# Patient Record
Sex: Female | Born: 1955 | Race: White | Hispanic: No | Marital: Married | Smoking: Current every day smoker
Health system: Southern US, Community
[De-identification: ages and names within clinical notes are randomized; demographics above are authoritative.]

## PROBLEM LIST (undated history)

## (undated) DIAGNOSIS — J449 Chronic obstructive pulmonary disease, unspecified: Secondary | ICD-10-CM

## (undated) DIAGNOSIS — F319 Bipolar disorder, unspecified: Secondary | ICD-10-CM

## (undated) DIAGNOSIS — D751 Secondary polycythemia: Secondary | ICD-10-CM

## (undated) DIAGNOSIS — M5136 Other intervertebral disc degeneration, lumbar region: Secondary | ICD-10-CM

## (undated) DIAGNOSIS — M51369 Other intervertebral disc degeneration, lumbar region without mention of lumbar back pain or lower extremity pain: Secondary | ICD-10-CM

## (undated) DIAGNOSIS — J45909 Unspecified asthma, uncomplicated: Secondary | ICD-10-CM

## (undated) DIAGNOSIS — M419 Scoliosis, unspecified: Secondary | ICD-10-CM

## (undated) DIAGNOSIS — M5020 Other cervical disc displacement, unspecified cervical region: Secondary | ICD-10-CM

## (undated) HISTORY — PX: NECK SURGERY: SHX720

## (undated) HISTORY — DX: Other intervertebral disc degeneration, lumbar region without mention of lumbar back pain or lower extremity pain: M51.369

## (undated) HISTORY — PX: DILATION AND CURETTAGE OF UTERUS: SHX78

## (undated) HISTORY — DX: Scoliosis, unspecified: M41.9

## (undated) HISTORY — DX: Secondary polycythemia: D75.1

## (undated) HISTORY — DX: Other cervical disc displacement, unspecified cervical region: M50.20

## (undated) HISTORY — PX: BREAST EXCISIONAL BIOPSY: SUR124

## (undated) HISTORY — DX: Chronic obstructive pulmonary disease, unspecified: J44.9

## (undated) HISTORY — DX: Other intervertebral disc degeneration, lumbar region: M51.36

## (undated) HISTORY — DX: Unspecified asthma, uncomplicated: J45.909

---

## 2004-12-25 ENCOUNTER — Ambulatory Visit: Payer: Self-pay | Admitting: Internal Medicine

## 2005-08-05 ENCOUNTER — Ambulatory Visit: Payer: Self-pay | Admitting: Internal Medicine

## 2005-09-16 ENCOUNTER — Ambulatory Visit: Payer: Self-pay | Admitting: Internal Medicine

## 2006-01-16 ENCOUNTER — Ambulatory Visit: Payer: Self-pay | Admitting: Neurosurgery

## 2006-05-29 ENCOUNTER — Encounter: Admission: RE | Admit: 2006-05-29 | Discharge: 2006-05-29 | Payer: Self-pay | Admitting: Neurosurgery

## 2006-06-18 ENCOUNTER — Encounter: Admission: RE | Admit: 2006-06-18 | Discharge: 2006-06-18 | Payer: Self-pay | Admitting: Neurosurgery

## 2006-10-22 ENCOUNTER — Ambulatory Visit: Payer: Self-pay | Admitting: Internal Medicine

## 2006-10-29 ENCOUNTER — Encounter: Admission: RE | Admit: 2006-10-29 | Discharge: 2006-10-29 | Payer: Self-pay | Admitting: Neurosurgery

## 2006-11-03 ENCOUNTER — Ambulatory Visit: Payer: Self-pay | Admitting: Internal Medicine

## 2006-11-12 ENCOUNTER — Ambulatory Visit: Payer: Self-pay | Admitting: Surgery

## 2007-02-08 ENCOUNTER — Ambulatory Visit: Payer: Self-pay | Admitting: Surgery

## 2007-06-03 ENCOUNTER — Ambulatory Visit: Payer: Self-pay | Admitting: Gastroenterology

## 2007-07-07 ENCOUNTER — Ambulatory Visit: Payer: Self-pay | Admitting: Gastroenterology

## 2007-10-07 ENCOUNTER — Ambulatory Visit: Payer: Self-pay | Admitting: Internal Medicine

## 2007-11-08 ENCOUNTER — Ambulatory Visit: Payer: Self-pay | Admitting: Internal Medicine

## 2007-12-02 ENCOUNTER — Ambulatory Visit: Payer: Self-pay | Admitting: Internal Medicine

## 2008-11-20 ENCOUNTER — Ambulatory Visit: Payer: Self-pay | Admitting: Internal Medicine

## 2008-12-05 ENCOUNTER — Ambulatory Visit: Payer: Self-pay | Admitting: Internal Medicine

## 2009-01-11 ENCOUNTER — Ambulatory Visit: Payer: Self-pay | Admitting: Surgery

## 2010-02-05 ENCOUNTER — Ambulatory Visit: Payer: Self-pay | Admitting: Internal Medicine

## 2010-03-05 ENCOUNTER — Ambulatory Visit: Payer: Self-pay | Admitting: Internal Medicine

## 2010-10-14 ENCOUNTER — Ambulatory Visit: Payer: Self-pay | Admitting: Internal Medicine

## 2011-03-20 ENCOUNTER — Ambulatory Visit: Payer: Self-pay | Admitting: Internal Medicine

## 2011-05-22 ENCOUNTER — Ambulatory Visit: Payer: Self-pay | Admitting: Internal Medicine

## 2012-07-06 ENCOUNTER — Ambulatory Visit: Payer: Self-pay | Admitting: Internal Medicine

## 2014-01-26 ENCOUNTER — Ambulatory Visit: Payer: Self-pay | Admitting: Internal Medicine

## 2014-01-31 ENCOUNTER — Ambulatory Visit: Payer: Self-pay | Admitting: Internal Medicine

## 2014-02-01 ENCOUNTER — Ambulatory Visit: Payer: Self-pay | Admitting: Internal Medicine

## 2014-06-02 ENCOUNTER — Ambulatory Visit: Payer: Self-pay | Admitting: Internal Medicine

## 2015-02-22 ENCOUNTER — Ambulatory Visit: Payer: Self-pay | Admitting: Internal Medicine

## 2015-09-05 ENCOUNTER — Other Ambulatory Visit: Payer: Self-pay | Admitting: Physical Medicine and Rehabilitation

## 2015-09-05 DIAGNOSIS — M5412 Radiculopathy, cervical region: Secondary | ICD-10-CM

## 2015-09-08 ENCOUNTER — Ambulatory Visit
Admission: RE | Admit: 2015-09-08 | Discharge: 2015-09-08 | Disposition: A | Discharge status: Home / Self Care | Payer: BLUE CROSS/BLUE SHIELD | Source: Ambulatory Visit | Attending: Physical Medicine and Rehabilitation | Admitting: Physical Medicine and Rehabilitation

## 2015-09-08 DIAGNOSIS — M4802 Spinal stenosis, cervical region: Secondary | ICD-10-CM | POA: Insufficient documentation

## 2015-09-08 DIAGNOSIS — M5412 Radiculopathy, cervical region: Secondary | ICD-10-CM

## 2015-09-08 DIAGNOSIS — G935 Compression of brain: Secondary | ICD-10-CM | POA: Insufficient documentation

## 2015-09-08 DIAGNOSIS — M5032 Other cervical disc degeneration, mid-cervical region: Secondary | ICD-10-CM | POA: Diagnosis not present

## 2015-09-24 DIAGNOSIS — M5030 Other cervical disc degeneration, unspecified cervical region: Secondary | ICD-10-CM | POA: Insufficient documentation

## 2015-09-24 DIAGNOSIS — M4802 Spinal stenosis, cervical region: Secondary | ICD-10-CM | POA: Insufficient documentation

## 2016-07-02 ENCOUNTER — Other Ambulatory Visit: Payer: Self-pay | Admitting: Internal Medicine

## 2016-07-02 DIAGNOSIS — Z1231 Encounter for screening mammogram for malignant neoplasm of breast: Secondary | ICD-10-CM

## 2016-07-16 ENCOUNTER — Ambulatory Visit: Payer: BLUE CROSS/BLUE SHIELD

## 2016-07-22 ENCOUNTER — Ambulatory Visit
Admission: RE | Admit: 2016-07-22 | Discharge: 2016-07-22 | Disposition: A | Discharge status: Home / Self Care | Payer: BLUE CROSS/BLUE SHIELD | Source: Ambulatory Visit | Attending: Internal Medicine | Admitting: Internal Medicine

## 2016-07-22 ENCOUNTER — Other Ambulatory Visit: Payer: Self-pay | Admitting: Internal Medicine

## 2016-07-22 DIAGNOSIS — Z1231 Encounter for screening mammogram for malignant neoplasm of breast: Secondary | ICD-10-CM | POA: Insufficient documentation

## 2016-10-30 ENCOUNTER — Ambulatory Visit (INDEPENDENT_AMBULATORY_CARE_PROVIDER_SITE_OTHER): Payer: BLUE CROSS/BLUE SHIELD | Admitting: Vascular Surgery

## 2016-11-03 ENCOUNTER — Ambulatory Visit (INDEPENDENT_AMBULATORY_CARE_PROVIDER_SITE_OTHER): Payer: BLUE CROSS/BLUE SHIELD | Admitting: Vascular Surgery

## 2016-11-03 ENCOUNTER — Encounter (INDEPENDENT_AMBULATORY_CARE_PROVIDER_SITE_OTHER): Payer: Self-pay | Admitting: Vascular Surgery

## 2016-11-03 DIAGNOSIS — J431 Panlobular emphysema: Secondary | ICD-10-CM | POA: Diagnosis not present

## 2016-11-03 DIAGNOSIS — I872 Venous insufficiency (chronic) (peripheral): Secondary | ICD-10-CM | POA: Insufficient documentation

## 2016-11-03 DIAGNOSIS — J449 Chronic obstructive pulmonary disease, unspecified: Secondary | ICD-10-CM

## 2016-11-03 DIAGNOSIS — I83811 Varicose veins of right lower extremities with pain: Secondary | ICD-10-CM | POA: Diagnosis not present

## 2016-11-03 DIAGNOSIS — I83819 Varicose veins of unspecified lower extremities with pain: Secondary | ICD-10-CM | POA: Insufficient documentation

## 2016-11-03 HISTORY — DX: Chronic obstructive pulmonary disease, unspecified: J44.9

## 2016-11-03 NOTE — Progress Notes (Signed)
[facility] Admission History & Physical  MRN : [id number]  Yvonne Pitts is a 60 y.o. (02/16/1956) female who presents with chief complaint of  Chief Complaint  Patient presents with  . Follow-up  .  History of Present Illness: The patient returns for followup evaluation 3 months after the initial visit. The patient continues to have pain in the lower extremities with dependency. The pain is lessened with elevation. Graduated compression stockings, Class I (20-30 mmHg), have been worn but the stockings do not eliminate the leg pain. Over-the-counter analgesics do not improve the symptoms. The degree of discomfort continues to interfere with daily activities. The patient notes the pain in the legs is causing problems with daily exercise, at the workplace and even with household activities and maintenance such as standing in the kitchen preparing meals and doing dishes.   Venous ultrasound shows normal deep venous system, no evidence of acute or chronic DVT.  Superficial reflux is present in a large posterior vein that appear to connect to the small saphenous vein on the right  Current Meds  Medication Sig  . acetaminophen (TYLENOL) 500 MG tablet Take by mouth.  . ADVAIR DISKUS 250-50 MCG/DOSE AEPB   . fluticasone (FLONASE) 50 MCG/ACT nasal spray Place into the nose.  Marland Kitchen Ipratropium-Albuterol (COMBIVENT) 20-100 MCG/ACT AERS respimat Inhale into the lungs.  . montelukast (SINGULAIR) 10 MG tablet Take 10 mg by mouth at bedtime.  . montelukast (SINGULAIR) 10 MG tablet TAKE 1 TABLET DAILY  . naproxen sodium (ANAPROX) 220 MG tablet Take by mouth.  Marland Kitchen omeprazole (PRILOSEC) 20 MG capsule Take by mouth.  . Vitamin D, Ergocalciferol, (DRISDOL) 50000 units CAPS capsule Take by mouth.    Past Medical History:  Diagnosis Date  . Asthma   . COPD (chronic obstructive pulmonary disease) ([place]) 11/03/2016    Past Surgical History:  Procedure Laterality Date  . BREAST BIOPSY  Right years ago   x 2  . DILATION AND CURETTAGE OF UTERUS    . NECK SURGERY      Social History Social History  Substance Use Topics  . Smoking status: Current Every Day Smoker  . Smokeless tobacco: Never Used  . Alcohol use No    Family History Family History  Problem Relation Age of Onset  . Breast cancer Maternal Aunt 40  No family history of bleeding/clotting disorders, porphyria or autoimmune disease   Allergies  Allergen Reactions  . Prochlorperazine Maleate     Other reaction(s): Unknown  . Ibuprofen Rash     REVIEW OF SYSTEMS (Negative unless checked)  Constitutional: [] Weight loss  [] Fever  [] Chills Cardiac: [] Chest pain   [] Chest pressure   [] Palpitations   [] Shortness of breath when laying flat   [] Shortness of breath with exertion. Vascular:  [x] Pain in legs with walking   [] Pain in legs at rest  [] History of DVT   [] Phlebitis   [x] Swelling in legs   [x] Varicose veins   [] Non-healing ulcers Pulmonary:   [] Uses home oxygen   [] Productive cough   [] Hemoptysis   [] Wheeze  [] COPD   [] Asthma Neurologic:  [] Dizziness   [] Seizures   [] History of stroke   [] History of TIA  [] Aphasia   [] Vissual changes   [] Weakness or numbness in arm   [] Weakness or numbness in leg Musculoskeletal:   [] Joint swelling   [] Joint pain   [] Low back pain Hematologic:  [] Easy bruising  [] Easy bleeding   [] Hypercoagulable state   [] Anemic Gastrointestinal:  [] Diarrhea   []   Vomiting  [] Gastroesophageal reflux/heartburn   [] Difficulty swallowing. Genitourinary:  [] Chronic kidney disease   [] Difficult urination  [] Frequent urination   [] Blood in urine Skin:  [] Rashes   [] Ulcers  Psychological:  [] History of anxiety   []  History of major depression.  Physical Examination  Vitals:   11/03/16 1500  BP: 116/66  Pulse: 81  Resp: 17  Weight: 157 lb (71.2 kg)  Height: 5\' 5"  (1.651 m)   Body mass index is 26.13 kg/m. Gen: WD/WN, NAD Head: [place]/AT, No temporalis wasting.  Ear/Nose/Throat:  Hearing grossly intact, nares w/o erythema or drainage, poor dentition Eyes: PER, EOMI, sclera nonicteric.  Neck: Supple, no masses.  No bruit or JVD.  Pulmonary:  Good air movement, clear to auscultation bilaterally, no use of accessory muscles.  Cardiac: RRR, normal S1, S2, no Murmurs. Vascular: large >1.0 mm varicosity extending from the popliteal fossa to the gluteal fold.  Moderate venous stasis changes of the the right ankle area 2+ edema bilaterally. Vessel Right Left  Radial Palpable Palpable  Ulnar Palpable Palpable  Brachial Palpable Palpable  Carotid Palpable Palpable  Femoral Palpable Palpable  Popliteal Palpable Palpable  PT Palpable Palpable  DP Palpable Palpable   Gastrointestinal: soft, non-distended. No guarding/no peritoneal signs.  Musculoskeletal: M/S 5/5 throughout.  No deformity or atrophy.  Neurologic: CN 2-12 intact. Pain and light touch intact in extremities.  Symmetrical.  Speech is fluent. Motor exam as listed above. Psychiatric: Judgment intact, Mood & affect appropriate for pt's clinical situation. Dermatologic: No rashes or ulcers noted.  No changes consistent with cellulitis. Lymph : No Cervical lymphadenopathy, no lichenification or skin changes of chronic lymphedema.  CBC No results found for: WBC, HGB, HCT, MCV, PLT  BMET No results found for: NA, K, CL, CO2, GLUCOSE, BUN, CREATININE, CALCIUM, GFRNONAA, GFRAA CrCl cannot be calculated (No order found.).  COAG No results found for: INR, PROTIME  Radiology No results found.  Assessment/Plan 1. Varicose veins of right lower extremity with pain Recommend  I have reviewed my previous  discussion with the patient regarding  varicose veins and why they cause symptoms. Patient will continue  wearing graduated compression stockings class 1 on a daily basis, beginning first thing in the morning and removing them in the evening.    In addition, behavioral modification including elevation during the day  was again discussed and this will continue.  The patient has utilized over the counter pain medications and has been exercising.  However, at this time conservative therapy has not alleviated the patient's symptoms of leg pain and swelling  Recommend: laser ablation of the right posterior varicose vein to eliminate the symptoms of pain and swelling of the lower extremities caused by the severe superficial venous reflux disease.   2. Chronic venous insufficiency contnue compression  3. Panlobular emphysema ([place]) Continue aerosol medications as already ordered and reviewed, no changes at this time.     Hortencia Pilar, MD  11/03/2016 5:06 PM

## 2017-09-01 ENCOUNTER — Other Ambulatory Visit: Payer: Self-pay | Admitting: Internal Medicine

## 2017-09-01 DIAGNOSIS — Z1231 Encounter for screening mammogram for malignant neoplasm of breast: Secondary | ICD-10-CM

## 2017-10-01 ENCOUNTER — Ambulatory Visit
Admission: RE | Admit: 2017-10-01 | Discharge: 2017-10-01 | Disposition: A | Discharge status: Home / Self Care | Payer: BLUE CROSS/BLUE SHIELD | Source: Ambulatory Visit | Attending: Internal Medicine | Admitting: Internal Medicine

## 2017-10-01 DIAGNOSIS — Z1231 Encounter for screening mammogram for malignant neoplasm of breast: Secondary | ICD-10-CM | POA: Insufficient documentation

## 2018-10-19 ENCOUNTER — Telehealth: Payer: Self-pay | Admitting: *Deleted

## 2018-10-19 ENCOUNTER — Encounter: Payer: Self-pay | Admitting: *Deleted

## 2018-10-19 DIAGNOSIS — Z122 Encounter for screening for malignant neoplasm of respiratory organs: Secondary | ICD-10-CM

## 2018-10-19 NOTE — Telephone Encounter (Signed)
Received a referral for initial lung cancer screening scan.  Contacted the patient and obtained their smoking history, current smoker 1.75-2ppd with 75.25pkyr history   as well as answering questions related to screening process.  Patient denies signs of lung cancer such as weight loss or hemoptysis at this time.  Patient denies comorbidity that would prevent curative treatment if lung cancer were found.  Patient is scheduled for the Shared Decision Making Visit and CT scan on 11-18-18@1330  .

## 2018-11-18 ENCOUNTER — Inpatient Hospital Stay: Payer: BLUE CROSS/BLUE SHIELD | Attending: Nurse Practitioner | Admitting: Nurse Practitioner

## 2018-11-18 ENCOUNTER — Ambulatory Visit
Admission: RE | Admit: 2018-11-18 | Discharge: 2018-11-18 | Disposition: A | Discharge status: Home / Self Care | Payer: BLUE CROSS/BLUE SHIELD | Source: Ambulatory Visit | Attending: Nurse Practitioner | Admitting: Nurse Practitioner

## 2018-11-18 DIAGNOSIS — J432 Centrilobular emphysema: Secondary | ICD-10-CM | POA: Diagnosis not present

## 2018-11-18 DIAGNOSIS — F17210 Nicotine dependence, cigarettes, uncomplicated: Secondary | ICD-10-CM | POA: Diagnosis not present

## 2018-11-18 DIAGNOSIS — Z122 Encounter for screening for malignant neoplasm of respiratory organs: Secondary | ICD-10-CM | POA: Insufficient documentation

## 2018-11-18 DIAGNOSIS — I700 Atherosclerosis of aorta: Secondary | ICD-10-CM | POA: Diagnosis not present

## 2018-11-18 DIAGNOSIS — R918 Other nonspecific abnormal finding of lung field: Secondary | ICD-10-CM | POA: Insufficient documentation

## 2018-11-18 DIAGNOSIS — J9819 Other pulmonary collapse: Secondary | ICD-10-CM | POA: Insufficient documentation

## 2018-11-18 DIAGNOSIS — J438 Other emphysema: Secondary | ICD-10-CM | POA: Insufficient documentation

## 2018-11-18 DIAGNOSIS — Z87891 Personal history of nicotine dependence: Secondary | ICD-10-CM

## 2018-11-18 NOTE — Progress Notes (Signed)
In accordance with CMS guidelines, patient has met eligibility criteria including age, absence of signs or symptoms of lung cancer.  Social History   Tobacco Use  . Smoking status: Current Every Day Smoker    Packs/day: 1.75    Years: 43.00    Pack years: 75.25  . Smokeless tobacco: Never Used  Substance Use Topics  . Alcohol use: No  . Drug use: No      A shared decision-making session was conducted prior to the performance of CT scan. This includes one or more decision aids, includes benefits and harms of screening, follow-up diagnostic testing, over-diagnosis, false positive rate, and total radiation exposure.   Counseling on the importance of adherence to annual lung cancer LDCT screening, impact of co-morbidities, and ability or willingness to undergo diagnosis and treatment is imperative for compliance of the program.   Counseling on the importance of continued smoking cessation for former smokers; the importance of smoking cessation for current smokers, and information about tobacco cessation interventions have been given to patient including [place] and [facility] quit [place] programs.   Written order for lung cancer screening with LDCT has been given to the patient and any and all questions have been answered to the best of my abilities.    Yearly follow up will be coordinated by Burgess Estelle, Thoracic Navigator.  Beckey Rutter, DNP, AGNP-C [place] [facility] [phone] (work cell) [phone] (office) 11/18/18 2:54 PM

## 2018-11-19 ENCOUNTER — Ambulatory Visit
Admission: RE | Admit: 2018-11-19 | Discharge: 2018-11-19 | Disposition: A | Discharge status: Home / Self Care | Payer: BLUE CROSS/BLUE SHIELD | Source: Ambulatory Visit | Attending: Nurse Practitioner | Admitting: Nurse Practitioner

## 2018-11-19 ENCOUNTER — Other Ambulatory Visit: Payer: Self-pay | Admitting: Nurse Practitioner

## 2018-11-19 DIAGNOSIS — J939 Pneumothorax, unspecified: Secondary | ICD-10-CM

## 2018-11-19 LAB — POCT I-STAT CREATININE: CREATININE: 0.6 mg/dL (ref 0.44–1.00)

## 2018-11-19 MED ORDER — IOPAMIDOL (ISOVUE-300) INJECTION 61%
75.0000 mL | Freq: Once | INTRAVENOUS | Status: AC | PRN
  Administered 2018-11-19: 65 mL via INTRAVENOUS

## 2018-11-19 NOTE — Progress Notes (Signed)
Patient seen in clinic on 11/18/18 for low dose ct lung screening which was reported as:   IMPRESSION: 1. Lung-RADS 0, incomplete. Right middle lobe cannot be assessed due to complete right middle lobe collapse. Right middle lobe bronchus is obstructed. Immediate follow-up chest CT with contrast recommended to assess for central obstructing lesion. Repeat lung cancer screening CT could be performed if therapy to re-expand right middle lobe is successful. 2. Areas of small airway impaction in the right upper and both lower lobes. The right lower lobe disease is associated with patchy ground-glass and nodular airspace disease. This may be infectious/inflammatory and in the appropriate clinical setting, aspiration could have this appearance. 3.  Aortic Atherosclerois (ICD10-170.0) 4.  Emphysema. (CYE18-H90.9)  Dr. Linton Ham office notified of finding at that given that Dr. Ginette Pitman is out of town I've placed order for CT Chest w/ contrast. Burgess Estelle working on Government social research officer. Kidney function to be checked at imaging center per protocol prior to scan. I'll call patient to discuss finding and recommended follow-up.

## 2018-11-22 ENCOUNTER — Telehealth: Payer: Self-pay | Admitting: *Deleted

## 2018-11-22 NOTE — Telephone Encounter (Signed)
Left message at PCP office for PCP or covering provider to review CT scan of chest and advise if PCP or [place] provider will make pulmonology referral.

## 2018-11-23 ENCOUNTER — Other Ambulatory Visit: Payer: Self-pay | Admitting: Nurse Practitioner

## 2018-11-23 ENCOUNTER — Telehealth: Payer: Self-pay | Admitting: *Deleted

## 2018-11-23 DIAGNOSIS — J9819 Other pulmonary collapse: Secondary | ICD-10-CM

## 2018-11-23 NOTE — Telephone Encounter (Signed)
Spoke with Sharyn Lull, at [facility] who is working with Nani Gasser, covering provider for Dr. Ginette Pitman. They would like for [place] providers to make referral to pulmonology and are aware that will be [facility] practice with anticipated need for interventional pulmonology.

## 2018-11-23 NOTE — Progress Notes (Signed)
Referral to pulmonology placed for evaluation and possible bronchoscopy.

## 2018-11-24 ENCOUNTER — Telehealth: Payer: Self-pay

## 2018-11-24 NOTE — Telephone Encounter (Signed)
LM for patient to call and schedule apt with Dr. Patsey Berthold per cancer center.

## 2018-11-24 NOTE — Telephone Encounter (Signed)
Spoke with patient, scheduled apt.

## 2018-11-30 ENCOUNTER — Encounter: Payer: Self-pay | Admitting: Pulmonary Disease

## 2018-11-30 ENCOUNTER — Ambulatory Visit (INDEPENDENT_AMBULATORY_CARE_PROVIDER_SITE_OTHER): Payer: BLUE CROSS/BLUE SHIELD | Admitting: Pulmonary Disease

## 2018-11-30 VITALS — BP 130/80 | HR 67 | Ht 64.0 in | Wt 131.4 lb

## 2018-11-30 DIAGNOSIS — J9811 Atelectasis: Secondary | ICD-10-CM | POA: Diagnosis not present

## 2018-11-30 DIAGNOSIS — F4321 Adjustment disorder with depressed mood: Secondary | ICD-10-CM | POA: Diagnosis not present

## 2018-11-30 DIAGNOSIS — F17210 Nicotine dependence, cigarettes, uncomplicated: Secondary | ICD-10-CM | POA: Diagnosis not present

## 2018-11-30 DIAGNOSIS — J449 Chronic obstructive pulmonary disease, unspecified: Secondary | ICD-10-CM

## 2018-11-30 DIAGNOSIS — Z716 Tobacco abuse counseling: Secondary | ICD-10-CM

## 2018-11-30 MED ORDER — FLUTTER DEVI: 0 refills | Status: DC

## 2018-11-30 NOTE — Patient Instructions (Signed)
1) Use flutter valve 3 to 4 times a day.  2) Rinse mouth well after Advair use.  3) You will be scheduled for breathing test.  4) You will be scheduled for a chest x-ray on your day of follow-up.  We will see you in follow-up in 3 to 4 weeks time.

## 2018-11-30 NOTE — Progress Notes (Signed)
Subjective:    Patient ID: Yvonne Pitts, female    DOB: 1956/04/20, 62 y.o.   MRN: [id number]  HPI The patient is a 62 year old current smoker who presents for evaluation of a abnormality noted on lung cancer screening chest CT.  She is referred by the lung cancer screening program.  Her primary care physician is Dr. Ginette Pitman.  The patient underwent lung cancer screening CT on 18 November 2018.  That was followed by a CT with contrast the following day due to right middle lobe atelectasis and collapse.  There appears to be mucus material obstructing.  No distinct lesion could be seen.  I have reviewed both films independently.  The patient did not exhibit adenopathy.  There is significant emphysema (centrilobular) noted on the films.  The patient has noted cough which has been productive for "years".  She notes that the sputum can be tenacious at times and at times shows mucous plugs.  The sputum can also be green in color at times.  She has never noticed hemoptysis.  She states that she has [facility] evaluated [facility] but she cannot [facility] who.  This was many years ago when she was started on Advair at that time.  She uses Combivent as rescue.  She feels that the medications help her with her cough and dyspnea.  She is aware that smoking aggravates her symptoms but she feels that she cannot quit this habit.  She does not report any weight loss or anorexia.  She used to have issues with gastroesophageal reflux but these are better with the use of Prilosec and taking a fiber supplement.  Sometimes feels that she chokes while eating but has not had true dysphagia.  This is not a common occurrence.  She states that she always keeps a sore throat but does admit that she does not rinse her mouth well after use of Advair.  I have reviewed the patient's past medical history, surgical history and family history.  As noted she is a smoker she has smoked since age 76  anywhere between 1 to 2 packs of cigarettes per day and continues to do so.  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  She has a facial needs son who has autism and she tends to him which causes a great deal of stress.  She does feel overwhelmed, anxious and depressed many times.   At present she would want to defer any potential bronchoscopic interventions until after the holidays.   Review of Systems  Constitutional: Negative.   HENT: Positive for postnasal drip, sinus pressure, sore throat and trouble swallowing.   Eyes: Negative.   Respiratory: Positive for cough and shortness of breath.   Cardiovascular: Positive for palpitations.  Gastrointestinal:       Gastroesophageal reflux symptoms controlled with Prilosec and fiber supplement.  Endocrine: Negative.   Genitourinary: Negative.   Musculoskeletal: Positive for joint swelling.  Skin: Negative.   Allergic/Immunologic: Negative.   Neurological: Negative.   Hematological: Negative.   Psychiatric/Behavioral: Negative.   All other systems reviewed and are negative.      Objective:   Physical Exam  Constitutional: She is oriented to person, place, and time. She appears well-developed and well-nourished. No distress.  She appears older than stated age.  HENT:  Head: Normocephalic and atraumatic.  Right Ear: External ear normal.  Left Ear: External  ear normal.  Poor dentition.  Eyes: Pupils are equal, round, and reactive to light. Conjunctivae and EOM are normal. No scleral icterus.  Neck: Neck supple. No JVD present. No tracheal deviation present. No thyromegaly present.  Cardiovascular: Normal rate, regular rhythm, normal heart sounds and intact distal pulses.  Pulmonary/Chest: Effort normal. No accessory muscle usage. She has decreased breath sounds (Diffusely diminished breath sounds throughout). She has no wheezes. She has no rales.  Abdominal:  Soft. Bowel sounds are normal. She exhibits no distension.  Musculoskeletal: Normal range of motion. She exhibits no edema.  Lymphadenopathy:    She has no cervical adenopathy.  Neurological: She is alert and oriented to person, place, and time.  No focal deficits.  Skin: Skin is warm and dry. No rash noted. She is not diaphoretic. No erythema. No pallor.  Psychiatric: Her speech is normal. Thought content normal. Her affect is labile. Cognition and memory are normal. She exhibits a depressed mood.  Nursing note and vitals reviewed.   I reviewed the available radiographic films independently and reviewed them also with the patient.     Assessment & Plan:   1.  COPD with chronic bronchitis and emphysema: The patient describes findings consistent with chronic bronchitis that is production of mucopurulent sputum.  She also has a prior indication of potential asthma/COPD overlap which can lead to mucus plugging.  By her description sometimes her sputum shows plugs in it.  The findings noted on CT scan appear to be related to potential mucus plugging.  We will proceed with providing her an Acapella flutter valve with instructions to use 3-4 times a day to help with mucociliary clearance.  He was instructed on the proper use of her inhalers including rinsing well after use of Advair.  She was also instructed on curtailing her tobacco use and engage in smoking cessation as this will help with secretion clearance.  We will see her in follow-up in 4 weeks time at her request (did not want to follow sooner) with a chest x-ray at that time.  If there is still evidence of atelectasis on the right middle lobe we will proceed with bronchoscopy, she is aware of this.  2.  Atelectasis of the right lung: The patient has right middle lobe collapse related to apparent mucous plugging.  We will need to continue to follow this as potentially central obstructing lesion cannot be ruled out.  As noted we will follow-up  with chest x-ray and if the atelectasis persists, we will proceed with bronchoscopy at that time.  At the patient's request we will do this after the first of the year.  3.  Tobacco dependence due to cigarettes: The patient was counseled regards to discontinuation of smoking.  She cites that she has tried numerous modalities to stop smoking however she feels overwhelmed by her current stressors in her life and at present has no motivation to quit.  Total counseling time 3 to 5 minutes.  4.  Situational depression: The patient significant stressors in her life that caused her to be depressed.  Her mood was somewhat labile in today.  She may benefit from antidepressant this in turn may help with having the patient engage in smoking cessation.  I will leave this at the discretion of her primary care physician.  Given the issues with depression I have recommended that she discontinue use of montelukast which can aggravate depression.  Thank you for allowing me to participate in this patient's care.

## 2018-12-23 ENCOUNTER — Ambulatory Visit: Payer: BLUE CROSS/BLUE SHIELD | Admitting: Pulmonary Disease

## 2018-12-23 ENCOUNTER — Ambulatory Visit: Payer: BLUE CROSS/BLUE SHIELD | Attending: Pulmonary Disease

## 2018-12-23 DIAGNOSIS — J449 Chronic obstructive pulmonary disease, unspecified: Secondary | ICD-10-CM | POA: Diagnosis present

## 2018-12-23 DIAGNOSIS — J9811 Atelectasis: Secondary | ICD-10-CM | POA: Insufficient documentation

## 2018-12-23 MED ORDER — ALBUTEROL SULFATE (2.5 MG/3ML) 0.083% IN NEBU
2.5000 mg | INHALATION_SOLUTION | Freq: Once | RESPIRATORY_TRACT | Status: AC
  Administered 2018-12-23: 2.5 mg via RESPIRATORY_TRACT
  Filled 2018-12-23: qty 3

## 2018-12-31 ENCOUNTER — Ambulatory Visit
Admission: RE | Admit: 2018-12-31 | Discharge: 2018-12-31 | Disposition: A | Discharge status: Home / Self Care | Payer: BLUE CROSS/BLUE SHIELD | Source: Ambulatory Visit | Attending: Pulmonary Disease | Admitting: Pulmonary Disease

## 2018-12-31 ENCOUNTER — Encounter: Payer: Self-pay | Admitting: Pulmonary Disease

## 2018-12-31 ENCOUNTER — Ambulatory Visit
Admission: RE | Admit: 2018-12-31 | Discharge: 2018-12-31 | Disposition: A | Discharge status: Home / Self Care | Payer: BLUE CROSS/BLUE SHIELD | Attending: Pulmonary Disease | Admitting: Pulmonary Disease

## 2018-12-31 ENCOUNTER — Ambulatory Visit (INDEPENDENT_AMBULATORY_CARE_PROVIDER_SITE_OTHER): Payer: BLUE CROSS/BLUE SHIELD | Admitting: Pulmonary Disease

## 2018-12-31 VITALS — BP 130/80 | HR 70 | Ht 64.0 in | Wt 131.0 lb

## 2018-12-31 DIAGNOSIS — J9811 Atelectasis: Secondary | ICD-10-CM | POA: Diagnosis not present

## 2018-12-31 DIAGNOSIS — J449 Chronic obstructive pulmonary disease, unspecified: Secondary | ICD-10-CM

## 2018-12-31 DIAGNOSIS — F4321 Adjustment disorder with depressed mood: Secondary | ICD-10-CM | POA: Diagnosis not present

## 2018-12-31 DIAGNOSIS — F17210 Nicotine dependence, cigarettes, uncomplicated: Secondary | ICD-10-CM | POA: Diagnosis not present

## 2018-12-31 MED ORDER — ALBUTEROL SULFATE 108 (90 BASE) MCG/ACT IN AEPB: 2.0000 | INHALATION_SPRAY | Freq: Four times a day (QID) | RESPIRATORY_TRACT | 3 refills | Status: AC | PRN

## 2018-12-31 MED ORDER — FLUTICASONE-UMECLIDIN-VILANT 100-62.5-25 MCG/INH IN AEPB: 1.0000 | INHALATION_SPRAY | Freq: Every day | RESPIRATORY_TRACT | 3 refills | Status: DC

## 2018-12-31 MED ORDER — FLUTICASONE-UMECLIDIN-VILANT 100-62.5-25 MCG/INH IN AEPB: 1.0000 | INHALATION_SPRAY | Freq: Every day | RESPIRATORY_TRACT | 0 refills | Status: DC

## 2018-12-31 NOTE — Progress Notes (Signed)
Subjective:    Patient ID: Yvonne Pitts, female    DOB: February 07, 1956, 63 y.o.   MRN: [id number]  HPI The patient is a 63 year old current smoker (1 PPD) who presents for follow-up of a abnormality noted on lung cancer screening chest CT. The patient underwent lung cancer screening CT on 18 November 2018.  That was followed by a CT with contrast the following day due to right middle lobe atelectasis and collapse.  She was evaluated here on 3 December for these issues.  Her chest CTs were reviewed and showed mucus material obstructing the right middle lobe bronchus. The patient did not exhibit adenopathy.  There is significant emphysema (centrilobular) noted on the films.  The patient has noted cough which has been productive for "years".  She notes that the sputum can be tenacious at times and at times shows mucous plugs.  The sputum can also be green in color at times.  She has never noticed hemoptysis.  She states that she has been evaluated by a pulmonologist previously through the [facility] but she cannot remember who. She is aware that smoking aggravates her symptoms but she feels that she cannot quit this habit.  She does not report any weight loss or anorexia.  She used to have issues with gastroesophageal reflux but these are better with the use of Prilosec and taking a fiber supplement.  Sometimes feels that she chokes while eating but has not had true dysphagia.  This is not a common occurrence.    At our initial evaluation we placed her on a flutter valve to help with mucociliary clearance and requested that she follow-up with a chest x-ray today which she had performed.  The chest x-ray is entirely clear.  The atelectasis on the right middle lobe has resolved.  The patient has stopped using the flutter valve and has noted mucus building up again.  She had pulmonary function testing performed on 26 December that is consistent with moderate airway obstruction, no significant bronchodilator  response mild hyperinflation and mild diffusion capacity impairment.  This is consistent with mild to moderate COPD.  Suspect she has mixed physiology of chronic bronchitis and emphysema.  With regards to her Advair and Combivent, she feels that the Advair is "okay" but feels that the medication could be stronger.  She is cognizant she needs to quit smoking but as noted above she feels she cannot do this at present.  Review of Systems  Constitutional: Negative.   HENT: Negative.   Eyes: Negative.   Respiratory: Positive for cough.   Cardiovascular: Negative.   Gastrointestinal: Negative.   Endocrine: Negative.   Genitourinary: Negative.   Musculoskeletal: Negative.   Skin: Negative.   Allergic/Immunologic: Negative.   Neurological: Negative.   Hematological: Negative.   Psychiatric/Behavioral: Negative.   All other systems reviewed and are negative.      Objective:   Physical Exam Constitutional:      General: She is not in acute distress.    Appearance: She is well-developed and normal weight. She is not ill-appearing.  HENT:     Head: Normocephalic and atraumatic.  Eyes:     Extraocular Movements: Extraocular movements intact.     Pupils: Pupils are equal, round, and reactive to light.  Neck:     Musculoskeletal: Neck supple.     Thyroid: No thyromegaly.     Vascular: No JVD.     Trachea: No tracheal deviation.  Cardiovascular:     Rate and Rhythm: Normal  rate and regular rhythm.     Heart sounds: Normal heart sounds. No murmur.  Pulmonary:     Effort: Pulmonary effort is normal. No accessory muscle usage.     Breath sounds: No decreased breath sounds, wheezing, rhonchi or rales.     Comments: Coarse breath sounds throughout. Chest:     Chest wall: No crepitus.  Abdominal:     General: Bowel sounds are normal.     Palpations: Abdomen is soft.  Musculoskeletal:     Right lower leg: No edema.     Left lower leg: No edema.  Lymphadenopathy:     Cervical: No  cervical adenopathy.  Neurological:     General: No focal deficit present.     Mental Status: She is alert and oriented to person, place, and time.  Psychiatric:        Mood and Affect: Mood normal.        Behavior: Behavior normal.           Assessment & Plan:  1.  COPD with chronic bronchitis and emphysema: The patient describes findings consistent with chronic bronchitis that is production of mucopurulent sputum.  She also has a prior indication of potential asthma/COPD overlap which can lead to mucus plugging.  By her description sometimes her sputum shows plugs in it.  The findings noted on CT scan appear to be related to potential mucus plugging.    She has cleared atelectasis noted on the right middle lobe by chest x-ray today.  This was done with pulmonary toilet with flutter valve.  Is encouraged to keep using it periodically.  Definitely use it when her sputum becomes thick.  She has mixed COPD with chronic bronchitis and emphysema.  She has noted that Advair has lost efficacy.  We will switch her to Trelegy Ellipta 1 inhalation daily and Pro-Air (albuterol) for rescue.  She was provided a sample for the Trelegy and instruction for both the Trelegy dry powder inhaler and the Pro-Air Respiclick device.  We will see her in follow-up in 3 months time she is to contact us prior to that time should any new difficulties arise.  2.  Atelectasis of the right lung: The patient had right middle lobe collapse related to mucous plugging.  Chest x-ray today shows resolution of this finding.  Recommend yearly low-dose CT scan for lung cancer screening.    3.  Tobacco dependence due to cigarettes: The patient was counseled regards to discontinuation of smoking.  She cites that she has tried numerous modalities to stop smoking however she feels overwhelmed by her current stressors in her life and at present has no motivation to quit.  Total counseling time 3 to 5 minutes.  4.  Situational depression:  This issue has improved with discontinuing montelukast.   We will see her in follow-up in 3 months time she is to contact us prior to that time should any new difficulties arise.

## 2018-12-31 NOTE — Patient Instructions (Signed)
1.  Stop Advair and Combivent.  2.  You will be placed on Trelegy Ellipta 1 inhalation daily.  Rinse your mouth well after use.  3.  Your new rescue inhaler will be ProAir Respiclick, 2 inhalations every 6 hours ONLY IF NEEDED for shortness of breath.  4.  We will see you in follow-up in 3 months time call sooner than that should you have any new problems with your breathing.

## 2019-01-20 ENCOUNTER — Other Ambulatory Visit: Payer: Self-pay | Admitting: Internal Medicine

## 2019-01-20 DIAGNOSIS — Z1231 Encounter for screening mammogram for malignant neoplasm of breast: Secondary | ICD-10-CM

## 2019-02-08 ENCOUNTER — Ambulatory Visit
Admission: RE | Admit: 2019-02-08 | Discharge: 2019-02-08 | Disposition: A | Discharge status: Home / Self Care | Payer: BLUE CROSS/BLUE SHIELD | Source: Ambulatory Visit | Attending: Internal Medicine | Admitting: Internal Medicine

## 2019-02-08 DIAGNOSIS — Z1231 Encounter for screening mammogram for malignant neoplasm of breast: Secondary | ICD-10-CM | POA: Diagnosis not present

## 2019-03-20 ENCOUNTER — Encounter: Payer: Self-pay | Admitting: *Deleted

## 2019-03-22 DIAGNOSIS — J432 Centrilobular emphysema: Secondary | ICD-10-CM | POA: Insufficient documentation

## 2019-08-16 ENCOUNTER — Other Ambulatory Visit: Payer: Self-pay

## 2019-08-17 ENCOUNTER — Encounter (INDEPENDENT_AMBULATORY_CARE_PROVIDER_SITE_OTHER): Payer: Self-pay

## 2019-08-17 ENCOUNTER — Other Ambulatory Visit: Payer: Self-pay

## 2019-08-17 ENCOUNTER — Inpatient Hospital Stay: Payer: BC Managed Care – PPO

## 2019-08-17 ENCOUNTER — Inpatient Hospital Stay: Payer: BC Managed Care – PPO | Attending: Oncology | Admitting: Oncology

## 2019-08-17 ENCOUNTER — Encounter: Payer: Self-pay | Admitting: Oncology

## 2019-08-17 VITALS — BP 148/75 | HR 58 | Temp 95.1°F | Resp 18 | Ht 64.6 in | Wt 140.0 lb

## 2019-08-17 DIAGNOSIS — Z87891 Personal history of nicotine dependence: Secondary | ICD-10-CM | POA: Diagnosis not present

## 2019-08-17 DIAGNOSIS — M5136 Other intervertebral disc degeneration, lumbar region: Secondary | ICD-10-CM | POA: Diagnosis not present

## 2019-08-17 DIAGNOSIS — Z79899 Other long term (current) drug therapy: Secondary | ICD-10-CM | POA: Diagnosis not present

## 2019-08-17 DIAGNOSIS — D751 Secondary polycythemia: Secondary | ICD-10-CM | POA: Diagnosis present

## 2019-08-17 DIAGNOSIS — Z7982 Long term (current) use of aspirin: Secondary | ICD-10-CM | POA: Insufficient documentation

## 2019-08-17 DIAGNOSIS — F17210 Nicotine dependence, cigarettes, uncomplicated: Secondary | ICD-10-CM | POA: Insufficient documentation

## 2019-08-17 DIAGNOSIS — J449 Chronic obstructive pulmonary disease, unspecified: Secondary | ICD-10-CM | POA: Insufficient documentation

## 2019-08-17 LAB — BASIC METABOLIC PANEL
Anion gap: 8 (ref 5–15)
BUN: 8 mg/dL (ref 8–23)
CO2: 28 mmol/L (ref 22–32)
Calcium: 9.7 mg/dL (ref 8.9–10.3)
Chloride: 104 mmol/L (ref 98–111)
Creatinine, Ser: 0.56 mg/dL (ref 0.44–1.00)
GFR calc Af Amer: >60 mL/min (ref >60)
GFR calc non Af Amer: >60 mL/min (ref >60)
Glucose, Bld: 104 mg/dL — ABNORMAL HIGH (ref 70–99)
Potassium: 4.2 mmol/L (ref 3.5–5.1)
Sodium: 140 mmol/L (ref 135–145)

## 2019-08-17 LAB — CBC WITH DIFFERENTIAL/PLATELET
Abs Immature Granulocytes: 0.03 10*3/uL (ref 0.00–0.07)
Basophils Absolute: 0.1 10*3/uL (ref 0.0–0.1)
Basophils Relative: 1 %
Eosinophils Absolute: 0.2 10*3/uL (ref 0.0–0.5)
Eosinophils Relative: 2 %
HCT: 44.3 % (ref 36.0–46.0)
Hemoglobin: 14.6 g/dL (ref 12.0–15.0)
Immature Granulocytes: 0 %
Lymphocytes Relative: 23 %
Lymphs Abs: 2.2 10*3/uL (ref 0.7–4.0)
MCH: 31.3 pg (ref 26.0–34.0)
MCHC: 33 g/dL (ref 30.0–36.0)
MCV: 95.1 fL (ref 80.0–100.0)
Monocytes Absolute: 0.5 10*3/uL (ref 0.1–1.0)
Monocytes Relative: 5 %
Neutro Abs: 6.5 10*3/uL (ref 1.7–7.7)
Neutrophils Relative %: 69 %
Platelets: 420 10*3/uL — ABNORMAL HIGH (ref 150–400)
RBC: 4.66 MIL/uL (ref 3.87–5.11)
RDW: 13.1 % (ref 11.5–15.5)
WBC: 9.4 10*3/uL (ref 4.0–10.5)
nRBC: 0 % (ref 0.0–0.2)

## 2019-08-17 NOTE — Progress Notes (Signed)
Hematology/Oncology Consult note [facility] Telephone:([phone] Fax:([phone]   Patient Care Team: Tracie Harrier, MD as PCP - General (Internal Medicine)  REFERRING PROVIDER: Tracie Harrier, MD  CHIEF COMPLAINTS/REASON FOR VISIT:  Evaluation of polycythemia  HISTORY OF PRESENTING ILLNESS:  Yvonne Pitts is a 63 y.o. female who was seen in consultation at the request of Tracie Harrier, MD for evaluation of polycythemia Reviewed patient's recent lab work results at [facility] system via care everywhere. 07/25/19 labs showed elevated hemoglobin at 16.4, hematocrit 49.8, total white count 12.1, platelet count 453.  chronic Onset, duration since November 2017 when her hemoglobin level is 15.7. No aggravating or alleviating factors.  07/25/2019 CMP showed calcium level of 10.4.  Normal creatinine level at 1.6, normal LFTs.  Associated signs or symptoms: Denies weight loss, fever, chills, fatigue, night sweats.   Context:  Smoking history: Extensive smoking history, 86-pack-year smoking history.  2 pack a day for 43 years. History of blood clots: Denies Daytime somnolence: Denies Family history of polycythemia: Denies  Patient reports that she has been smoking more than her baseline due to increased stress level. Lives with son who has autism.   Review of Systems  Constitutional: Positive for fatigue. Negative for appetite change, chills and fever.  HENT:   Negative for hearing loss and voice change.   Eyes: Negative for eye problems.  Respiratory: Negative for chest tightness and cough.   Cardiovascular: Negative for chest pain.  Gastrointestinal: Negative for abdominal distention, abdominal pain and blood in stool.  Endocrine: Negative for hot flashes.  Genitourinary: Negative for difficulty urinating and frequency.   Musculoskeletal: Negative for arthralgias.  Skin: Negative for itching and rash.  Neurological:  Negative for extremity weakness.  Hematological: Negative for adenopathy.  Psychiatric/Behavioral: Negative for confusion. The patient is nervous/anxious.     MEDICAL HISTORY:  Past Medical History:  Diagnosis Date  . COPD (chronic obstructive pulmonary disease) ([place]) 11/03/2016  . DDD (degenerative disc disease), lumbar   . Polycythemia   . Ruptured disc, cervical   . Scoliosis     SURGICAL HISTORY: Past Surgical History:  Procedure Laterality Date  . BREAST EXCISIONAL BIOPSY Right    years ago  . DILATION AND CURETTAGE OF UTERUS    . NECK SURGERY      SOCIAL HISTORY: Social History   Socioeconomic History  . Marital status: Married    Spouse name: Not on file  . Number of children: Not on file  . Years of education: Not on file  . Highest education level: Not on file  Occupational History  . Not on file  Social Needs  . Financial resource strain: Not on file  . Food insecurity    Worry: Not on file    Inability: Not on file  . Transportation needs    Medical: Not on file    Non-medical: Not on file  Tobacco Use  . Smoking status: Current Every Day Smoker    Packs/day: 2.00    Years: 43.00    Pack years: 86.00  . Smokeless tobacco: Never Used  Substance and Sexual Activity  . Alcohol use: Yes    Comment: seldom maybe 2-3 times a year  . Drug use: No  . Sexual activity: Not Currently  Lifestyle  . Physical activity    Days per week: Not on file    Minutes per session: Not on file  . Stress: Not on file  Relationships  . Social connections  Talks on phone: Not on file    Gets together: Not on file    Attends religious service: Not on file    Active member of club or organization: Not on file    Attends meetings of clubs or organizations: Not on file    Relationship status: Not on file  . Intimate partner violence    Fear of current or ex partner: Not on file    Emotionally abused: Not on file    Physically abused: Not on file    Forced sexual  activity: Not on file  Other Topics Concern  . Not on file  Social History Narrative  . Not on file    FAMILY HISTORY: Family History  Problem Relation Age of Onset  . Breast cancer Maternal Aunt 74  . Bone cancer Paternal Aunt   . Colon cancer Maternal Aunt     ALLERGIES:  is allergic to prochlorperazine maleate and ibuprofen.  MEDICATIONS:  Current Outpatient Medications  Medication Sig Dispense Refill  . acetaminophen (TYLENOL) 500 MG tablet Take 500 mg by mouth daily.     . Albuterol Sulfate (PROAIR RESPICLICK) 195 (90 Base) MCG/ACT AEPB Inhale 2 puffs into the lungs every 6 (six) hours as needed. 2 each 3  . cyclobenzaprine (FLEXERIL) 5 MG tablet Take 5 mg by mouth 3 (three) times daily as needed for muscle spasms.    . fluticasone (FLONASE) 50 MCG/ACT nasal spray Place 1 spray into both nostrils daily as needed.     . Fluticasone-Umeclidin-Vilant (TRELEGY ELLIPTA) 100-62.5-25 MCG/INH AEPB Inhale 1 puff into the lungs daily. 1 each 0  . Inulin (FIBER CHOICE PO) Take 1 Dose by mouth daily as needed.     . Melatonin 5 MG TABS Take 1 tablet by mouth daily.     . naproxen sodium (ANAPROX) 220 MG tablet Take 220 mg by mouth 3 times/day as needed-between meals & bedtime.     Marland Kitchen omeprazole (PRILOSEC) 20 MG capsule Take 20 mg by mouth daily.     Marland Kitchen Respiratory Therapy Supplies (FLUTTER) DEVI Use as directed 1 each 0   No current facility-administered medications for this visit.      PHYSICAL EXAMINATION: ECOG PERFORMANCE STATUS: 1 - Symptomatic but completely ambulatory Vitals:   08/17/19 1012  BP: (!) 148/75  Pulse: (!) 58  Resp: 18  Temp: (!) 95.1 F (35.1 C)   Filed Weights   08/17/19 1012  Weight: 140 lb (63.5 kg)    Physical Exam Constitutional:      General: She is not in acute distress.    Comments: thin  HENT:     Head: Normocephalic and atraumatic.  Eyes:     General: No scleral icterus.    Pupils: Pupils are equal, round, and reactive to light.  Neck:      Musculoskeletal: Normal range of motion and neck supple.  Cardiovascular:     Rate and Rhythm: Normal rate and regular rhythm.     Heart sounds: Normal heart sounds.  Pulmonary:     Effort: Pulmonary effort is normal. No respiratory distress.     Breath sounds: No wheezing.  Abdominal:     General: Bowel sounds are normal. There is no distension.     Palpations: Abdomen is soft. There is no mass.     Tenderness: There is no abdominal tenderness.  Musculoskeletal: Normal range of motion.        General: No deformity.  Skin:    General: Skin is warm and  dry.     Findings: No erythema or rash.  Neurological:     Mental Status: She is alert and oriented to person, place, and time.     Cranial Nerves: No cranial nerve deficit.     Coordination: Coordination normal.  Psychiatric:        Behavior: Behavior normal.        Thought Content: Thought content normal.     RADIOGRAPHIC STUDIES: I have personally reviewed the radiological images as listed and agreed with the findings in the report. No results found.  11/19/2018 CT chest with contrast showed right middle lobe collapse without enhancing or obstructing mass identified.  Low-density material/mucous within the right middle lobe bronchi noted as well as within scattered bilateral lower lobe bronchi.  12/31/2018 chest x-ray showed resolved right middle lobe atelectasis.  Normal chest radiograph.  LABORATORY DATA:  I have reviewed the data as listed Lab Results  Component Value Date   WBC 9.4 08/17/2019   HGB 14.6 08/17/2019   HCT 44.3 08/17/2019   MCV 95.1 08/17/2019   PLT 420 (H) 08/17/2019   Recent Labs    11/19/18 1507 08/17/19 1114  NA  --  140  K  --  4.2  CL  --  104  CO2  --  28  GLUCOSE  --  104*  BUN  --  8  CREATININE 0.60 0.56  CALCIUM  --  9.7  GFRNONAA  --  >60  GFRAA  --  >60   Iron/TIBC/Ferritin/ %Sat No results found for: IRON, TIBC, FERRITIN, IRONPCTSAT      ASSESSMENT & PLAN:  1.  Erythrocytosis   2. Personal history of tobacco use, presenting hazards to health   3. Hypercalcemia     Labs are reviewed and discussed with patient. Polycythemia (erythrocytosis) is an abnormal elevation of hemoglobin (Hgb) and/or hematocrit (Hct) in peripheral blood, and this can be caused by primary etiology, ie bone marrow mutation, or secondary etiology, ie hypoxia, smoking, androgen supplements, etc.  Clinically she has risk factors for secondary erythrocytosis-smoking, underlying lung disease. I will obtain erythropoietin, carbo monoxide level, rule out primary etiology, JAK2 with reflex to other mutations Smoking cessation was discussed with patient and she is not interested  -Labs are reviewed..  Hemoglobin 14.6, MCV 95 no need for urgent phlebotomy Hypercalcemia, calcium was 10.4.  Repeat BMP today.  Follow-up 3 months for evaluation and further discussion.  Orders Placed This Encounter  Procedures  . CBC with Differential/Platelet    Standing Status:   Future    Number of Occurrences:   1    Standing Expiration Date:   08/16/2020  . Erythropoietin    Standing Status:   Future    Number of Occurrences:   1    Standing Expiration Date:   08/16/2020  . Carbon monoxide, blood (performed at ref lab)    Standing Status:   Future    Number of Occurrences:   1    Standing Expiration Date:   08/16/2020  . JAK2 V617F, w Reflex to CALR/E12/MPL    Standing Status:   Future    Number of Occurrences:   1    Standing Expiration Date:   08/16/2020  . Basic metabolic panel    Standing Status:   Future    Number of Occurrences:   1    Standing Expiration Date:   08/16/2020  . CBC with Differential/Platelet    Standing Status:   Future    Standing Expiration  Date:   08/16/2020    We spent sufficient time to discuss many aspect of care, questions were answered to patient's satisfaction. The patient knows to call the clinic with any problems questions or concerns.  Cc Hande, Vishwanath,  MD  Return of visit:  3 months.  Thank you for this kind referral and the opportunity to participate in the care of this patient. A copy of today's note is routed to referring provider  Total face to face encounter time for this patient visit was 45 min. >50% of the time was  spent in counseling and coordination of care.    Earlie Server, MD, PhD 08/17/2019

## 2019-08-18 LAB — CARBON MONOXIDE, BLOOD (PERFORMED AT REF LAB): Carbon Monoxide, Blood: 10.4 % — ABNORMAL HIGH (ref 0.0–3.6)

## 2019-08-18 LAB — ERYTHROPOIETIN: Erythropoietin: 7.5 m[IU]/mL (ref 2.6–18.5)

## 2019-08-26 LAB — JAK2 V617F, W REFLEX TO CALR/E12/MPL

## 2019-08-26 LAB — CALR + JAK2 E12-15 + MPL (REFLEXED)

## 2019-11-16 ENCOUNTER — Telehealth: Payer: Self-pay | Admitting: *Deleted

## 2019-11-16 DIAGNOSIS — Z87891 Personal history of nicotine dependence: Secondary | ICD-10-CM

## 2019-11-16 DIAGNOSIS — Z122 Encounter for screening for malignant neoplasm of respiratory organs: Secondary | ICD-10-CM

## 2019-11-16 NOTE — Telephone Encounter (Signed)
Left a voicemail for patient to notify them that it is time to schedule annual low dose lung cancer screening CT scan. Instructed patient to call back to verify information prior to the scan being scheduled.

## 2019-11-17 ENCOUNTER — Encounter: Payer: Self-pay | Admitting: Oncology

## 2019-11-17 ENCOUNTER — Inpatient Hospital Stay: Payer: BC Managed Care – PPO | Attending: Oncology

## 2019-11-17 ENCOUNTER — Other Ambulatory Visit: Payer: Self-pay

## 2019-11-17 ENCOUNTER — Inpatient Hospital Stay (HOSPITAL_BASED_OUTPATIENT_CLINIC_OR_DEPARTMENT_OTHER): Payer: BC Managed Care – PPO | Admitting: Oncology

## 2019-11-17 VITALS — BP 153/82 | HR 80 | Temp 97.7°F | Resp 16 | Wt 136.3 lb

## 2019-11-17 DIAGNOSIS — D751 Secondary polycythemia: Secondary | ICD-10-CM | POA: Diagnosis not present

## 2019-11-17 DIAGNOSIS — Z79899 Other long term (current) drug therapy: Secondary | ICD-10-CM | POA: Insufficient documentation

## 2019-11-17 DIAGNOSIS — F17210 Nicotine dependence, cigarettes, uncomplicated: Secondary | ICD-10-CM | POA: Diagnosis not present

## 2019-11-17 DIAGNOSIS — Z800 Family history of malignant neoplasm of digestive organs: Secondary | ICD-10-CM | POA: Insufficient documentation

## 2019-11-17 DIAGNOSIS — M5136 Other intervertebral disc degeneration, lumbar region: Secondary | ICD-10-CM | POA: Insufficient documentation

## 2019-11-17 DIAGNOSIS — Z87891 Personal history of nicotine dependence: Secondary | ICD-10-CM

## 2019-11-17 DIAGNOSIS — Z803 Family history of malignant neoplasm of breast: Secondary | ICD-10-CM | POA: Diagnosis not present

## 2019-11-17 DIAGNOSIS — J449 Chronic obstructive pulmonary disease, unspecified: Secondary | ICD-10-CM | POA: Diagnosis not present

## 2019-11-17 LAB — CBC WITH DIFFERENTIAL/PLATELET
Abs Immature Granulocytes: 0.02 10*3/uL (ref 0.00–0.07)
Basophils Absolute: 0.1 10*3/uL (ref 0.0–0.1)
Basophils Relative: 1 %
Eosinophils Absolute: 0.2 10*3/uL (ref 0.0–0.5)
Eosinophils Relative: 2 %
HCT: 44.6 % (ref 36.0–46.0)
Hemoglobin: 14.8 g/dL (ref 12.0–15.0)
Immature Granulocytes: 0 %
Lymphocytes Relative: 21 %
Lymphs Abs: 1.8 10*3/uL (ref 0.7–4.0)
MCH: 31.4 pg (ref 26.0–34.0)
MCHC: 33.2 g/dL (ref 30.0–36.0)
MCV: 94.7 fL (ref 80.0–100.0)
Monocytes Absolute: 0.5 10*3/uL (ref 0.1–1.0)
Monocytes Relative: 6 %
Neutro Abs: 6.1 10*3/uL (ref 1.7–7.7)
Neutrophils Relative %: 70 %
Platelets: 433 10*3/uL — ABNORMAL HIGH (ref 150–400)
RBC: 4.71 MIL/uL (ref 3.87–5.11)
RDW: 13.5 % (ref 11.5–15.5)
WBC: 8.7 10*3/uL (ref 4.0–10.5)
nRBC: 0 % (ref 0.0–0.2)

## 2019-11-17 NOTE — Progress Notes (Signed)
Hematology/Oncology Consult note [facility] Telephone:([phone] [phone]   Patient Care Team: Tracie Harrier, MD as PCP - General (Internal Medicine)  REFERRING PROVIDER: Tracie Harrier, MD  CHIEF COMPLAINTS/REASON FOR VISIT:  Evaluation of polycythemia  HISTORY OF PRESENTING ILLNESS:  Yvonne Pitts is a 63 y.o. female who was seen in consultation at the request of Tracie Harrier, MD for evaluation of polycythemia Reviewed patient's recent lab work results at [facility] system via care everywhere. 07/25/19 labs showed elevated hemoglobin at 16.4, hematocrit 49.8, total white count 12.1, platelet count 453.  chronic Onset, duration since November 2017 when her hemoglobin level is 15.7. No aggravating or alleviating factors.  07/25/2019 CMP showed calcium level of 10.4.  Normal creatinine level at 1.6, normal LFTs.  Associated signs or symptoms: Denies weight loss, fever, chills, fatigue, night sweats.   Context:  Smoking history: Extensive smoking history, 86-pack-year smoking history.  2 pack a day for 43 years. History of blood clots: Denies Daytime somnolence: Denies Family history of polycythemia: Denies  Patient reports that she has been smoking more than her baseline due to increased stress level. Lives with son who has autism.  INTERVAL HISTORY Yvonne Pitts is a 62 y.o. female who has above history reviewed by me today presents for follow up visit for management of erythrocytosis.  Problems and complaints are listed below: She has no new complaints. She is due for CT chest lung cancer screening.  Continue to smoke daily, about 2 packs a day.  Chronic fatigue, no change  Review of Systems  Constitutional: Positive for fatigue. Negative for appetite change, chills and fever.  HENT:   Negative for hearing loss and voice change.   Eyes: Negative for eye problems.  Respiratory: Negative for chest  tightness and cough.   Cardiovascular: Negative for chest pain.  Gastrointestinal: Negative for abdominal distention, abdominal pain and blood in stool.  Endocrine: Negative for hot flashes.  Genitourinary: Negative for difficulty urinating and frequency.   Musculoskeletal: Negative for arthralgias.  Skin: Negative for itching and rash.  Neurological: Negative for extremity weakness.  Hematological: Negative for adenopathy.  Psychiatric/Behavioral: Negative for confusion. The patient is not nervous/anxious.     MEDICAL HISTORY:  Past Medical History:  Diagnosis Date  . COPD (chronic obstructive pulmonary disease) ([place]) 11/03/2016  . DDD (degenerative disc disease), lumbar   . Polycythemia   . Ruptured disc, cervical   . Scoliosis     SURGICAL HISTORY: Past Surgical History:  Procedure Laterality Date  . BREAST EXCISIONAL BIOPSY Right    years ago  . DILATION AND CURETTAGE OF UTERUS    . NECK SURGERY      SOCIAL HISTORY: Social History   Socioeconomic History  . Marital status: Married    Spouse name: Not on file  . Number of children: Not on file  . Years of education: Not on file  . Highest education level: Not on file  Occupational History  . Not on file  Social Needs  . Financial resource strain: Not on file  . Food insecurity    Worry: Not on file    Inability: Not on file  . Transportation needs    Medical: Not on file    Non-medical: Not on file  Tobacco Use  . Smoking status: Current Every Day Smoker    Packs/day: 2.00    Years: 43.00    Pack years: 86.00  . Smokeless tobacco: Never Used  Substance and Sexual Activity  .  Alcohol use: Yes    Comment: seldom maybe 2-3 times a year  . Drug use: No  . Sexual activity: Not Currently  Lifestyle  . Physical activity    Days per week: Not on file    Minutes per session: Not on file  . Stress: Not on file  Relationships  . Social Herbalist on phone: Not on file    Gets together: Not on file     Attends religious service: Not on file    Active member of club or organization: Not on file    Attends meetings of clubs or organizations: Not on file    Relationship status: Not on file  . Intimate partner violence    Fear of current or ex partner: Not on file    Emotionally abused: Not on file    Physically abused: Not on file    Forced sexual activity: Not on file  Other Topics Concern  . Not on file  Social History Narrative  . Not on file    FAMILY HISTORY: Family History  Problem Relation Age of Onset  . Breast cancer Maternal Aunt 62  . Bone cancer Paternal Aunt   . Colon cancer Maternal Aunt     ALLERGIES:  is allergic to prochlorperazine maleate and ibuprofen.  MEDICATIONS:  Current Outpatient Medications  Medication Sig Dispense Refill  . acetaminophen (TYLENOL) 500 MG tablet Take 500 mg by mouth daily.     . Albuterol Sulfate (PROAIR RESPICLICK) 123XX123 (90 Base) MCG/ACT AEPB Inhale 2 puffs into the lungs every 6 (six) hours as needed. 2 each 3  . cyclobenzaprine (FLEXERIL) 5 MG tablet Take 5 mg by mouth 3 (three) times daily as needed for muscle spasms.    . fluticasone (FLONASE) 50 MCG/ACT nasal spray Place 1 spray into both nostrils daily as needed.     . Fluticasone-Umeclidin-Vilant (TRELEGY ELLIPTA) 100-62.5-25 MCG/INH AEPB Inhale 1 puff into the lungs daily. 1 each 0  . Inulin (FIBER CHOICE PO) Take 1 Dose by mouth daily as needed.     . Melatonin 5 MG TABS Take 1 tablet by mouth daily.     . naproxen sodium (ANAPROX) 220 MG tablet Take 220 mg by mouth 3 times/day as needed-between meals & bedtime.     Marland Kitchen omeprazole (PRILOSEC) 20 MG capsule Take 20 mg by mouth daily.     Marland Kitchen Respiratory Therapy Supplies (FLUTTER) DEVI Use as directed 1 each 0   No current facility-administered medications for this visit.      PHYSICAL EXAMINATION: ECOG PERFORMANCE STATUS: 1 - Symptomatic but completely ambulatory Vitals:   11/17/19 0948  BP: (!) 153/82  Pulse: 80  Resp:  16  Temp: 97.7 F (36.5 C)   Filed Weights   11/17/19 0948  Weight: 136 lb 4.8 oz (61.8 kg)    Physical Exam Constitutional:      General: She is not in acute distress.    Comments: Thin built.   HENT:     Head: Normocephalic and atraumatic.  Eyes:     General: No scleral icterus.    Pupils: Pupils are equal, round, and reactive to light.  Neck:     Musculoskeletal: Normal range of motion and neck supple.  Cardiovascular:     Rate and Rhythm: Normal rate and regular rhythm.     Heart sounds: Normal heart sounds.  Pulmonary:     Effort: Pulmonary effort is normal. No respiratory distress.     Breath sounds:  No wheezing.  Abdominal:     General: Bowel sounds are normal. There is no distension.     Palpations: Abdomen is soft. There is no mass.     Tenderness: There is no abdominal tenderness.  Musculoskeletal: Normal range of motion.        General: No deformity.  Skin:    General: Skin is warm and dry.     Findings: No erythema or rash.  Neurological:     Mental Status: She is alert and oriented to person, place, and time.     Cranial Nerves: No cranial nerve deficit.     Coordination: Coordination normal.  Psychiatric:        Behavior: Behavior normal.        Thought Content: Thought content normal.     RADIOGRAPHIC STUDIES: I have personally reviewed the radiological images as listed and agreed with the findings in the report. No results found.  11/19/2018 CT chest with contrast showed right middle lobe collapse without enhancing or obstructing mass identified.  Low-density material/mucous within the right middle lobe bronchi noted as well as within scattered bilateral lower lobe bronchi. 12/31/2018 chest x-ray showed resolved right middle lobe atelectasis.  Normal chest radiograph.  LABORATORY DATA:  I have reviewed the data as listed Lab Results  Component Value Date   WBC 8.7 11/17/2019   HGB 14.8 11/17/2019   HCT 44.6 11/17/2019   MCV 94.7 11/17/2019    PLT 433 (H) 11/17/2019   Recent Labs    11/19/18 1507 08/17/19 1114  NA  --  140  K  --  4.2  CL  --  104  CO2  --  28  GLUCOSE  --  104*  BUN  --  8  CREATININE 0.60 0.56  CALCIUM  --  9.7  GFRNONAA  --  >60  GFRAA  --  >60   Iron/TIBC/Ferritin/ %Sat No results found for: IRON, TIBC, FERRITIN, IRONPCTSAT      ASSESSMENT & PLAN:  1. Erythrocytosis   2. Personal history of tobacco use, presenting hazards to health    Labs are reviewed and discussed with patient. Elevated carbon Monoxide level,  JAK2 V617F, w Reflex to CALR/E12/MPL negative.  Most likely erythrocytosis is secondary, likely due to smoking.   Smoking. Cessation was discussed in details.   Recommend continue lung cancer screening.  Follow-up 6 months for evaluation and further discussion.  Orders Placed This Encounter  Procedures  . CBC with Differential    Standing Status:   Future    Standing Expiration Date:   11/16/2020    We spent sufficient time to discuss many aspect of care, questions were answered to patient's satisfaction. The patient knows to call the clinic with any problems questions or concerns.  Cc Hande, Vishwanath, MD  Return of visit:  6 months.  We spent sufficient time to discuss many aspect of care, questions were answered to patient's satisfaction. Total face to face encounter time for this patient visit was 15 min. >50% of the time was  spent in counseling and coordination of care.     Earlie Server, MD, PhD 11/17/2019

## 2019-11-17 NOTE — Telephone Encounter (Signed)
Patient has been notified that annual lung cancer screening low dose CT scan is due currently or will be in near future. Confirmed that patient is within the age range of 55-77, and asymptomatic, (no signs or symptoms of lung cancer). Patient denies illness that would prevent curative treatment for lung cancer if found. Verified smoking history, (current, 77.25 pack year). The shared decision making visit was done 11/18/18. Patient is agreeable for CT scan being scheduled.

## 2019-11-17 NOTE — Addendum Note (Signed)
Addended by: Lieutenant Diego on: 11/17/2019 11:49 AM   Modules accepted: Orders

## 2019-11-21 ENCOUNTER — Ambulatory Visit: Admission: RE | Admit: 2019-11-21 | Payer: BC Managed Care – PPO | Source: Ambulatory Visit

## 2019-11-28 ENCOUNTER — Ambulatory Visit
Admission: RE | Admit: 2019-11-28 | Discharge: 2019-11-28 | Disposition: A | Discharge status: Home / Self Care | Payer: BC Managed Care – PPO | Source: Ambulatory Visit | Attending: Nurse Practitioner | Admitting: Nurse Practitioner

## 2019-11-28 ENCOUNTER — Other Ambulatory Visit: Payer: Self-pay

## 2019-11-28 DIAGNOSIS — Z87891 Personal history of nicotine dependence: Secondary | ICD-10-CM | POA: Insufficient documentation

## 2019-11-28 DIAGNOSIS — Z122 Encounter for screening for malignant neoplasm of respiratory organs: Secondary | ICD-10-CM | POA: Diagnosis not present

## 2019-11-30 ENCOUNTER — Encounter: Payer: Self-pay | Admitting: *Deleted

## 2019-12-01 ENCOUNTER — Other Ambulatory Visit: Payer: Self-pay | Admitting: Internal Medicine

## 2019-12-01 DIAGNOSIS — J449 Chronic obstructive pulmonary disease, unspecified: Secondary | ICD-10-CM

## 2019-12-01 DIAGNOSIS — J432 Centrilobular emphysema: Secondary | ICD-10-CM

## 2019-12-01 DIAGNOSIS — J4521 Mild intermittent asthma with (acute) exacerbation: Secondary | ICD-10-CM

## 2019-12-01 DIAGNOSIS — R911 Solitary pulmonary nodule: Secondary | ICD-10-CM

## 2020-01-23 ENCOUNTER — Other Ambulatory Visit: Payer: Self-pay | Admitting: Pulmonary Disease

## 2020-04-03 DIAGNOSIS — M47812 Spondylosis without myelopathy or radiculopathy, cervical region: Secondary | ICD-10-CM | POA: Insufficient documentation

## 2020-05-03 ENCOUNTER — Other Ambulatory Visit: Payer: Self-pay | Admitting: Internal Medicine

## 2020-05-03 DIAGNOSIS — Z1231 Encounter for screening mammogram for malignant neoplasm of breast: Secondary | ICD-10-CM

## 2020-05-14 ENCOUNTER — Ambulatory Visit
Admission: RE | Admit: 2020-05-14 | Discharge: 2020-05-14 | Disposition: A | Discharge status: Home / Self Care | Payer: BC Managed Care – PPO | Source: Ambulatory Visit | Attending: Internal Medicine | Admitting: Internal Medicine

## 2020-05-14 DIAGNOSIS — Z1231 Encounter for screening mammogram for malignant neoplasm of breast: Secondary | ICD-10-CM | POA: Insufficient documentation

## 2020-05-16 ENCOUNTER — Other Ambulatory Visit: Payer: Self-pay

## 2020-05-16 ENCOUNTER — Encounter: Payer: Self-pay | Admitting: Oncology

## 2020-05-16 ENCOUNTER — Inpatient Hospital Stay: Payer: BC Managed Care – PPO | Attending: Oncology

## 2020-05-16 ENCOUNTER — Inpatient Hospital Stay (HOSPITAL_BASED_OUTPATIENT_CLINIC_OR_DEPARTMENT_OTHER): Payer: BC Managed Care – PPO | Admitting: Oncology

## 2020-05-16 VITALS — BP 127/73 | HR 74 | Temp 96.8°F | Resp 18 | Wt 139.8 lb

## 2020-05-16 DIAGNOSIS — D75839 Thrombocytosis, unspecified: Secondary | ICD-10-CM

## 2020-05-16 DIAGNOSIS — J449 Chronic obstructive pulmonary disease, unspecified: Secondary | ICD-10-CM | POA: Insufficient documentation

## 2020-05-16 DIAGNOSIS — F17210 Nicotine dependence, cigarettes, uncomplicated: Secondary | ICD-10-CM | POA: Diagnosis not present

## 2020-05-16 DIAGNOSIS — M419 Scoliosis, unspecified: Secondary | ICD-10-CM | POA: Diagnosis not present

## 2020-05-16 DIAGNOSIS — Z800 Family history of malignant neoplasm of digestive organs: Secondary | ICD-10-CM | POA: Diagnosis not present

## 2020-05-16 DIAGNOSIS — D751 Secondary polycythemia: Secondary | ICD-10-CM | POA: Diagnosis not present

## 2020-05-16 DIAGNOSIS — Z87891 Personal history of nicotine dependence: Secondary | ICD-10-CM

## 2020-05-16 DIAGNOSIS — D473 Essential (hemorrhagic) thrombocythemia: Secondary | ICD-10-CM | POA: Diagnosis not present

## 2020-05-16 DIAGNOSIS — Z803 Family history of malignant neoplasm of breast: Secondary | ICD-10-CM | POA: Insufficient documentation

## 2020-05-16 DIAGNOSIS — M5136 Other intervertebral disc degeneration, lumbar region: Secondary | ICD-10-CM | POA: Insufficient documentation

## 2020-05-16 DIAGNOSIS — Z79899 Other long term (current) drug therapy: Secondary | ICD-10-CM | POA: Insufficient documentation

## 2020-05-16 DIAGNOSIS — M5020 Other cervical disc displacement, unspecified cervical region: Secondary | ICD-10-CM | POA: Diagnosis not present

## 2020-05-16 LAB — CBC WITH DIFFERENTIAL/PLATELET
Abs Immature Granulocytes: 0.04 10*3/uL (ref 0.00–0.07)
Basophils Absolute: 0.1 10*3/uL (ref 0.0–0.1)
Basophils Relative: 1 %
Eosinophils Absolute: 0.2 10*3/uL (ref 0.0–0.5)
Eosinophils Relative: 2 %
HCT: 45.4 % (ref 36.0–46.0)
Hemoglobin: 15 g/dL (ref 12.0–15.0)
Immature Granulocytes: 0 %
Lymphocytes Relative: 24 %
Lymphs Abs: 2.2 10*3/uL (ref 0.7–4.0)
MCH: 31.3 pg (ref 26.0–34.0)
MCHC: 33 g/dL (ref 30.0–36.0)
MCV: 94.8 fL (ref 80.0–100.0)
Monocytes Absolute: 0.6 10*3/uL (ref 0.1–1.0)
Monocytes Relative: 7 %
Neutro Abs: 6.2 10*3/uL (ref 1.7–7.7)
Neutrophils Relative %: 66 %
Platelets: 442 10*3/uL — ABNORMAL HIGH (ref 150–400)
RBC: 4.79 MIL/uL (ref 3.87–5.11)
RDW: 13.3 % (ref 11.5–15.5)
WBC: 9.3 10*3/uL (ref 4.0–10.5)
nRBC: 0 % (ref 0.0–0.2)

## 2020-05-16 NOTE — Progress Notes (Signed)
Patient denies problems/concerns today.   

## 2020-05-16 NOTE — Progress Notes (Signed)
Hematology/Oncology Consult note [facility] Telephone:([phone] [phone]   Patient Care Team: Tracie Harrier, MD as PCP - General (Internal Medicine)  REFERRING PROVIDER: Tracie Harrier, MD  CHIEF COMPLAINTS/REASON FOR VISIT:  Follow up for thrombocytosis.   HISTORY OF PRESENTING ILLNESS:  Yvonne Pitts is a 64 y.o. female who was seen in consultation at the request of Tracie Harrier, MD for follow up thrombocytosis.  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 Elevated carbon Monoxide level,  JAK2 V617F, w Reflex to CALR/E12/MPL negative.    INTERVAL HISTORY Yvonne Pitts is a 64 y.o. female who has above history reviewed by me today presents for follow up visit for management of thrombocytosis.   Problems and complaints are listed below: No new complaints.  She continues to smoke 2 packs daily.  11/28/2019 CT chest Lung cancer screening showed benign appearance. Review of Systems  Constitutional: Negative for appetite change, chills, fatigue and fever.  HENT:   Negative for hearing loss and voice change.   Eyes: Negative for eye problems.  Respiratory: Negative for chest tightness and cough.   Cardiovascular: Negative for chest pain.  Gastrointestinal: Negative for abdominal distention, abdominal pain and blood in stool.  Endocrine: Negative for hot flashes.  Genitourinary: Negative for difficulty urinating and frequency.   Musculoskeletal: Negative for arthralgias.  Skin: Negative for itching and rash.  Neurological: Negative for extremity weakness.  Hematological: Negative for adenopathy.  Psychiatric/Behavioral: Negative for confusion. The patient is not nervous/anxious.     MEDICAL HISTORY:  Past Medical History:  Diagnosis Date  . COPD (chronic obstructive pulmonary disease) ([place]) 11/03/2016  . DDD (degenerative disc disease), lumbar   . Polycythemia   . Ruptured disc, cervical   . Scoliosis     SURGICAL  HISTORY: Past Surgical History:  Procedure Laterality Date  . BREAST EXCISIONAL BIOPSY Right    years ago  . DILATION AND CURETTAGE OF UTERUS    . NECK SURGERY      SOCIAL HISTORY: Social History   Socioeconomic History  . Marital status: Married    Spouse name: Not on file  . Number of children: Not on file  . Years of education: Not on file  . Highest education level: Not on file  Occupational History  . Not on file  Tobacco Use  . Smoking status: Current Every Day Smoker    Packs/day: 2.00    Years: 43.00    Pack years: 86.00  . Smokeless tobacco: Never Used  Substance and Sexual Activity  . Alcohol use: Yes    Comment: seldom maybe 2-3 times a year  . Drug use: No  . Sexual activity: Not Currently  Other Topics Concern  . Not on file  Social History Narrative  . Not on file   Social Determinants of Health   Financial Resource Strain:   . Difficulty of Paying Living Expenses:   Food Insecurity:   . Worried About Charity fundraiser in the Last Year:   . Arboriculturist in the Last Year:   Transportation Needs:   . Film/video editor (Medical):   Marland Kitchen Lack of Transportation (Non-Medical):   Physical Activity:   . Days of Exercise per Week:   . Minutes of Exercise per Session:   Stress:   . Feeling of Stress :   Social Connections:   . Frequency of Communication with Friends and Family:   . Frequency of Social Gatherings with Friends and Family:   .  Attends Religious Services:   . Active Member of Clubs or Organizations:   . Attends Archivist Meetings:   Marland Kitchen Marital Status:   Intimate Partner Violence:   . Fear of Current or Ex-Partner:   . Emotionally Abused:   Marland Kitchen Physically Abused:   . Sexually Abused:     FAMILY HISTORY: Family History  Problem Relation Age of Onset  . Breast cancer Maternal Aunt 44  . Bone cancer Paternal Aunt   . Colon cancer Maternal Aunt     ALLERGIES:  is allergic to prochlorperazine maleate and  ibuprofen.  MEDICATIONS:  Current Outpatient Medications  Medication Sig Dispense Refill  . acetaminophen (TYLENOL) 500 MG tablet Take 500 mg by mouth daily.     . Albuterol Sulfate (PROAIR RESPICLICK) 123XX123 (90 Base) MCG/ACT AEPB Inhale 2 puffs into the lungs every 6 (six) hours as needed. 2 each 3  . amLODipine (NORVASC) 2.5 MG tablet TAKE 1 TABLET BY MOUTH EVERY DAY    . cyclobenzaprine (FLEXERIL) 5 MG tablet Take 5 mg by mouth 3 (three) times daily as needed for muscle spasms.    . fluticasone (FLONASE) 50 MCG/ACT nasal spray Place 1 spray into both nostrils daily as needed.     . Fluticasone-Umeclidin-Vilant (TRELEGY ELLIPTA) 100-62.5-25 MCG/INH AEPB Inhale 1 puff into the lungs daily. 1 each 0  . Melatonin 5 MG TABS Take 1 tablet by mouth daily.     . naproxen sodium (ANAPROX) 220 MG tablet Take 220 mg by mouth 3 times/day as needed-between meals & bedtime.     Marland Kitchen omeprazole (PRILOSEC) 20 MG capsule Take 20 mg by mouth daily.     Marland Kitchen Respiratory Therapy Supplies (FLUTTER) DEVI Use as directed 1 each 0  . Inulin (FIBER CHOICE PO) Take 1 Dose by mouth daily as needed.      No current facility-administered medications for this visit.     PHYSICAL EXAMINATION: ECOG PERFORMANCE STATUS: 1 - Symptomatic but completely ambulatory Vitals:   05/16/20 1026  BP: 127/73  Pulse: 74  Resp: 18  Temp: (!) 96.8 F (36 C)   Filed Weights   05/16/20 1026  Weight: 139 lb 12.8 oz (63.4 kg)    Physical Exam Constitutional:      General: She is not in acute distress.    Comments: Thin built.   HENT:     Head: Normocephalic and atraumatic.  Eyes:     General: No scleral icterus.    Pupils: Pupils are equal, round, and reactive to light.  Cardiovascular:     Rate and Rhythm: Normal rate and regular rhythm.     Heart sounds: Normal heart sounds.  Pulmonary:     Effort: Pulmonary effort is normal. No respiratory distress.     Breath sounds: No wheezing.  Abdominal:     General: Bowel sounds  are normal. There is no distension.     Palpations: Abdomen is soft. There is no mass.     Tenderness: There is no abdominal tenderness.  Musculoskeletal:        General: No deformity. Normal range of motion.     Cervical back: Normal range of motion and neck supple.  Skin:    General: Skin is warm and dry.     Findings: No erythema or rash.  Neurological:     Mental Status: She is alert and oriented to person, place, and time. Mental status is at baseline.     Cranial Nerves: No cranial nerve deficit.  Coordination: Coordination normal.  Psychiatric:        Mood and Affect: Mood normal.     RADIOGRAPHIC STUDIES: I have personally reviewed the radiological images as listed and agreed with the findings in the report. MM 3D SCREEN BREAST BILATERAL  Result Date: 05/14/2020 CLINICAL DATA:  Screening. EXAM: DIGITAL SCREENING BILATERAL MAMMOGRAM WITH TOMO AND CAD COMPARISON:  Previous exam(s). ACR Breast Density Category c: The breast tissue is heterogeneously dense, which may obscure small masses. FINDINGS: There are no findings suspicious for malignancy. Images were processed with CAD. IMPRESSION: No mammographic evidence of malignancy. A result letter of this screening mammogram will be mailed directly to the patient. RECOMMENDATION: Screening mammogram in one year. (Code:SM-B-01Y) BI-RADS CATEGORY  1: Negative. Electronically Signed   By: Audie Pinto M.D.   On: 05/14/2020 10:56   LABORATORY DATA:  I have reviewed the data as listed Lab Results  Component Value Date   WBC 9.3 05/16/2020   HGB 15.0 05/16/2020   HCT 45.4 05/16/2020   MCV 94.8 05/16/2020   PLT 442 (H) 05/16/2020   Recent Labs    08/17/19 1114  NA 140  K 4.2  CL 104  CO2 28  GLUCOSE 104*  BUN 8  CREATININE 0.56  CALCIUM 9.7  GFRNONAA >60  GFRAA >60   Iron/TIBC/Ferritin/ %Sat No results found for: IRON, TIBC, FERRITIN, IRONPCTSAT      ASSESSMENT & PLAN:  1. Thrombocytosis ([place])   2. 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 Smoke cessation was discussed with patient. Continue lung cancer screening.  Due in November 2021 Previously JAK2 V617F mutation negative, with reflex to other mutations CALR, MPL, JAK 2 Ex 12-15 mutations negative. I will check BCR ABL FISH work-up. Her hemoglobin is also at the high normal end, due to smoking.  Continue to monitor. Follow-up 6 months for evaluation and further discussion.  Orders Placed This Encounter  Procedures  . CBC with Differential/Platelet    Standing Status:   Future    Standing Expiration Date:   05/16/2021  . Comprehensive metabolic panel    Standing Status:   Future    Standing Expiration Date:   05/16/2021  . BCR-ABL1 FISH    Standing Status:   Future    Standing Expiration Date:   05/16/2021    We spent sufficient time to discuss many aspect of care, questions were answered to patient's satisfaction. The patient knows to call the clinic with any problems questions or concerns.  Cc Hande, Vishwanath, MD  Return of visit:  6 months.  We spent sufficient time to discuss many aspect of care, questions were answered to patient's satisfaction.  Earlie Server, MD, PhD 05/16/2020

## 2020-11-15 ENCOUNTER — Inpatient Hospital Stay (HOSPITAL_BASED_OUTPATIENT_CLINIC_OR_DEPARTMENT_OTHER): Payer: BC Managed Care – PPO | Admitting: Oncology

## 2020-11-15 ENCOUNTER — Encounter: Payer: Self-pay | Admitting: Oncology

## 2020-11-15 ENCOUNTER — Inpatient Hospital Stay: Payer: BC Managed Care – PPO | Attending: Oncology

## 2020-11-15 ENCOUNTER — Other Ambulatory Visit: Payer: Self-pay

## 2020-11-15 VITALS — BP 133/75 | HR 58 | Temp 97.5°F | Resp 18 | Wt 132.6 lb

## 2020-11-15 DIAGNOSIS — Z79899 Other long term (current) drug therapy: Secondary | ICD-10-CM | POA: Diagnosis not present

## 2020-11-15 DIAGNOSIS — D751 Secondary polycythemia: Secondary | ICD-10-CM | POA: Diagnosis not present

## 2020-11-15 DIAGNOSIS — F17210 Nicotine dependence, cigarettes, uncomplicated: Secondary | ICD-10-CM | POA: Diagnosis not present

## 2020-11-15 DIAGNOSIS — D75839 Thrombocytosis, unspecified: Secondary | ICD-10-CM

## 2020-11-15 DIAGNOSIS — Z803 Family history of malignant neoplasm of breast: Secondary | ICD-10-CM | POA: Diagnosis not present

## 2020-11-15 DIAGNOSIS — Z87891 Personal history of nicotine dependence: Secondary | ICD-10-CM | POA: Diagnosis not present

## 2020-11-15 DIAGNOSIS — J449 Chronic obstructive pulmonary disease, unspecified: Secondary | ICD-10-CM | POA: Insufficient documentation

## 2020-11-15 DIAGNOSIS — M5136 Other intervertebral disc degeneration, lumbar region: Secondary | ICD-10-CM | POA: Insufficient documentation

## 2020-11-15 LAB — CBC WITH DIFFERENTIAL/PLATELET
Abs Immature Granulocytes: 0.04 10*3/uL (ref 0.00–0.07)
Basophils Absolute: 0.1 10*3/uL (ref 0.0–0.1)
Basophils Relative: 1 %
Eosinophils Absolute: 0.1 10*3/uL (ref 0.0–0.5)
Eosinophils Relative: 1 %
HCT: 45.5 % (ref 36.0–46.0)
Hemoglobin: 15.2 g/dL — ABNORMAL HIGH (ref 12.0–15.0)
Immature Granulocytes: 0 %
Lymphocytes Relative: 21 %
Lymphs Abs: 2.2 10*3/uL (ref 0.7–4.0)
MCH: 31.5 pg (ref 26.0–34.0)
MCHC: 33.4 g/dL (ref 30.0–36.0)
MCV: 94.4 fL (ref 80.0–100.0)
Monocytes Absolute: 0.7 10*3/uL (ref 0.1–1.0)
Monocytes Relative: 6 %
Neutro Abs: 7.3 10*3/uL (ref 1.7–7.7)
Neutrophils Relative %: 71 %
Platelets: 493 10*3/uL — ABNORMAL HIGH (ref 150–400)
RBC: 4.82 MIL/uL (ref 3.87–5.11)
RDW: 13.8 % (ref 11.5–15.5)
WBC: 10.4 10*3/uL (ref 4.0–10.5)
nRBC: 0 % (ref 0.0–0.2)

## 2020-11-15 LAB — COMPREHENSIVE METABOLIC PANEL
ALT: 10 U/L (ref 0–44)
AST: 17 U/L (ref 15–41)
Albumin: 4.1 g/dL (ref 3.5–5.0)
Alkaline Phosphatase: 52 U/L (ref 38–126)
Anion gap: 8 (ref 5–15)
BUN: 8 mg/dL (ref 8–23)
CO2: 27 mmol/L (ref 22–32)
Calcium: 9.3 mg/dL (ref 8.9–10.3)
Chloride: 99 mmol/L (ref 98–111)
Creatinine, Ser: 0.51 mg/dL (ref 0.44–1.00)
GFR, Estimated: >60 mL/min (ref >60)
Glucose, Bld: 96 mg/dL (ref 70–99)
Potassium: 3.9 mmol/L (ref 3.5–5.1)
Sodium: 134 mmol/L — ABNORMAL LOW (ref 135–145)
Total Bilirubin: 0.5 mg/dL (ref 0.3–1.2)
Total Protein: 7.3 g/dL (ref 6.5–8.1)

## 2020-11-15 NOTE — Progress Notes (Signed)
Hematology/Oncology progress note [facility] Telephone:([phone] [phone]   Patient Care Team: Tracie Harrier, MD as PCP - General (Internal Medicine)  REFERRING PROVIDER: Tracie Harrier, MD  CHIEF COMPLAINTS/REASON FOR VISIT:  Follow up for thrombocytosis.   HISTORY OF PRESENTING ILLNESS:  Yvonne Pitts is a 64 y.o. female who was seen in consultation at the request of Tracie Harrier, MD for follow up thrombocytosis.  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 Elevated carbon Monoxide level,  JAK2 V617F, w Reflex to CALR/E12/MPL negative.    INTERVAL HISTORY Yvonne Pitts is a 65 y.o. female who has above history reviewed by me today presents for follow up visit for management of thrombocytosis.   Problems and complaints are listed below: Patient continues to smoke 2 packs daily.  No new complaints. Denies any shortness of breath, cough  11/28/2019 CT chest Lung cancer screening showed benign appearance. Review of Systems  Constitutional: Negative for appetite change, chills, fatigue and fever.  HENT:   Negative for hearing loss and voice change.   Eyes: Negative for eye problems.  Respiratory: Negative for chest tightness and cough.   Cardiovascular: Negative for chest pain.  Gastrointestinal: Negative for abdominal distention, abdominal pain and blood in stool.  Endocrine: Negative for hot flashes.  Genitourinary: Negative for difficulty urinating and frequency.   Musculoskeletal: Negative for arthralgias.  Skin: Negative for itching and rash.  Neurological: Negative for extremity weakness.  Hematological: Negative for adenopathy.  Psychiatric/Behavioral: Negative for confusion. The patient is not nervous/anxious.     MEDICAL HISTORY:  Past Medical History:  Diagnosis Date  . COPD (chronic obstructive pulmonary disease) ([place]) 11/03/2016  . DDD (degenerative disc disease), lumbar   . Polycythemia   . Ruptured  disc, cervical   . Scoliosis     SURGICAL HISTORY: Past Surgical History:  Procedure Laterality Date  . BREAST EXCISIONAL BIOPSY Right    years ago  . DILATION AND CURETTAGE OF UTERUS    . NECK SURGERY      SOCIAL HISTORY: Social History   Socioeconomic History  . Marital status: Married    Spouse name: Not on file  . Number of children: Not on file  . Years of education: Not on file  . Highest education level: Not on file  Occupational History  . Not on file  Tobacco Use  . Smoking status: Current Every Day Smoker    Packs/day: 2.00    Years: 43.00    Pack years: 86.00  . Smokeless tobacco: Never Used  Vaping Use  . Vaping Use: Never used  Substance and Sexual Activity  . Alcohol use: Yes    Comment: seldom maybe 2-3 times a year  . Drug use: No  . Sexual activity: Not Currently  Other Topics Concern  . Not on file  Social History Narrative  . Not on file   Social Determinants of Health   Financial Resource Strain:   . Difficulty of Paying Living Expenses: Not on file  Food Insecurity:   . Worried About Charity fundraiser in the Last Year: Not on file  . Ran Out of Food in the Last Year: Not on file  Transportation Needs:   . Lack of Transportation (Medical): Not on file  . Lack of Transportation (Non-Medical): Not on file  Physical Activity:   . Days of Exercise per Week: Not on file  . Minutes of Exercise per Session: Not on file  Stress:   . Feeling of Stress :  Not on file  Social Connections:   . Frequency of Communication with Friends and Family: Not on file  . Frequency of Social Gatherings with Friends and Family: Not on file  . Attends Religious Services: Not on file  . Active Member of Clubs or Organizations: Not on file  . Attends Archivist Meetings: Not on file  . Marital Status: Not on file  Intimate Partner Violence:   . Fear of Current or Ex-Partner: Not on file  . Emotionally Abused: Not on file  . Physically Abused: Not  on file  . Sexually Abused: Not on file    FAMILY HISTORY: Family History  Problem Relation Age of Onset  . Breast cancer Maternal Aunt 53  . Bone cancer Paternal Aunt   . Colon cancer Maternal Aunt     ALLERGIES:  is allergic to prochlorperazine maleate and ibuprofen.  MEDICATIONS:  Current Outpatient Medications  Medication Sig Dispense Refill  . acetaminophen (TYLENOL) 500 MG tablet Take 500 mg by mouth daily.     . Albuterol Sulfate (PROAIR RESPICLICK) 160 (90 Base) MCG/ACT AEPB Inhale 2 puffs into the lungs every 6 (six) hours as needed. 2 each 3  . amLODipine (NORVASC) 2.5 MG tablet TAKE 1 TABLET BY MOUTH EVERY DAY    . cyclobenzaprine (FLEXERIL) 5 MG tablet Take 5 mg by mouth 3 (three) times daily as needed for muscle spasms.    . fluticasone (FLONASE) 50 MCG/ACT nasal spray Place 1 spray into both nostrils daily as needed.     . Fluticasone-Umeclidin-Vilant (TRELEGY ELLIPTA) 100-62.5-25 MCG/INH AEPB Inhale 1 puff into the lungs daily. 1 each 0  . Melatonin 5 MG TABS Take 1 tablet by mouth daily.     Marland Kitchen omeprazole (PRILOSEC) 20 MG capsule Take 20 mg by mouth daily.     Marland Kitchen Respiratory Therapy Supplies (FLUTTER) DEVI Use as directed 1 each 0  . citalopram (CELEXA) 10 MG tablet Take 10 mg by mouth daily.     No current facility-administered medications for this visit.     PHYSICAL EXAMINATION: ECOG PERFORMANCE STATUS: 1 - Symptomatic but completely ambulatory Vitals:   11/15/20 0941  BP: 133/75  Pulse: (!) 58  Resp: 18  Temp: (!) 97.5 F (36.4 C)   Filed Weights   11/15/20 0941  Weight: 132 lb 9.6 oz (60.1 kg)    Physical Exam Constitutional:      General: She is not in acute distress.    Comments: Thin built.   HENT:     Head: Normocephalic and atraumatic.  Eyes:     General: No scleral icterus.    Pupils: Pupils are equal, round, and reactive to light.  Cardiovascular:     Rate and Rhythm: Normal rate and regular rhythm.     Heart sounds: Normal heart  sounds.  Pulmonary:     Effort: Pulmonary effort is normal. No respiratory distress.     Breath sounds: No wheezing.  Abdominal:     General: Bowel sounds are normal. There is no distension.     Palpations: Abdomen is soft. There is no mass.     Tenderness: There is no abdominal tenderness.  Musculoskeletal:        General: No deformity. Normal range of motion.     Cervical back: Normal range of motion and neck supple.  Skin:    General: Skin is warm and dry.     Findings: No erythema or rash.  Neurological:     Mental Status: She  is alert and oriented to person, place, and time. Mental status is at baseline.     Cranial Nerves: No cranial nerve deficit.     Coordination: Coordination normal.  Psychiatric:        Mood and Affect: Mood normal.     RADIOGRAPHIC STUDIES: I have personally reviewed the radiological images as listed and agreed with the findings in the report. No results found. LABORATORY DATA:  I have reviewed the data as listed Lab Results  Component Value Date   WBC 10.4 11/15/2020   HGB 15.2 (H) 11/15/2020   HCT 45.5 11/15/2020   MCV 94.4 11/15/2020   PLT 493 (H) 11/15/2020   Recent Labs    11/15/20 0918  NA 134*  K 3.9  CL 99  CO2 27  GLUCOSE 96  BUN 8  CREATININE 0.51  CALCIUM 9.3  GFRNONAA >60  PROT 7.3  ALBUMIN 4.1  AST 17  ALT 10  ALKPHOS 52  BILITOT 0.5   Iron/TIBC/Ferritin/ %Sat No results found for: IRON, TIBC, FERRITIN, IRONPCTSAT    JAK2 V617F mutation negative, with reflex to other mutations CALR, MPL, JAK 2 Ex 12-15 mutations negative.   ASSESSMENT & PLAN:  1. Thrombocytosis   2. Erythrocytosis   3. 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 Labs are reviewed and discussed with patient. Thrombocytosis is slightly worse. Check BCR ABL FISH  #Erythrocytosis, Hemoglobin increased to 15.2.  Elevated carbon monoxide level.  Likely due to  smoking.  Smoking cessation was discussed in details with patient. She is due for repeat CT chest lung cancer screening.  Will notify lung cancer screening program nurse navigator. Follow-up 4 months for evaluation and further discussion.  Orders Placed This Encounter  Procedures  . CBC with Differential    Standing Status:   Future    Standing Expiration Date:   11/15/2021    We spent sufficient time to discuss many aspect of care, questions were answered to patient's satisfaction. The patient knows to call the clinic with any problems questions or concerns.  Cc Tracie Harrier, MD  We spent sufficient time to discuss many aspect of care, questions were answered to patient's satisfaction.  Earlie Server, MD, PhD 11/15/2020

## 2020-11-15 NOTE — Progress Notes (Signed)
Pt here for follow up. No new concerns voiced.   

## 2020-11-16 ENCOUNTER — Telehealth: Payer: Self-pay | Admitting: *Deleted

## 2020-11-16 DIAGNOSIS — Z87891 Personal history of nicotine dependence: Secondary | ICD-10-CM

## 2020-11-16 DIAGNOSIS — Z122 Encounter for screening for malignant neoplasm of respiratory organs: Secondary | ICD-10-CM

## 2020-11-16 NOTE — Telephone Encounter (Signed)
Attempted to contact and schedule lung screening scan. Message left for patient to call back to schedule. 

## 2020-11-19 LAB — BCR-ABL1 FISH
Cells Analyzed: 200
Cells Counted: 200

## 2020-11-19 NOTE — Addendum Note (Signed)
Addended by: Lieutenant Diego on: 11/19/2020 09:17 AM   Modules accepted: Orders

## 2020-11-19 NOTE — Telephone Encounter (Signed)
Contacted and scheduled for lung screening scan. Patient is a current smoker with a 79.25 pack year history.

## 2020-12-06 ENCOUNTER — Other Ambulatory Visit: Payer: Self-pay

## 2020-12-06 ENCOUNTER — Ambulatory Visit
Admission: RE | Admit: 2020-12-06 | Discharge: 2020-12-06 | Disposition: A | Discharge status: Home / Self Care | Payer: BC Managed Care – PPO | Source: Ambulatory Visit | Attending: Nurse Practitioner | Admitting: Nurse Practitioner

## 2020-12-06 DIAGNOSIS — Z87891 Personal history of nicotine dependence: Secondary | ICD-10-CM | POA: Insufficient documentation

## 2020-12-06 DIAGNOSIS — Z122 Encounter for screening for malignant neoplasm of respiratory organs: Secondary | ICD-10-CM

## 2020-12-10 ENCOUNTER — Ambulatory Visit: Admission: RE | Admit: 2020-12-10 | Payer: BC Managed Care – PPO | Source: Ambulatory Visit

## 2020-12-10 ENCOUNTER — Telehealth: Payer: Self-pay | Admitting: *Deleted

## 2020-12-10 NOTE — Telephone Encounter (Signed)
Notified patient of LDCT lung cancer screening program results with recommendation for 3 month follow up imaging. Also notified of incidental findings noted below and is encouraged to discuss further with PCP who will receive a copy of this note and/or the CT report. Patient verbalizes understanding. Patient verbalizes understanding that she should talk to her PCP and may have antibiotics for respiratory infection. She reports that she is symptomatic with a productive cough. Dr. Linton Ham nurse will be notified by phone as well.   IMPRESSION: 1. Lung-RADS 0S, incomplete. Additional lung cancer screening CT images/or comparison to prior chest CT examinations is needed. Specifically, follow-up low-dose lung cancer screening chest CT is recommended in 3 months after trial of antimicrobial therapy and resolution of the patient's acute illness. 2. The "S" modifier above refers to potentially clinically significant non lung cancer related findings. Specifically, there is aortic atherosclerosis, in addition to left anterior descending coronary artery disease. Please note that although the presence of coronary artery calcium documents the presence of coronary artery disease, the severity of this disease and any potential stenosis cannot be assessed on this non-gated CT examination. Assessment for potential risk factor modification, dietary therapy or pharmacologic therapy may be warranted, if clinically indicated. 3. Mild diffuse bronchial wall thickening with moderate centrilobular and paraseptal emphysema; imaging findings suggestive of underlying COPD.

## 2021-02-27 ENCOUNTER — Telehealth: Payer: Self-pay | Admitting: *Deleted

## 2021-02-27 NOTE — Telephone Encounter (Signed)
Patient scheduled for chest CT 03/21/2021 at 3:30 PM her insurance has not changed and she reports smoking 1 to 2 packs of cigarettes daily.

## 2021-02-28 ENCOUNTER — Other Ambulatory Visit: Payer: Self-pay | Admitting: *Deleted

## 2021-02-28 DIAGNOSIS — Z87891 Personal history of nicotine dependence: Secondary | ICD-10-CM

## 2021-02-28 DIAGNOSIS — R918 Other nonspecific abnormal finding of lung field: Secondary | ICD-10-CM

## 2021-02-28 NOTE — Progress Notes (Signed)
Contacted and scheduled for LCS nodule follow up scan. Patient is a current smoker with a 80.25 pack year history.

## 2021-03-07 ENCOUNTER — Inpatient Hospital Stay: Payer: BC Managed Care – PPO

## 2021-03-08 ENCOUNTER — Telehealth: Payer: BC Managed Care – PPO | Admitting: Oncology

## 2021-03-08 ENCOUNTER — Inpatient Hospital Stay: Payer: BC Managed Care – PPO | Attending: Oncology

## 2021-03-08 DIAGNOSIS — D751 Secondary polycythemia: Secondary | ICD-10-CM | POA: Insufficient documentation

## 2021-03-08 DIAGNOSIS — D75839 Thrombocytosis, unspecified: Secondary | ICD-10-CM

## 2021-03-08 LAB — CBC WITH DIFFERENTIAL/PLATELET
Abs Immature Granulocytes: 0.04 10*3/uL (ref 0.00–0.07)
Basophils Absolute: 0.1 10*3/uL (ref 0.0–0.1)
Basophils Relative: 1 %
Eosinophils Absolute: 0.2 10*3/uL (ref 0.0–0.5)
Eosinophils Relative: 2 %
HCT: 43 % (ref 36.0–46.0)
Hemoglobin: 14.3 g/dL (ref 12.0–15.0)
Immature Granulocytes: 0 %
Lymphocytes Relative: 21 %
Lymphs Abs: 1.9 10*3/uL (ref 0.7–4.0)
MCH: 31.8 pg (ref 26.0–34.0)
MCHC: 33.3 g/dL (ref 30.0–36.0)
MCV: 95.8 fL (ref 80.0–100.0)
Monocytes Absolute: 0.7 10*3/uL (ref 0.1–1.0)
Monocytes Relative: 8 %
Neutro Abs: 6.3 10*3/uL (ref 1.7–7.7)
Neutrophils Relative %: 68 %
Platelets: 437 10*3/uL — ABNORMAL HIGH (ref 150–400)
RBC: 4.49 MIL/uL (ref 3.87–5.11)
RDW: 13.8 % (ref 11.5–15.5)
WBC: 9.2 10*3/uL (ref 4.0–10.5)
nRBC: 0 % (ref 0.0–0.2)

## 2021-03-11 ENCOUNTER — Inpatient Hospital Stay (HOSPITAL_BASED_OUTPATIENT_CLINIC_OR_DEPARTMENT_OTHER): Payer: BC Managed Care – PPO | Admitting: Oncology

## 2021-03-11 ENCOUNTER — Encounter: Payer: Self-pay | Admitting: Oncology

## 2021-03-11 DIAGNOSIS — D751 Secondary polycythemia: Secondary | ICD-10-CM

## 2021-03-11 DIAGNOSIS — D75839 Thrombocytosis, unspecified: Secondary | ICD-10-CM

## 2021-03-11 DIAGNOSIS — Z87891 Personal history of nicotine dependence: Secondary | ICD-10-CM | POA: Diagnosis not present

## 2021-03-11 NOTE — Progress Notes (Signed)
Patient contacted for Mychart visit. No new concerns voiced.  

## 2021-03-11 NOTE — Progress Notes (Signed)
HEMATOLOGY-ONCOLOGY TeleHEALTH VISIT PROGRESS NOTE  I connected with Yvonne Pitts on 03/11/21  at 11:00 AM EDT by video enabled telemedicine visit and verified that I am speaking with the correct person using two identifiers. I discussed the limitations, risks, security and privacy concerns of performing an evaluation and management service by telemedicine and the availability of in-person appointments. The patient expressed understanding and agreed to proceed.   Other persons participating in the visit and their role in the encounter:  None  Patient's location: Home  Provider's location: office Chief Complaint: Erythrocytosis and thrombocytosis   INTERVAL HISTORY Yvonne Pitts is a 65 y.o. female who has above history reviewed by me today presents for follow up visit for management of erythrocytosis and thrombocytosis Problems and complaints are listed below:  Patient reports that she had a fall recently and had knee Fracture.  She has an appointment with orthopedic surgery next week. She takes Tylenol for pain control. Also she has had screening lung cancer CT scan done in December 2021-she was found to have mild diffuse rhonchi wall thickening with moderate centrilobular and paraseptal emphysema.  Patchy areas of airspace consolidation throughout the lung bilaterally.  At that point, patient also has had a productive cough.  Patient was advised to discuss with primary care provider and was treated with a course of antibiotics.  She felt her cough got better. February 2022, patient was seen by primary care provider again for flulike illness.  Patient was treated for acute bronchitis with doxycycline 100 mg p.o. twice daily for 10 days.  Prednisone tapering course. Patient has chest x-ray ordered by primary care provider for follow-up.  She is also supposed to have a repeat lung cancer screening testing done 3 months after her December CT scan which is also being postponed pending on  x-ray results.  She is smoking less cigarettes recently. Review of Systems  Constitutional: Negative for appetite change, chills, fatigue and fever.  HENT:   Negative for hearing loss and voice change.   Eyes: Negative for eye problems.  Respiratory: Positive for cough. Negative for chest tightness.   Cardiovascular: Negative for chest pain.  Gastrointestinal: Negative for abdominal distention, abdominal pain and blood in stool.  Endocrine: Negative for hot flashes.  Genitourinary: Negative for difficulty urinating and frequency.   Musculoskeletal: Positive for arthralgias.  Skin: Negative for itching and rash.  Neurological: Negative for extremity weakness.  Hematological: Negative for adenopathy.  Psychiatric/Behavioral: Negative for confusion.    Past Medical History:  Diagnosis Date   COPD (chronic obstructive pulmonary disease) ([place]) 11/03/2016   DDD (degenerative disc disease), lumbar    Polycythemia    Ruptured disc, cervical    Scoliosis    Past Surgical History:  Procedure Laterality Date   BREAST EXCISIONAL BIOPSY Right    years ago   DILATION AND CURETTAGE OF UTERUS     NECK SURGERY      Family History  Problem Relation Age of Onset   Breast cancer Maternal Aunt 35   Bone cancer Paternal Aunt    Colon cancer Maternal Aunt     Social History   Socioeconomic History   Marital status: Married    Spouse name: Not on file   Number of children: Not on file   Years of education: Not on file   Highest education level: Not on file  Occupational History   Not on file  Tobacco Use   Smoking status: Current Every Day Smoker    Packs/day: 2.00  Years: 43.00    Pack years: 86.00   Smokeless tobacco: Never Used  Scientific laboratory technician Use: Never used  Substance and Sexual Activity   Alcohol use: Yes    Comment: seldom maybe 2-3 times a year   Drug use: No   Sexual activity: Not Currently  Other Topics Concern   Not on file  Social  History Narrative   Not on file   Social Determinants of Health   Financial Resource Strain: Not on file  Food Insecurity: Not on file  Transportation Needs: Not on file  Physical Activity: Not on file  Stress: Not on file  Social Connections: Not on file  Intimate Partner Violence: Not on file    Current Outpatient Medications on File Prior to Visit  Medication Sig Dispense Refill   acetaminophen (TYLENOL) 500 MG tablet Take 500 mg by mouth daily.      Albuterol Sulfate (PROAIR RESPICLICK) 629 (90 Base) MCG/ACT AEPB Inhale 2 puffs into the lungs every 6 (six) hours as needed. 2 each 3   amLODipine (NORVASC) 2.5 MG tablet TAKE 1 TABLET BY MOUTH EVERY DAY     citalopram (CELEXA) 10 MG tablet Take 10 mg by mouth daily.     cyclobenzaprine (FLEXERIL) 5 MG tablet Take 5 mg by mouth 3 (three) times daily as needed for muscle spasms.     fluticasone (FLONASE) 50 MCG/ACT nasal spray Place 1 spray into both nostrils daily as needed.      Fluticasone-Umeclidin-Vilant (TRELEGY ELLIPTA) 100-62.5-25 MCG/INH AEPB Inhale 1 puff into the lungs daily. 1 each 0   Melatonin 5 MG TABS Take 1 tablet by mouth daily.      omeprazole (PRILOSEC) 20 MG capsule Take 20 mg by mouth daily.      Respiratory Therapy Supplies (FLUTTER) DEVI Use as directed 1 each 0   traMADol (ULTRAM) 50 MG tablet Take 50 mg by mouth every 8 (eight) hours as needed. (Patient not taking: Reported on 03/11/2021)     No current facility-administered medications on file prior to visit.    Allergies  Allergen Reactions   Prochlorperazine Maleate     Other reaction(s): Unknown   Ibuprofen Rash       Observations/Objective: There were no vitals filed for this visit. There is no height or weight on file to calculate BMI.  Physical Exam Neurological:     Mental Status: She is alert.     CBC    Component Value Date/Time   WBC 9.2 03/08/2021 1041   RBC 4.49 03/08/2021 1041   HGB 14.3 03/08/2021 1041   HCT 43.0  03/08/2021 1041   PLT 437 (H) 03/08/2021 1041   MCV 95.8 03/08/2021 1041   MCH 31.8 03/08/2021 1041   MCHC 33.3 03/08/2021 1041   RDW 13.8 03/08/2021 1041   LYMPHSABS 1.9 03/08/2021 1041   MONOABS 0.7 03/08/2021 1041   EOSABS 0.2 03/08/2021 1041   BASOSABS 0.1 03/08/2021 1041    CMP     Component Value Date/Time   NA 134 (L) 11/15/2020 0918   K 3.9 11/15/2020 0918   CL 99 11/15/2020 0918   CO2 27 11/15/2020 0918   GLUCOSE 96 11/15/2020 0918   BUN 8 11/15/2020 0918   CREATININE 0.51 11/15/2020 0918   CALCIUM 9.3 11/15/2020 0918   PROT 7.3 11/15/2020 0918   ALBUMIN 4.1 11/15/2020 0918   AST 17 11/15/2020 0918   ALT 10 11/15/2020 0918   ALKPHOS 52 11/15/2020 0918   BILITOT 0.5  11/15/2020 0918   GFRNONAA >60 11/15/2020 0918   GFRAA >60 08/17/2019 1114     Assessment and Plan: 1. Thrombocytosis   2. Erythrocytosis   3. Personal history of tobacco use, presenting hazards to health     #Labs reviewed and are discussed with patient .  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  She has had previous work-up done. Hemoglobin has improved probably due to recent decrease of smoking. Encouraged her cessation efforts. 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  Improved.  Stable.  #Personal history of tobacco use. Patient will need to have a repeat CT scan done in the near future.  Currently waiting on repeat x-ray that is ordered by primary care provider.  Follow Up Instructions:  6 months  I discussed the assessment and treatment plan with the patient. The patient was provided an opportunity to ask questions and all were answered. The patient agreed with the plan and demonstrated an understanding of the instructions.  The patient was advised to call back or seek an in-person evaluation if the symptoms worsen or if the condition fails to improve as anticipated.   Earlie Server, MD 03/11/2021 7:31 PM

## 2021-03-21 ENCOUNTER — Ambulatory Visit: Admission: RE | Admit: 2021-03-21 | Payer: BC Managed Care – PPO | Source: Ambulatory Visit

## 2021-03-27 ENCOUNTER — Ambulatory Visit: Admission: RE | Admit: 2021-03-27 | Payer: BC Managed Care – PPO | Source: Ambulatory Visit

## 2021-04-04 ENCOUNTER — Ambulatory Visit
Admission: RE | Admit: 2021-04-04 | Discharge: 2021-04-04 | Disposition: A | Discharge status: Home / Self Care | Payer: BC Managed Care – PPO | Source: Ambulatory Visit | Attending: Nurse Practitioner | Admitting: Nurse Practitioner

## 2021-04-04 ENCOUNTER — Other Ambulatory Visit: Payer: Self-pay

## 2021-04-04 DIAGNOSIS — R918 Other nonspecific abnormal finding of lung field: Secondary | ICD-10-CM | POA: Diagnosis present

## 2021-04-04 DIAGNOSIS — Z87891 Personal history of nicotine dependence: Secondary | ICD-10-CM | POA: Diagnosis not present

## 2021-04-09 ENCOUNTER — Telehealth: Payer: Self-pay | Admitting: *Deleted

## 2021-04-09 NOTE — Telephone Encounter (Signed)
Notified patient of LDCT lung cancer screening program results with recommendation for 12 month follow up imaging. Also notified of incidental findings noted below and is encouraged to discuss further with PCP who will receive a copy of this note and/or the CT report. Patient verbalizes understanding.   IMPRESSION: 1. Patchy areas of pulmonary parenchymal consolidation, seen on 12/06/2020, have largely resolved in the interval. No suspicious pulmonary nodules. Lung-RADS 2, benign appearance or behavior. Continue annual screening with low-dose chest CT without contrast in 12 months. 2. Left adrenal adenoma. 3. Aortic atherosclerosis (ICD10-I70.0). Coronary artery calcification. 4.  Emphysema (ICD10-J43.9).

## 2021-08-26 ENCOUNTER — Other Ambulatory Visit: Payer: Self-pay | Admitting: Internal Medicine

## 2021-08-26 DIAGNOSIS — Z1231 Encounter for screening mammogram for malignant neoplasm of breast: Secondary | ICD-10-CM

## 2021-09-11 ENCOUNTER — Encounter: Payer: Self-pay | Admitting: Oncology

## 2021-09-11 ENCOUNTER — Inpatient Hospital Stay (HOSPITAL_BASED_OUTPATIENT_CLINIC_OR_DEPARTMENT_OTHER): Payer: Medicare HMO | Admitting: Oncology

## 2021-09-11 ENCOUNTER — Inpatient Hospital Stay: Payer: Medicare HMO | Attending: Oncology

## 2021-09-11 VITALS — BP 148/67 | HR 56 | Temp 98.0°F | Resp 17 | Wt 126.0 lb

## 2021-09-11 DIAGNOSIS — D75839 Thrombocytosis, unspecified: Secondary | ICD-10-CM | POA: Insufficient documentation

## 2021-09-11 DIAGNOSIS — Z87891 Personal history of nicotine dependence: Secondary | ICD-10-CM

## 2021-09-11 DIAGNOSIS — M810 Age-related osteoporosis without current pathological fracture: Secondary | ICD-10-CM | POA: Diagnosis not present

## 2021-09-11 DIAGNOSIS — Z8781 Personal history of (healed) traumatic fracture: Secondary | ICD-10-CM | POA: Diagnosis not present

## 2021-09-11 DIAGNOSIS — F17210 Nicotine dependence, cigarettes, uncomplicated: Secondary | ICD-10-CM | POA: Insufficient documentation

## 2021-09-11 DIAGNOSIS — J449 Chronic obstructive pulmonary disease, unspecified: Secondary | ICD-10-CM | POA: Insufficient documentation

## 2021-09-11 DIAGNOSIS — M5136 Other intervertebral disc degeneration, lumbar region: Secondary | ICD-10-CM | POA: Insufficient documentation

## 2021-09-11 DIAGNOSIS — D751 Secondary polycythemia: Secondary | ICD-10-CM

## 2021-09-11 DIAGNOSIS — M419 Scoliosis, unspecified: Secondary | ICD-10-CM | POA: Insufficient documentation

## 2021-09-11 DIAGNOSIS — Z79899 Other long term (current) drug therapy: Secondary | ICD-10-CM | POA: Insufficient documentation

## 2021-09-11 DIAGNOSIS — M25562 Pain in left knee: Secondary | ICD-10-CM | POA: Diagnosis not present

## 2021-09-11 LAB — CBC WITH DIFFERENTIAL/PLATELET
Abs Immature Granulocytes: 0.02 10*3/uL (ref 0.00–0.07)
Basophils Absolute: 0.1 10*3/uL (ref 0.0–0.1)
Basophils Relative: 1 %
Eosinophils Absolute: 0.1 10*3/uL (ref 0.0–0.5)
Eosinophils Relative: 2 %
HCT: 46.6 % — ABNORMAL HIGH (ref 36.0–46.0)
Hemoglobin: 15.3 g/dL — ABNORMAL HIGH (ref 12.0–15.0)
Immature Granulocytes: 0 %
Lymphocytes Relative: 23 %
Lymphs Abs: 2.1 10*3/uL (ref 0.7–4.0)
MCH: 31.8 pg (ref 26.0–34.0)
MCHC: 32.8 g/dL (ref 30.0–36.0)
MCV: 96.9 fL (ref 80.0–100.0)
Monocytes Absolute: 0.6 10*3/uL (ref 0.1–1.0)
Monocytes Relative: 7 %
Neutro Abs: 6.2 10*3/uL (ref 1.7–7.7)
Neutrophils Relative %: 67 %
Platelets: 436 10*3/uL — ABNORMAL HIGH (ref 150–400)
RBC: 4.81 MIL/uL (ref 3.87–5.11)
RDW: 13.3 % (ref 11.5–15.5)
WBC: 9.1 10*3/uL (ref 4.0–10.5)
nRBC: 0 % (ref 0.0–0.2)

## 2021-09-11 LAB — COMPREHENSIVE METABOLIC PANEL
ALT: 9 U/L (ref 0–44)
AST: 15 U/L (ref 15–41)
Albumin: 4.2 g/dL (ref 3.5–5.0)
Alkaline Phosphatase: 52 U/L (ref 38–126)
Anion gap: 7 (ref 5–15)
BUN: 9 mg/dL (ref 8–23)
CO2: 27 mmol/L (ref 22–32)
Calcium: 8.9 mg/dL (ref 8.9–10.3)
Chloride: 101 mmol/L (ref 98–111)
Creatinine, Ser: 0.44 mg/dL (ref 0.44–1.00)
GFR, Estimated: >60 mL/min (ref >60)
Glucose, Bld: 93 mg/dL (ref 70–99)
Potassium: 4.1 mmol/L (ref 3.5–5.1)
Sodium: 135 mmol/L (ref 135–145)
Total Bilirubin: 0.6 mg/dL (ref 0.3–1.2)
Total Protein: 7.3 g/dL (ref 6.5–8.1)

## 2021-09-11 NOTE — Progress Notes (Signed)
Hematology/Oncology progress note [facility] [phone] [phone]   Patient Care Team: Tracie Harrier, MD as PCP - General (Internal Medicine)  REFERRING PROVIDER: Tracie Harrier, MD  CHIEF COMPLAINTS/REASON FOR VISIT:  Follow up for thrombocytosis.   HISTORY OF PRESENTING ILLNESS:  Yvonne Pitts is a 65 y.o. female who was seen in consultation at the request of Tracie Harrier, MD for follow up thrombocytosis.  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 Elevated carbon Monoxide level,  JAK2 V617F, w Reflex to CALR/E12/MPL negative.  INTERVAL HISTORY Yvonne Pitts is a 65 year old female who presents today for follow-up for management of thrombocytosis.  She continues to smoke cigarettes 2 packs/day.  Denies any new concerns.  Is feeling well.  She had a fall back in March 2022 and shattered her left knee.  She continues to have some pain.  She had a bone density scan completed recently that showed osteoporosis.  She was started on Fosamax and has had 1 dose.  She is also taking calcium and vitamin D.  Review of Systems  Gastrointestinal:  Positive for constipation.  Musculoskeletal:  Positive for arthralgias.  All other systems reviewed and are negative.  MEDICAL HISTORY:  Past Medical History:  Diagnosis Date   COPD (chronic obstructive pulmonary disease) ([place]) 11/03/2016   DDD (degenerative disc disease), lumbar    Polycythemia    Ruptured disc, cervical    Scoliosis     SURGICAL HISTORY: Past Surgical History:  Procedure Laterality Date   BREAST EXCISIONAL BIOPSY Right    years ago   DILATION AND CURETTAGE OF UTERUS     NECK SURGERY      SOCIAL HISTORY: Social History   Socioeconomic History   Marital status: Married    Spouse name: Not on file   Number of children: Not on file   Years of education: Not on file   Highest education level: Not on file  Occupational History   Not on file  Tobacco Use    Smoking status: Every Day    Packs/day: 2.00    Years: 43.00    Pack years: 86.00    Types: Cigarettes   Smokeless tobacco: Never  Vaping Use   Vaping Use: Never used  Substance and Sexual Activity   Alcohol use: Yes    Comment: seldom maybe 2-3 times a year   Drug use: No   Sexual activity: Not Currently  Other Topics Concern   Not on file  Social History Narrative   Not on file   Social Determinants of Health   Financial Resource Strain: Not on file  Food Insecurity: Not on file  Transportation Needs: Not on file  Physical Activity: Not on file  Stress: Not on file  Social Connections: Not on file  Intimate Partner Violence: Not on file    FAMILY HISTORY: Family History  Problem Relation Age of Onset   Breast cancer Maternal Aunt 40   Bone cancer Paternal Aunt    Colon cancer Maternal Aunt     ALLERGIES:  is allergic to prochlorperazine maleate and ibuprofen.  MEDICATIONS:  Current Outpatient Medications  Medication Sig Dispense Refill   acetaminophen (TYLENOL) 500 MG tablet Take 500 mg by mouth daily.      Albuterol Sulfate (PROAIR RESPICLICK) 123XX123 (90 Base) MCG/ACT AEPB Inhale 2 puffs into the lungs every 6 (six) hours as needed. 2 each 3   alendronate (FOSAMAX) 70 MG tablet Take 70 mg by mouth once a week.  amLODipine (NORVASC) 2.5 MG tablet TAKE 1 TABLET BY MOUTH EVERY DAY     calcium-vitamin D (OSCAL WITH D) 250-125 MG-UNIT tablet Take 1 tablet by mouth daily.     citalopram (CELEXA) 10 MG tablet Take 10 mg by mouth daily.     cyclobenzaprine (FLEXERIL) 5 MG tablet Take 5 mg by mouth 3 (three) times daily as needed for muscle spasms.     fluticasone (FLONASE) 50 MCG/ACT nasal spray Place 1 spray into both nostrils daily as needed.      Fluticasone-Umeclidin-Vilant (TRELEGY ELLIPTA) 100-62.5-25 MCG/INH AEPB Inhale 1 puff into the lungs daily. 1 each 0   Melatonin 5 MG TABS Take 1 tablet by mouth daily.      omeprazole (PRILOSEC) 20 MG capsule Take 20 mg  by mouth daily.      Respiratory Therapy Supplies (FLUTTER) DEVI Use as directed 1 each 0   traMADol (ULTRAM) 50 MG tablet Take 50 mg by mouth every 8 (eight) hours as needed.     No current facility-administered medications for this visit.     PHYSICAL EXAMINATION: ECOG PERFORMANCE STATUS: 1 - Symptomatic but completely ambulatory Vitals:   09/11/21 1026  BP: (!) 148/67  Pulse: (!) 56  Resp: 17  Temp: 98 F (36.7 C)  SpO2: 99%   Filed Weights   09/11/21 1026  Weight: 126 lb (57.2 kg)    Physical Exam Constitutional:      Appearance: Normal appearance.  HENT:     Head: Normocephalic and atraumatic.  Eyes:     Pupils: Pupils are equal, round, and reactive to light.  Cardiovascular:     Rate and Rhythm: Normal rate and regular rhythm.     Heart sounds: Normal heart sounds. No murmur heard. Pulmonary:     Effort: Pulmonary effort is normal.     Breath sounds: Normal breath sounds. No wheezing.  Abdominal:     General: Bowel sounds are normal. There is no distension.     Palpations: Abdomen is soft.     Tenderness: There is no abdominal tenderness.  Musculoskeletal:        General: Normal range of motion.     Cervical back: Normal range of motion.  Skin:    General: Skin is warm and dry.     Findings: No rash.  Neurological:     Mental Status: She is alert and oriented to person, place, and time.  Psychiatric:        Judgment: Judgment normal.    RADIOGRAPHIC STUDIES: I have personally reviewed the radiological images as listed and agreed with the findings in the report. No results found. LABORATORY DATA:  I have reviewed the data as listed Lab Results  Component Value Date   WBC 9.1 09/11/2021   HGB 15.3 (H) 09/11/2021   HCT 46.6 (H) 09/11/2021   MCV 96.9 09/11/2021   PLT 436 (H) 09/11/2021   Recent Labs    11/15/20 0918 09/11/21 1004  NA 134* 135  K 3.9 4.1  CL 99 101  CO2 27 27  GLUCOSE 96 93  BUN 8 9  CREATININE 0.51 0.44  CALCIUM 9.3 8.9   GFRNONAA >60 >60  PROT 7.3 7.3  ALBUMIN 4.1 4.2  AST 17 15  ALT 10 9  ALKPHOS 52 52  BILITOT 0.5 0.6    Iron/TIBC/Ferritin/ %Sat No results found for: IRON, TIBC, FERRITIN, IRONPCTSAT    JAK2 V617F mutation negative, with reflex to other mutations CALR, MPL, JAK 2 Ex 12-15 mutations  negative.   ASSESSMENT & PLAN:  No diagnosis found.  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  Lab work to date has essentially been unremarkable most recently BCR able and FISH were negative.  Lab work from today shows a normal CMP with a hemoglobin of 15.3 and a platelet count of 436.  Differential is normal.  Erythrocytosis- Hemoglobin is 15.3 which is very stable from previous.  History of smoking- She is smoked 2 packs of cigarettes per day for quite some time.  She is enrolled in the low-dose CT screening program.  Her last scan was in March 2022.  We will refer her to our new low-dose CT screening program with pulmonology.  Referral sent.  Osteopenia- She was recently started on Fosamax.  She continues calcium and vitamin D supplements.  Disposition- Return to clinic in 6 months with repeat lab work and to see Dr. Tasia Catchings.  I spent 20 minutes dedicated to the care of this patient (face-to-face and non-face-to-face) on the date of the encounter to include what is described in the assessment and plan.  No orders of the defined types were placed in this encounter.   We spent sufficient time to discuss many aspect of care, questions were answered to patient's satisfaction. The patient knows to call the clinic with any problems questions or concerns.  Cc Tracie Harrier, MD  We spent sufficient time to discuss many aspect of care, questions were answered to patient's satisfaction. Faythe Casa, NP 09/11/2021 12:10 PM

## 2021-09-11 NOTE — Progress Notes (Signed)
Patient here for oncology follow-up appointment, expresses no complaints or concerns at this time.    

## 2021-10-25 ENCOUNTER — Other Ambulatory Visit: Payer: Self-pay | Admitting: *Deleted

## 2021-10-25 DIAGNOSIS — Z87891 Personal history of nicotine dependence: Secondary | ICD-10-CM

## 2021-10-25 DIAGNOSIS — F17210 Nicotine dependence, cigarettes, uncomplicated: Secondary | ICD-10-CM

## 2022-03-11 ENCOUNTER — Inpatient Hospital Stay: Payer: Medicare HMO | Attending: Oncology

## 2022-03-11 ENCOUNTER — Ambulatory Visit: Payer: Medicare HMO | Admitting: Oncology

## 2022-03-11 ENCOUNTER — Encounter: Payer: Self-pay | Admitting: Oncology

## 2022-03-11 ENCOUNTER — Other Ambulatory Visit: Payer: Self-pay

## 2022-03-11 ENCOUNTER — Inpatient Hospital Stay (HOSPITAL_BASED_OUTPATIENT_CLINIC_OR_DEPARTMENT_OTHER): Payer: Medicare HMO | Admitting: Oncology

## 2022-03-11 VITALS — BP 133/70 | HR 65 | Temp 98.4°F | Resp 16 | Wt 126.4 lb

## 2022-03-11 DIAGNOSIS — D751 Secondary polycythemia: Secondary | ICD-10-CM

## 2022-03-11 DIAGNOSIS — Z803 Family history of malignant neoplasm of breast: Secondary | ICD-10-CM | POA: Insufficient documentation

## 2022-03-11 DIAGNOSIS — Z79899 Other long term (current) drug therapy: Secondary | ICD-10-CM | POA: Diagnosis not present

## 2022-03-11 DIAGNOSIS — F17210 Nicotine dependence, cigarettes, uncomplicated: Secondary | ICD-10-CM | POA: Insufficient documentation

## 2022-03-11 DIAGNOSIS — M5136 Other intervertebral disc degeneration, lumbar region: Secondary | ICD-10-CM | POA: Diagnosis not present

## 2022-03-11 DIAGNOSIS — J449 Chronic obstructive pulmonary disease, unspecified: Secondary | ICD-10-CM | POA: Insufficient documentation

## 2022-03-11 DIAGNOSIS — D75839 Thrombocytosis, unspecified: Secondary | ICD-10-CM | POA: Insufficient documentation

## 2022-03-11 DIAGNOSIS — M419 Scoliosis, unspecified: Secondary | ICD-10-CM | POA: Diagnosis not present

## 2022-03-11 DIAGNOSIS — M8580 Other specified disorders of bone density and structure, unspecified site: Secondary | ICD-10-CM | POA: Insufficient documentation

## 2022-03-11 DIAGNOSIS — Z800 Family history of malignant neoplasm of digestive organs: Secondary | ICD-10-CM | POA: Insufficient documentation

## 2022-03-11 DIAGNOSIS — Z87891 Personal history of nicotine dependence: Secondary | ICD-10-CM

## 2022-03-11 LAB — CBC WITH DIFFERENTIAL/PLATELET
Abs Immature Granulocytes: 0.03 10*3/uL (ref 0.00–0.07)
Basophils Absolute: 0.1 10*3/uL (ref 0.0–0.1)
Basophils Relative: 1 %
Eosinophils Absolute: 0.2 10*3/uL (ref 0.0–0.5)
Eosinophils Relative: 2 %
HCT: 45.3 % (ref 36.0–46.0)
Hemoglobin: 15.1 g/dL — ABNORMAL HIGH (ref 12.0–15.0)
Immature Granulocytes: 0 %
Lymphocytes Relative: 29 %
Lymphs Abs: 2.9 10*3/uL (ref 0.7–4.0)
MCH: 32.2 pg (ref 26.0–34.0)
MCHC: 33.3 g/dL (ref 30.0–36.0)
MCV: 96.6 fL (ref 80.0–100.0)
Monocytes Absolute: 0.7 10*3/uL (ref 0.1–1.0)
Monocytes Relative: 6 %
Neutro Abs: 6.4 10*3/uL (ref 1.7–7.7)
Neutrophils Relative %: 62 %
Platelets: 461 10*3/uL — ABNORMAL HIGH (ref 150–400)
RBC: 4.69 MIL/uL (ref 3.87–5.11)
RDW: 13.5 % (ref 11.5–15.5)
WBC: 10.2 10*3/uL (ref 4.0–10.5)
nRBC: 0 % (ref 0.0–0.2)

## 2022-03-11 LAB — COMPREHENSIVE METABOLIC PANEL
ALT: 11 U/L (ref 0–44)
AST: 16 U/L (ref 15–41)
Albumin: 4 g/dL (ref 3.5–5.0)
Alkaline Phosphatase: 50 U/L (ref 38–126)
Anion gap: 7 (ref 5–15)
BUN: 10 mg/dL (ref 8–23)
CO2: 26 mmol/L (ref 22–32)
Calcium: 9.4 mg/dL (ref 8.9–10.3)
Chloride: 99 mmol/L (ref 98–111)
Creatinine, Ser: 0.55 mg/dL (ref 0.44–1.00)
GFR, Estimated: >60 mL/min (ref >60)
Glucose, Bld: 95 mg/dL (ref 70–99)
Potassium: 3.9 mmol/L (ref 3.5–5.1)
Sodium: 132 mmol/L — ABNORMAL LOW (ref 135–145)
Total Bilirubin: 0.2 mg/dL — ABNORMAL LOW (ref 0.3–1.2)
Total Protein: 7.1 g/dL (ref 6.5–8.1)

## 2022-03-11 NOTE — Progress Notes (Signed)
?Hematology/Oncology progress note ?[place] ?[phone] [phone] ? ? ?Patient Care Team: ?Tracie Harrier, MD as PCP - General (Internal Medicine) ? ?REFERRING PROVIDER: ?Tracie Harrier, MD  ?CHIEF COMPLAINTS/REASON FOR VISIT:  ?Follow up for thrombocytosis.  ? ?HISTORY OF PRESENTING ILLNESS:  ?Yvonne Pitts is a 66 y.o. female who was seen in consultation at the request of Tracie Harrier, MD for follow up thrombocytosis.  ?Thrombocytosis is likely due to smoking. ?Elevated carbon Monoxide level,  ?JAK2 V617F, w Reflex to CALR/E12/MPL negative. ? ?INTERVAL HISTORY ?Yvonne Pitts is a 66 year old female who presents today for follow-up for management of thrombocytosis.  She continues to smoke cigarettes 2 packs/day.  Has some chronic upper and lower back pain that waxes and wanes.  Shattered her left knee back in March 2022 and still has some knee issues but overall has recovered.  She continues Fosamax and calcium and vitamin D for osteoporosis.  Still participates in the low-dose CT screening program and next scan is in May. ? ? ?Review of Systems  ?Constitutional:  Negative for appetite change, fatigue, fever and unexpected weight change.  ?HENT:   Negative for nosebleeds, sore throat and trouble swallowing.   ?Eyes: Negative.   ?Respiratory: Negative.  Negative for cough, shortness of breath and wheezing.   ?Cardiovascular: Negative.  Negative for chest pain and leg swelling.  ?Gastrointestinal:  Negative for abdominal pain, blood in stool, constipation, diarrhea, nausea and vomiting.  ?Endocrine: Negative.   ?Genitourinary: Negative.  Negative for bladder incontinence, hematuria and nocturia.   ?Musculoskeletal: Negative.  Negative for back pain and flank pain.  ?Skin: Negative.   ?Neurological: Negative.  Negative for dizziness, headaches, light-headedness and numbness.  ?Hematological: Negative.   ?Psychiatric/Behavioral: Negative.  Negative for  confusion. The patient is not nervous/anxious.   ? ?MEDICAL HISTORY:  ?Past Medical History:  ?Diagnosis Date  ? COPD (chronic obstructive pulmonary disease) ([place]) 11/03/2016  ? DDD (degenerative disc disease), lumbar   ? Polycythemia   ? Ruptured disc, cervical   ? Scoliosis   ? ? ?SURGICAL HISTORY: ?Past Surgical History:  ?Procedure Laterality Date  ? BREAST EXCISIONAL BIOPSY Right   ? years ago  ? DILATION AND CURETTAGE OF UTERUS    ? NECK SURGERY    ? ? ?SOCIAL HISTORY: ?Social History  ? ?Socioeconomic History  ? Marital status: Married  ?  Spouse name: Not on file  ? Number of children: Not on file  ? Years of education: Not on file  ? Highest education level: Not on file  ?Occupational History  ? Not on file  ?Tobacco Use  ? Smoking status: Every Day  ?  Packs/day: 2.00  ?  Years: 43.00  ?  Pack years: 86.00  ?  Types: Cigarettes  ? Smokeless tobacco: Never  ?Vaping Use  ? Vaping Use: Never used  ?Substance and Sexual Activity  ? Alcohol use: Yes  ?  Comment: seldom maybe 2-3 times a year  ? Drug use: No  ? Sexual activity: Not Currently  ?Other Topics Concern  ? Not on file  ?Social History Narrative  ? Not on file  ? ?Social Determinants of Health  ? ?Financial Resource Strain: Not on file  ?Food Insecurity: Not on file  ?Transportation Needs: Not on file  ?Physical Activity: Not on file  ?Stress: Not on file  ?Social Connections: Not on file  ?Intimate Partner Violence: Not on file  ? ? ?FAMILY HISTORY: ?Family History  ?Problem Relation Age of  Onset  ? Breast cancer Maternal Aunt 62  ? Bone cancer Paternal Aunt   ? Colon cancer Maternal Aunt   ? ? ?ALLERGIES:  is allergic to prochlorperazine maleate and ibuprofen. ? ?MEDICATIONS:  ?Current Outpatient Medications  ?Medication Sig Dispense Refill  ? acetaminophen (TYLENOL) 500 MG tablet Take 500 mg by mouth daily.     ? Albuterol Sulfate (PROAIR RESPICLICK) 397 (90 Base) MCG/ACT AEPB Inhale 2 puffs into the lungs every 6 (six) hours as needed. 2 each 3  ?  amLODipine (NORVASC) 2.5 MG tablet TAKE 1 TABLET BY MOUTH EVERY DAY    ? budesonide-formoterol (SYMBICORT) 160-4.5 MCG/ACT inhaler INHALE 2 INHALATIONS INTO THE LUNGS 2 TIMES DAILY    ? cyclobenzaprine (FLEXERIL) 5 MG tablet Take 5 mg by mouth 3 (three) times daily as needed for muscle spasms.    ? fluticasone (FLONASE) 50 MCG/ACT nasal spray Place 1 spray into both nostrils daily as needed.     ? Melatonin 5 MG TABS Take 1 tablet by mouth daily.     ? omeprazole (PRILOSEC) 20 MG capsule Take 20 mg by mouth daily.     ? Respiratory Therapy Supplies (FLUTTER) DEVI Use as directed 1 each 0  ? alendronate (FOSAMAX) 70 MG tablet Take 70 mg by mouth once a week. (Patient not taking: Reported on 03/11/2022)    ? calcium-vitamin D (OSCAL WITH D) 250-125 MG-UNIT tablet Take 1 tablet by mouth daily. (Patient not taking: Reported on 03/11/2022)    ? citalopram (CELEXA) 10 MG tablet Take 10 mg by mouth daily. (Patient not taking: Reported on 03/11/2022)    ? Fluticasone-Umeclidin-Vilant (TRELEGY ELLIPTA) 100-62.5-25 MCG/INH AEPB Inhale 1 puff into the lungs daily. (Patient not taking: Reported on 03/11/2022) 1 each 0  ? traMADol (ULTRAM) 50 MG tablet Take 50 mg by mouth every 8 (eight) hours as needed. (Patient not taking: Reported on 03/11/2022)    ? ?No current facility-administered medications for this visit.  ? ? ? ?PHYSICAL EXAMINATION: ?ECOG PERFORMANCE STATUS: 1 - Symptomatic but completely ambulatory ?Vitals:  ? 03/11/22 1017  ?BP: 133/70  ?Pulse: 65  ?Resp: 16  ?Temp: 98.4 ?F (36.9 ?C)  ?SpO2: 99%  ? ?Filed Weights  ? 03/11/22 1017  ?Weight: 126 lb 6.4 oz (57.3 kg)  ? ? ?Physical Exam ?Constitutional:   ?   Appearance: Normal appearance.  ?HENT:  ?   Head: Normocephalic and atraumatic.  ?Eyes:  ?   Pupils: Pupils are equal, round, and reactive to light.  ?Cardiovascular:  ?   Rate and Rhythm: Normal rate and regular rhythm.  ?   Heart sounds: Normal heart sounds. No murmur heard. ?Pulmonary:  ?   Effort: Pulmonary effort  is normal.  ?   Breath sounds: Normal breath sounds. No wheezing.  ?Abdominal:  ?   General: Bowel sounds are normal. There is no distension.  ?   Palpations: Abdomen is soft.  ?   Tenderness: There is no abdominal tenderness.  ?Musculoskeletal:     ?   General: Normal range of motion.  ?   Cervical back: Normal range of motion.  ?Skin: ?   General: Skin is warm and dry.  ?   Findings: No rash.  ?Neurological:  ?   Mental Status: She is alert and oriented to person, place, and time.  ?Psychiatric:     ?   Judgment: Judgment normal.  ? ? ?RADIOGRAPHIC STUDIES: ?I have personally reviewed the radiological images as listed and agreed with the  findings in the report. ?No results found. ?LABORATORY DATA:  ?I have reviewed the data as listed ?Lab Results  ?Component Value Date  ? WBC 10.2 03/11/2022  ? HGB 15.1 (H) 03/11/2022  ? HCT 45.3 03/11/2022  ? MCV 96.6 03/11/2022  ? PLT 461 (H) 03/11/2022  ? ?Recent Labs  ?  09/11/21 ?1004 03/11/22 ?1004  ?NA 135 132*  ?K 4.1 3.9  ?CL 101 99  ?CO2 27 26  ?GLUCOSE 93 95  ?BUN 9 10  ?CREATININE 0.44 0.55  ?CALCIUM 8.9 9.4  ?GFRNONAA >60 >60  ?PROT 7.3 7.1  ?ALBUMIN 4.2 4.0  ?AST 15 16  ?ALT 9 11  ?ALKPHOS 52 50  ?BILITOT 0.6 0.2*  ? ?Iron/TIBC/Ferritin/ %Sat ?No results found for: IRON, TIBC, FERRITIN, IRONPCTSAT  ? ? ?JAK2 V617F mutation negative, with reflex to other mutations CALR, MPL, JAK 2 Ex 12-15 mutations negative. ? ? ?ASSESSMENT & PLAN:  ?1. Personal history of tobacco use, presenting hazards to health   ?2. Thrombocytosis   ? ?Chronic thrombocytosis and erythrocytosis- ?Secondary to smoking.  Lab work to date has essentially been unremarkable most recently BCR able and FISH were negative.  Labs from today show a hemoglobin of 15.1 and a platelet count of 461,000.  CMP and differential are normal. ? ?History of smoking- ?She is smoked 2 packs of cigarettes per day for quite some time.  She is enrolled in the low-dose CT screening program.  Her last scan was in March 2022.   Scheduled for next scan in the next couple months. ? ?Osteopenia- ?She was recently started on Fosamax.  She continues calcium and vitamin D supplements. ? ?Disposition- ?Return to clinic in 6 months for

## 2022-03-11 NOTE — Progress Notes (Signed)
Pt states she has been having trouble with her back and knees pain as well as tight muscles recently.  ?

## 2022-03-31 ENCOUNTER — Ambulatory Visit
Admission: RE | Admit: 2022-03-31 | Discharge: 2022-03-31 | Disposition: A | Discharge status: Home / Self Care | Payer: Medicare HMO | Source: Ambulatory Visit | Attending: Internal Medicine | Admitting: Internal Medicine

## 2022-03-31 DIAGNOSIS — Z1231 Encounter for screening mammogram for malignant neoplasm of breast: Secondary | ICD-10-CM | POA: Diagnosis not present

## 2022-04-07 ENCOUNTER — Ambulatory Visit
Admission: RE | Admit: 2022-04-07 | Discharge: 2022-04-07 | Disposition: A | Discharge status: Home / Self Care | Payer: Medicare HMO | Source: Ambulatory Visit | Attending: Acute Care | Admitting: Acute Care

## 2022-04-07 DIAGNOSIS — F17210 Nicotine dependence, cigarettes, uncomplicated: Secondary | ICD-10-CM | POA: Insufficient documentation

## 2022-04-07 DIAGNOSIS — Z87891 Personal history of nicotine dependence: Secondary | ICD-10-CM | POA: Diagnosis present

## 2022-04-08 ENCOUNTER — Other Ambulatory Visit: Payer: Self-pay

## 2022-04-08 DIAGNOSIS — Z87891 Personal history of nicotine dependence: Secondary | ICD-10-CM

## 2022-04-08 DIAGNOSIS — Z122 Encounter for screening for malignant neoplasm of respiratory organs: Secondary | ICD-10-CM

## 2022-04-08 DIAGNOSIS — F17210 Nicotine dependence, cigarettes, uncomplicated: Secondary | ICD-10-CM

## 2022-07-16 ENCOUNTER — Emergency Department: Payer: Medicare HMO

## 2022-07-16 ENCOUNTER — Other Ambulatory Visit: Payer: Self-pay | Admitting: Internal Medicine

## 2022-07-16 ENCOUNTER — Emergency Department
Admission: EM | Admit: 2022-07-16 | Discharge: 2022-07-16 | Disposition: A | Discharge status: Home / Self Care | Payer: Medicare HMO | Source: Home / Self Care | Attending: Emergency Medicine | Admitting: Emergency Medicine

## 2022-07-16 ENCOUNTER — Other Ambulatory Visit: Payer: Self-pay

## 2022-07-16 ENCOUNTER — Encounter: Payer: Self-pay | Admitting: *Deleted

## 2022-07-16 DIAGNOSIS — R410 Disorientation, unspecified: Secondary | ICD-10-CM | POA: Insufficient documentation

## 2022-07-16 DIAGNOSIS — Z720 Tobacco use: Secondary | ICD-10-CM | POA: Insufficient documentation

## 2022-07-16 DIAGNOSIS — F4489 Other dissociative and conversion disorders: Secondary | ICD-10-CM

## 2022-07-16 DIAGNOSIS — J449 Chronic obstructive pulmonary disease, unspecified: Secondary | ICD-10-CM | POA: Insufficient documentation

## 2022-07-16 DIAGNOSIS — R4182 Altered mental status, unspecified: Secondary | ICD-10-CM | POA: Diagnosis not present

## 2022-07-16 DIAGNOSIS — R4701 Aphasia: Secondary | ICD-10-CM | POA: Insufficient documentation

## 2022-07-16 DIAGNOSIS — R451 Restlessness and agitation: Secondary | ICD-10-CM

## 2022-07-16 DIAGNOSIS — R4789 Other speech disturbances: Secondary | ICD-10-CM

## 2022-07-16 DIAGNOSIS — F32A Depression, unspecified: Secondary | ICD-10-CM | POA: Diagnosis not present

## 2022-07-16 LAB — HEPATIC FUNCTION PANEL
ALT: 14 U/L (ref 0–44)
AST: 15 U/L (ref 15–41)
Albumin: 3.9 g/dL (ref 3.5–5.0)
Alkaline Phosphatase: 51 U/L (ref 38–126)
Bilirubin, Direct: <0.1 mg/dL (ref 0.0–0.2)
Total Bilirubin: 0.4 mg/dL (ref 0.3–1.2)
Total Protein: 6.8 g/dL (ref 6.5–8.1)

## 2022-07-16 LAB — BASIC METABOLIC PANEL
Anion gap: 6 (ref 5–15)
BUN: 6 mg/dL — ABNORMAL LOW (ref 8–23)
CO2: 26 mmol/L (ref 22–32)
Calcium: 9 mg/dL (ref 8.9–10.3)
Chloride: 108 mmol/L (ref 98–111)
Creatinine, Ser: 0.49 mg/dL (ref 0.44–1.00)
GFR, Estimated: >60 mL/min (ref >60)
Glucose, Bld: 111 mg/dL — ABNORMAL HIGH (ref 70–99)
Potassium: 3.7 mmol/L (ref 3.5–5.1)
Sodium: 140 mmol/L (ref 135–145)

## 2022-07-16 LAB — URINE DRUG SCREEN, QUALITATIVE (ARMC ONLY)
Amphetamines, Ur Screen: NOT DETECTED
Barbiturates, Ur Screen: NOT DETECTED
Benzodiazepine, Ur Scrn: NOT DETECTED
Cannabinoid 50 Ng, Ur ~~LOC~~: NOT DETECTED
Cocaine Metabolite,Ur ~~LOC~~: NOT DETECTED
MDMA (Ecstasy)Ur Screen: NOT DETECTED
Methadone Scn, Ur: NOT DETECTED
Opiate, Ur Screen: NOT DETECTED
Phencyclidine (PCP) Ur S: NOT DETECTED
Tricyclic, Ur Screen: POSITIVE — AB

## 2022-07-16 LAB — URINALYSIS, ROUTINE W REFLEX MICROSCOPIC
Bilirubin Urine: NEGATIVE
Glucose, UA: NEGATIVE mg/dL
Hgb urine dipstick: NEGATIVE
Ketones, ur: NEGATIVE mg/dL
Nitrite: NEGATIVE
Protein, ur: NEGATIVE mg/dL
Specific Gravity, Urine: 1.014 (ref 1.005–1.030)
pH: 5 (ref 5.0–8.0)

## 2022-07-16 LAB — BLOOD GAS, VENOUS
Acid-Base Excess: 2.5 mmol/L — ABNORMAL HIGH (ref 0.0–2.0)
Bicarbonate: 27.9 mmol/L (ref 20.0–28.0)
O2 Saturation: 67.2 %
Patient temperature: 37
pCO2, Ven: 45 mmHg (ref 44–60)
pH, Ven: 7.4 (ref 7.25–7.43)
pO2, Ven: 38 mmHg (ref 32–45)

## 2022-07-16 LAB — CBC
HCT: 43.1 % (ref 36.0–46.0)
Hemoglobin: 13.9 g/dL (ref 12.0–15.0)
MCH: 31.4 pg (ref 26.0–34.0)
MCHC: 32.3 g/dL (ref 30.0–36.0)
MCV: 97.5 fL (ref 80.0–100.0)
Platelets: 472 10*3/uL — ABNORMAL HIGH (ref 150–400)
RBC: 4.42 MIL/uL (ref 3.87–5.11)
RDW: 13.1 % (ref 11.5–15.5)
WBC: 9.3 10*3/uL (ref 4.0–10.5)
nRBC: 0 % (ref 0.0–0.2)

## 2022-07-16 LAB — TROPONIN I (HIGH SENSITIVITY): Troponin I (High Sensitivity): 4 ng/L (ref <18)

## 2022-07-16 LAB — TSH: TSH: 0.863 u[IU]/mL (ref 0.350–4.500)

## 2022-07-16 MED ORDER — LORAZEPAM 2 MG/ML IJ SOLN
0.5000 mg | Freq: Once | INTRAMUSCULAR | Status: AC
  Administered 2022-07-16: 0.5 mg via INTRAVENOUS
  Filled 2022-07-16: qty 1

## 2022-07-16 MED ORDER — GADOBUTROL 1 MMOL/ML IV SOLN
5.0000 mL | Freq: Once | INTRAVENOUS | Status: AC | PRN
  Administered 2022-07-16: 5 mL via INTRAVENOUS

## 2022-07-16 MED ORDER — IOHEXOL 350 MG/ML SOLN
100.0000 mL | Freq: Once | INTRAVENOUS | Status: AC | PRN
  Administered 2022-07-16: 75 mL via INTRAVENOUS

## 2022-07-16 NOTE — ED Notes (Signed)
Pt updated that her husband left to get their son.  Pt is reporting she wants to leave.

## 2022-07-16 NOTE — ED Notes (Signed)
Patient transported to CT 

## 2022-07-16 NOTE — ED Notes (Signed)
Pt returned from MRI °

## 2022-07-16 NOTE — ED Notes (Signed)
Lab mae aware of add-on blood and urine labs.

## 2022-07-16 NOTE — ED Notes (Signed)
Patient transported to MRI 

## 2022-07-16 NOTE — ED Triage Notes (Signed)
Pt to triage via wheelchair.  Pt has stuttering for 3-4 days   pt also reports difficulty speaking for 3-4 days.  Recent uti.  Pt sent from [facility] for eval.  Pt alert.  No headache.  No chest pain   hx copd.  Cig smoker.  Pt reports weakness all over.  No v/d.

## 2022-07-16 NOTE — ED Notes (Signed)
Pt to MRI

## 2022-07-16 NOTE — ED Notes (Signed)
Patient transported to X-ray 

## 2022-07-16 NOTE — ED Notes (Signed)
ED Provider at bedside. 

## 2022-07-16 NOTE — ED Triage Notes (Signed)
First Nurse Note:  Arrives from [facility].  Last several weeks sleep has been poor.  Recently started on Seroquel, symptoms not improving.  Patient agitated with spouse.  Life stressors include an autistic child.  Patient arrives AAOx3.  Stating "He (husband) thinks I'm crazy.  The neighbors have been talking about me"

## 2022-07-16 NOTE — ED Notes (Signed)
[name] reports Pt has been increasingly argumentative and had racing thoughts/ very talkative x 3 weeks.  Sts she is "all over the place" w/ conversations.

## 2022-07-16 NOTE — ED Notes (Signed)
MD calling pt's husband per husband request.

## 2022-07-16 NOTE — ED Provider Notes (Signed)
[facility] Provider Note    Event Date/Time   First MD Initiated Contact with Patient 07/16/22 1550     (approximate)   History   Altered Mental Status   HPI  Yvonne Pitts is a 66 y.o. female with past history of tobacco abuse, COPD, DDD, scoliosis presents after being referred from clinic for further evaluation of altered mental status.  History is obtained from patient and husband at bedside.  They report that she seems to get "off" around the beginning of the month which was treated for UTI and sinusitis with Bactrim.  She has not had any recent burning with urination or sinusitis, cough, fevers, chills husband states that she has been very forgetful and intermittently confused.  Patient reports that over the last couple days she feels he has had significantly difficulty intermittently getting her words out.  They do have a autistic son at home that has been stressful.  Patient has been sleeping very poorly and seems was recently started on Seroquel for this as well as to possibly help with her mood as she has been intermittently upset with her husband.  Patient and husband also report that she has a chronic asymmetry of her face and she reports some chronic weakness in her right shoulder but no other acute neurological symptoms.    Past Medical History:  Diagnosis Date   COPD (chronic obstructive pulmonary disease) ([place]) 11/03/2016   DDD (degenerative disc disease), lumbar    Polycythemia    Ruptured disc, cervical    Scoliosis      Physical Exam  Triage Vital Signs: ED Triage Vitals  Enc Vitals Group     BP 07/16/22 1517 121/68     Pulse Rate 07/16/22 1517 68     Resp 07/16/22 1517 16     Temp 07/16/22 1517 98.2 F (36.8 C)     Temp Source 07/16/22 1517 Oral     SpO2 07/16/22 1517 97 %     Weight 07/16/22 1517 118 lb (53.5 kg)     Height 07/16/22 1517 '5\' 4"'$  (1.626 m)     Head Circumference --      Peak Flow --      Pain Score 07/16/22  1534 0     Pain Loc --      Pain Edu? --      Excl. in [place]? --     Most recent vital signs: Vitals:   07/16/22 2200 07/16/22 2255  BP: (!) 106/55   Pulse: 72   Resp: 14   Temp:  98.4 F (36.9 C)  SpO2: 92%     General: Awake, no distress.  CV:  Good peripheral perfusion.  2+ radial pulse. Resp:  Normal effort.  Clear bilaterally. Abd:  No distention.  Soft. Other:  Cranial nerves II through XII are grossly intact with exception of what appears to be right-sided facial droop.  Patient is slightly weaker in the right arm she states is chronic but otherwise symmetric strength in extremities and sensation is intact light touch throughout.  She is oriented x3.    ED Results / Procedures / Treatments  Labs (all labs ordered are listed, but only abnormal results are displayed) Labs Reviewed  BASIC METABOLIC PANEL - Abnormal; Notable for the following components:      Result Value   Glucose, Bld 111 (*)    BUN 6 (*)    All other components within normal limits  CBC - Abnormal; Notable for  the following components:   Platelets 472 (*)    All other components within normal limits  URINALYSIS, ROUTINE W REFLEX MICROSCOPIC - Abnormal; Notable for the following components:   Color, Urine YELLOW (*)    APPearance HAZY (*)    Leukocytes,Ua TRACE (*)    Bacteria, UA RARE (*)    All other components within normal limits  URINE DRUG SCREEN, QUALITATIVE ([facility] ONLY) - Abnormal; Notable for the following components:   Tricyclic, Ur Screen POSITIVE (*)    All other components within normal limits  BLOOD GAS, VENOUS - Abnormal; Notable for the following components:   Acid-Base Excess 2.5 (*)    All other components within normal limits  URINE CULTURE  TSH  HEPATIC FUNCTION PANEL  TROPONIN I (HIGH SENSITIVITY)     EKG  ECG is remarkable sinus rhythm with a ventricular rate of 78, normal axis, unremarkable intervals with some nonspecific ST change in lead I and lead II versus artifact  without other clear evidence of acute ischemia or significant arrhythmia.   RADIOLOGY  Chest reviewed by myself shows no focal consoidation, effusion, edema, pneumothorax or other clear acute thoracic process. I also reviewed radiology interpretation and agree with findings described.  CTA head and neck, interpretation without evidence of hemorrhage, ischemia, edema, mass effect, dissection or large vessel occlusion.  I reviewed radiology interpretation and agree their findings in addition to notation of outpouching from the right ICA terminus favored to be an infundibulum versus tiny aneurysm.  No other acute process.  MR brain without contrast my interpretation without clear evidence of acute ischemia.  I reviewed radiology interpretation and agree with some nonspecific findings in the periventricular white matter and central pons poss related to central pontine melanosis versus other nonspecific cause.  MR brain with contrast on my interpretation without evidence of ischemia or acute hemorrhage.  I reviewed radiology interpretation and agree their findings.  PROCEDURES:  Critical Care performed: No  .1-3 Lead EKG Interpretation  Performed by: Lucrezia Starch, MD Authorized by: Lucrezia Starch, MD     Interpretation: normal     ECG rate assessment: normal     Rhythm: sinus rhythm     Ectopy: none     Conduction: normal     The patient is on the cardiac monitor to evaluate for evidence of arrhythmia and/or significant heart rate changes.   MEDICATIONS ORDERED IN ED: Medications  iohexol (OMNIPAQUE) 350 MG/ML injection 100 mL (75 mLs Intravenous Contrast Given 07/16/22 1701)  LORazepam (ATIVAN) injection 0.5 mg (0.5 mg Intravenous Given 07/16/22 2030)  gadobutrol (GADAVIST) 1 MMOL/ML injection 5 mL (5 mLs Intravenous Contrast Given 07/16/22 2240)     IMPRESSION / MDM / ASSESSMENT AND PLAN / ED COURSE  I reviewed the triage vital signs and the nursing notes. Patient's  presentation is most consistent with acute presentation with potential threat to life or bodily function.                               Differential diagnosis includes, but is not limited to CVA, acute infectious process, metabolic derangement, endocrine derangement, polypharmacy.  ECG is remarkable sinus rhythm with a ventricular rate of 78, normal axis, unremarkable intervals with some nonspecific ST change in lead I and lead II versus artifact without other clear evidence of acute ischemia or significant arrhythmia.  Chest reviewed by myself shows no focal consoidation, effusion, edema, pneumothorax or other clear  acute thoracic process. I also reviewed radiology interpretation and agree with findings described.  CTA head and neck, interpretation without evidence of hemorrhage, ischemia, edema, mass effect, dissection or large vessel occlusion.  I reviewed radiology interpretation and agree their findings in addition to notation of outpouching from the right ICA terminus favored to be an infundibulum versus tiny aneurysm.  No other acute process.  MR brain without contrast my interpretation without clear evidence of acute ischemia.  I reviewed radiology interpretation and agree with some nonspecific findings in the periventricular white matter and central pons poss related to central pontine melanosis versus other nonspecific cause.  BMP without any significant lecture light or metabolic derangements.  Serum glucose is 140.  On 7/10 when his last checked was 135.  Overall this is not suggestive of sodium related central pontine demyelination.  CBC without leukocytosis or acute anemia.  Troponin is nonelevated and overall clinical picture is not suggestive of ACS.  TSH is WNL.  Hepatic function panel is unremarkable.  UA has trace leukocyte esterase and rare bacteria but otherwise does not appear clinically infected and given absence of any urinary symptoms of a lower suspicion this is primary cause  of patient's symptoms.  VBG without evidence of hypercarbic respiratory failure.  Given abnormal brain MRI without contrast I consulted with on-call neurologist and spoke with Dr. Quinn Axe who reviewed MRI and agree that the pons seemed abnormal and recommended MRI with contrast.  This was obtained on shows a normal-appearing pons.  At this point I think patient does not require hospitalization or further emergency department evaluation and can follow-up with her PCP and neurology.  I discussed this with patient.  She is agreeable to plan.  Discharged in stable condition      FINAL CLINICAL IMPRESSION(S) / ED DIAGNOSES   Final diagnoses:  Word finding difficulty  Tobacco abuse  Intermittent confusion     Rx / DC Orders   ED Discharge Orders     None        Note:  This document was prepared using Dragon voice recognition software and may include unintentional dictation errors.   Lucrezia Starch, MD 07/16/22 [phone]

## 2022-07-17 LAB — URINE CULTURE: Culture: NO GROWTH

## 2022-07-18 ENCOUNTER — Inpatient Hospital Stay
Admission: EM | Admit: 2022-07-18 | Discharge: 2022-07-24 | DRG: 881 | Disposition: A | Discharge status: Home / Self Care | Payer: Medicare HMO | Attending: Internal Medicine | Admitting: Internal Medicine

## 2022-07-18 ENCOUNTER — Inpatient Hospital Stay (HOSPITAL_COMMUNITY)
Admit: 2022-07-18 | Discharge: 2022-07-18 | Disposition: A | Discharge status: Home / Self Care | Payer: Medicare HMO | Attending: Internal Medicine | Admitting: Internal Medicine

## 2022-07-18 ENCOUNTER — Emergency Department: Payer: Medicare HMO

## 2022-07-18 ENCOUNTER — Inpatient Hospital Stay: Payer: Medicare HMO

## 2022-07-18 ENCOUNTER — Other Ambulatory Visit: Payer: Self-pay

## 2022-07-18 DIAGNOSIS — J432 Centrilobular emphysema: Secondary | ICD-10-CM | POA: Diagnosis present

## 2022-07-18 DIAGNOSIS — K219 Gastro-esophageal reflux disease without esophagitis: Secondary | ICD-10-CM | POA: Diagnosis present

## 2022-07-18 DIAGNOSIS — M5136 Other intervertebral disc degeneration, lumbar region: Secondary | ICD-10-CM | POA: Diagnosis present

## 2022-07-18 DIAGNOSIS — F0390 Unspecified dementia without behavioral disturbance: Secondary | ICD-10-CM | POA: Diagnosis not present

## 2022-07-18 DIAGNOSIS — F99 Mental disorder, not otherwise specified: Secondary | ICD-10-CM

## 2022-07-18 DIAGNOSIS — R918 Other nonspecific abnormal finding of lung field: Secondary | ICD-10-CM | POA: Diagnosis present

## 2022-07-18 DIAGNOSIS — Z818 Family history of other mental and behavioral disorders: Secondary | ICD-10-CM | POA: Diagnosis not present

## 2022-07-18 DIAGNOSIS — J449 Chronic obstructive pulmonary disease, unspecified: Secondary | ICD-10-CM | POA: Diagnosis present

## 2022-07-18 DIAGNOSIS — R413 Other amnesia: Secondary | ICD-10-CM

## 2022-07-18 DIAGNOSIS — J438 Other emphysema: Secondary | ICD-10-CM | POA: Diagnosis present

## 2022-07-18 DIAGNOSIS — R4689 Other symptoms and signs involving appearance and behavior: Secondary | ICD-10-CM | POA: Diagnosis not present

## 2022-07-18 DIAGNOSIS — Z79899 Other long term (current) drug therapy: Secondary | ICD-10-CM

## 2022-07-18 DIAGNOSIS — Z720 Tobacco use: Secondary | ICD-10-CM | POA: Diagnosis not present

## 2022-07-18 DIAGNOSIS — R4182 Altered mental status, unspecified: Secondary | ICD-10-CM | POA: Diagnosis present

## 2022-07-18 DIAGNOSIS — M419 Scoliosis, unspecified: Secondary | ICD-10-CM | POA: Diagnosis present

## 2022-07-18 DIAGNOSIS — Z888 Allergy status to other drugs, medicaments and biological substances status: Secondary | ICD-10-CM | POA: Diagnosis not present

## 2022-07-18 DIAGNOSIS — D75839 Thrombocytosis, unspecified: Secondary | ICD-10-CM | POA: Diagnosis present

## 2022-07-18 DIAGNOSIS — D751 Secondary polycythemia: Secondary | ICD-10-CM | POA: Diagnosis present

## 2022-07-18 DIAGNOSIS — G4700 Insomnia, unspecified: Secondary | ICD-10-CM | POA: Diagnosis present

## 2022-07-18 DIAGNOSIS — F419 Anxiety disorder, unspecified: Secondary | ICD-10-CM | POA: Diagnosis present

## 2022-07-18 DIAGNOSIS — G9341 Metabolic encephalopathy: Secondary | ICD-10-CM | POA: Diagnosis present

## 2022-07-18 DIAGNOSIS — Z7951 Long term (current) use of inhaled steroids: Secondary | ICD-10-CM

## 2022-07-18 DIAGNOSIS — Z886 Allergy status to analgesic agent status: Secondary | ICD-10-CM | POA: Diagnosis not present

## 2022-07-18 DIAGNOSIS — F32A Depression, unspecified: Secondary | ICD-10-CM | POA: Diagnosis present

## 2022-07-18 DIAGNOSIS — D72829 Elevated white blood cell count, unspecified: Secondary | ICD-10-CM | POA: Diagnosis present

## 2022-07-18 DIAGNOSIS — F17210 Nicotine dependence, cigarettes, uncomplicated: Secondary | ICD-10-CM | POA: Diagnosis present

## 2022-07-18 DIAGNOSIS — Z87440 Personal history of urinary (tract) infections: Secondary | ICD-10-CM | POA: Diagnosis not present

## 2022-07-18 DIAGNOSIS — I10 Essential (primary) hypertension: Secondary | ICD-10-CM | POA: Diagnosis present

## 2022-07-18 DIAGNOSIS — G0481 Other encephalitis and encephalomyelitis: Secondary | ICD-10-CM | POA: Diagnosis not present

## 2022-07-18 LAB — URINALYSIS, ROUTINE W REFLEX MICROSCOPIC
Bilirubin Urine: NEGATIVE
Glucose, UA: NEGATIVE mg/dL
Hgb urine dipstick: NEGATIVE
Ketones, ur: NEGATIVE mg/dL
Leukocytes,Ua: NEGATIVE
Nitrite: NEGATIVE
Protein, ur: NEGATIVE mg/dL
Specific Gravity, Urine: 1.001 — ABNORMAL LOW (ref 1.005–1.030)
pH: 7 (ref 5.0–8.0)

## 2022-07-18 LAB — CSF CELL COUNT WITH DIFFERENTIAL
RBC Count, CSF: 0 /mm3 (ref 0–3)
Tube #: 3
WBC, CSF: 2 /mm3 (ref 0–5)

## 2022-07-18 LAB — CBC
HCT: 45.8 % (ref 36.0–46.0)
Hemoglobin: 14.9 g/dL (ref 12.0–15.0)
MCH: 31.4 pg (ref 26.0–34.0)
MCHC: 32.5 g/dL (ref 30.0–36.0)
MCV: 96.6 fL (ref 80.0–100.0)
Platelets: 472 10*3/uL — ABNORMAL HIGH (ref 150–400)
RBC: 4.74 MIL/uL (ref 3.87–5.11)
RDW: 12.9 % (ref 11.5–15.5)
WBC: 8.8 10*3/uL (ref 4.0–10.5)
nRBC: 0 % (ref 0.0–0.2)

## 2022-07-18 LAB — URINE DRUG SCREEN, QUALITATIVE (ARMC ONLY)
Amphetamines, Ur Screen: NOT DETECTED
Barbiturates, Ur Screen: NOT DETECTED
Benzodiazepine, Ur Scrn: NOT DETECTED
Cannabinoid 50 Ng, Ur ~~LOC~~: NOT DETECTED
Cocaine Metabolite,Ur ~~LOC~~: NOT DETECTED
MDMA (Ecstasy)Ur Screen: NOT DETECTED
Methadone Scn, Ur: NOT DETECTED
Opiate, Ur Screen: NOT DETECTED
Phencyclidine (PCP) Ur S: NOT DETECTED
Tricyclic, Ur Screen: NOT DETECTED

## 2022-07-18 LAB — COMPREHENSIVE METABOLIC PANEL
ALT: 14 U/L (ref 0–44)
AST: 19 U/L (ref 15–41)
Albumin: 4.3 g/dL (ref 3.5–5.0)
Alkaline Phosphatase: 57 U/L (ref 38–126)
Anion gap: 12 (ref 5–15)
BUN: <5 mg/dL — ABNORMAL LOW (ref 8–23)
CO2: 25 mmol/L (ref 22–32)
Calcium: 9.6 mg/dL (ref 8.9–10.3)
Chloride: 103 mmol/L (ref 98–111)
Creatinine, Ser: 0.49 mg/dL (ref 0.44–1.00)
GFR, Estimated: >60 mL/min (ref >60)
Glucose, Bld: 102 mg/dL — ABNORMAL HIGH (ref 70–99)
Potassium: 3.9 mmol/L (ref 3.5–5.1)
Sodium: 140 mmol/L (ref 135–145)
Total Bilirubin: 0.7 mg/dL (ref 0.3–1.2)
Total Protein: 7.6 g/dL (ref 6.5–8.1)

## 2022-07-18 LAB — TSH: TSH: 0.886 u[IU]/mL (ref 0.350–4.500)

## 2022-07-18 LAB — HIV ANTIBODY (ROUTINE TESTING W REFLEX): HIV Screen 4th Generation wRfx: NONREACTIVE

## 2022-07-18 LAB — PROTEIN, CSF: Total  Protein, CSF: 43 mg/dL (ref 15–45)

## 2022-07-18 LAB — GLUCOSE, CSF: Glucose, CSF: 62 mg/dL (ref 40–70)

## 2022-07-18 LAB — CBG MONITORING, ED: Glucose-Capillary: 107 mg/dL — ABNORMAL HIGH (ref 70–99)

## 2022-07-18 LAB — VITAMIN B12: Vitamin B-12: 344 pg/mL (ref 180–914)

## 2022-07-18 LAB — AMMONIA: Ammonia: 18 umol/L (ref 9–35)

## 2022-07-18 MED ORDER — ONDANSETRON HCL 4 MG PO TABS: 4.0000 mg | ORAL_TABLET | Freq: Four times a day (QID) | ORAL | Status: DC | PRN

## 2022-07-18 MED ORDER — ALBUTEROL SULFATE (2.5 MG/3ML) 0.083% IN NEBU: 2.5000 mg | INHALATION_SOLUTION | Freq: Four times a day (QID) | RESPIRATORY_TRACT | Status: DC | PRN

## 2022-07-18 MED ORDER — ACETAMINOPHEN 650 MG RE SUPP: 650.0000 mg | Freq: Four times a day (QID) | RECTAL | Status: DC | PRN

## 2022-07-18 MED ORDER — MOMETASONE FURO-FORMOTEROL FUM 200-5 MCG/ACT IN AERO
2.0000 | INHALATION_SPRAY | Freq: Two times a day (BID) | RESPIRATORY_TRACT | Status: DC
  Administered 2022-07-19 – 2022-07-24 (×11): 2 via RESPIRATORY_TRACT
  Filled 2022-07-18 (×2): qty 8.8

## 2022-07-18 MED ORDER — ONDANSETRON HCL 4 MG/2ML IJ SOLN
4.0000 mg | Freq: Once | INTRAMUSCULAR | Status: AC
  Administered 2022-07-18: 4 mg via INTRAVENOUS
  Filled 2022-07-18: qty 2

## 2022-07-18 MED ORDER — LORAZEPAM 2 MG/ML IJ SOLN
1.0000 mg | Freq: Once | INTRAMUSCULAR | Status: AC
  Administered 2022-07-18: 1 mg via INTRAVENOUS
  Filled 2022-07-18: qty 1

## 2022-07-18 MED ORDER — AMLODIPINE BESYLATE 5 MG PO TABS
2.5000 mg | ORAL_TABLET | Freq: Every day | ORAL | Status: DC
  Administered 2022-07-18 – 2022-07-24 (×7): 2.5 mg via ORAL
  Filled 2022-07-18 (×7): qty 1

## 2022-07-18 MED ORDER — LIDOCAINE HCL (PF) 1 % IJ SOLN
10.0000 mL | Freq: Once | INTRAMUSCULAR | Status: AC
  Administered 2022-07-18: 5 mL

## 2022-07-18 MED ORDER — ALBUTEROL SULFATE 108 (90 BASE) MCG/ACT IN AEPB
2.0000 | INHALATION_SPRAY | Freq: Four times a day (QID) | RESPIRATORY_TRACT | Status: DC | PRN
Start: 2022-07-18 — End: 2022-07-18

## 2022-07-18 MED ORDER — ACETAMINOPHEN 325 MG PO TABS: 650.0000 mg | ORAL_TABLET | Freq: Four times a day (QID) | ORAL | Status: DC | PRN

## 2022-07-18 MED ORDER — ONDANSETRON HCL 4 MG/2ML IJ SOLN: 4.0000 mg | Freq: Four times a day (QID) | INTRAMUSCULAR | Status: DC | PRN

## 2022-07-18 MED ORDER — MELATONIN 5 MG PO TABS: 5.0000 mg | ORAL_TABLET | Freq: Every day | ORAL | Status: DC

## 2022-07-18 MED ORDER — MELATONIN 5 MG PO TABS
5.0000 mg | ORAL_TABLET | Freq: Every day | ORAL | Status: DC
  Administered 2022-07-19 – 2022-07-23 (×5): 5 mg via ORAL
  Filled 2022-07-18 (×5): qty 1

## 2022-07-18 MED ORDER — PANTOPRAZOLE SODIUM 40 MG PO TBEC
40.0000 mg | DELAYED_RELEASE_TABLET | Freq: Every day | ORAL | Status: DC
  Administered 2022-07-18 – 2022-07-24 (×7): 40 mg via ORAL
  Filled 2022-07-18 (×7): qty 1

## 2022-07-18 MED ORDER — SODIUM CHLORIDE 0.9 % IV BOLUS
1000.0000 mL | Freq: Once | INTRAVENOUS | Status: AC
Start: 2022-07-18 — End: 2022-07-18
  Administered 2022-07-18: 1000 mL via INTRAVENOUS

## 2022-07-18 MED ORDER — ENOXAPARIN SODIUM 40 MG/0.4ML IJ SOSY
40.0000 mg | PREFILLED_SYRINGE | INTRAMUSCULAR | Status: DC
  Administered 2022-07-19 – 2022-07-24 (×6): 40 mg via SUBCUTANEOUS
  Filled 2022-07-18 (×6): qty 0.4

## 2022-07-18 MED ORDER — NICOTINE 21 MG/24HR TD PT24
21.0000 mg | MEDICATED_PATCH | Freq: Every day | TRANSDERMAL | Status: DC
Start: 2022-07-18 — End: 2022-07-24
  Administered 2022-07-18 – 2022-07-24 (×7): 21 mg via TRANSDERMAL
  Filled 2022-07-18 (×7): qty 1

## 2022-07-18 MED ORDER — ACETAMINOPHEN 325 MG PO TABS
650.0000 mg | ORAL_TABLET | Freq: Four times a day (QID) | ORAL | Status: DC | PRN
  Administered 2022-07-19 – 2022-07-23 (×6): 650 mg via ORAL
  Filled 2022-07-18 (×6): qty 2

## 2022-07-18 MED ORDER — ONDANSETRON HCL 4 MG/2ML IJ SOLN
4.0000 mg | Freq: Four times a day (QID) | INTRAMUSCULAR | Status: DC | PRN
  Administered 2022-07-19 – 2022-07-20 (×2): 4 mg via INTRAVENOUS
  Filled 2022-07-18 (×2): qty 2

## 2022-07-18 NOTE — ED Notes (Addendum)
Pt placed on 2L while she is asleep, o2 sats 88-89% on RA

## 2022-07-18 NOTE — H&P (Signed)
History and Physical    Patient: Yvonne Pitts FGH:[id number] DOB: 1956/09/09 DOA: 07/18/2022 DOS: the patient was seen and examined on 07/18/2022 PCP: Tracie Harrier, MD  Patient coming from: Home  Chief Complaint:  Chief Complaint  Patient presents with   Altered Mental Status   HPI: Yvonne Pitts is a 66 y.o. female with medical history significant of COPD, tobacco abuse, lumbar DDD who presents to the emergency department accompanied by husband due to 3-week onset of progressive worsening of patient's cognition as well as changes in behavior and personality, patient was very functional and communicative without any cognitive deficit at baseline.  Patient and family went to the beach about 3 weeks ago during which she was noted with behavioral changes including agitation, arguments, difficulty in sleeping, patient was taken to a hospital in [place] and she was diagnosed with UTI whereby antibiotics was switched from prior antibiotics (since she was diagnosed a week earlier with UTI). 2 days ago, patient endorsed transitory word finding difficulty while trying to speak to sister-in-law on the phone, symptoms resolved, but husband insisted on going to the ED for further evaluation and management.  In the emergency department, MRI was done and showed diffuse isolated T2 flair hyperintensity in the bilateral pons as well as minimally in the medulla.  There was no abnormal contrast enhancement.  Patient was reported to have been drinking significant amount of coffee daily and smoking has increased tremendously since onset of symptoms.  Patient was discharged home, symptoms continue to worsen due to increased agitation, she was then brought back to the ED for further evaluation by the husband.  ED Course:  In the emergency department, she was hemodynamically stable.  Work-up in the ED showed normal CBC except for thrombocytosis, BMP was normal, TSH 0.86, urine drug screen was negative,  CSF was normal. MRI brain without contrast done on 07/16/2022 showed: 1. Decreased T1 and increased T2 signal in the central pons without diffusion restriction, which is nonspecific and could represent the sequela of small vessel ischemic disease although few other T2 hyperintense foci are seen in the periventricular white matter. This could also be seen in the setting of central pontine myelinolysis. 2. No evidence of acute or subacute infarct. No additional acute intracranial process. MRI head with contrast done on 07/16/2022 No abnormal contrast enhancement within the pons. CT angiography of head and neck done on 07/16/2022 1.  No acute intracranial process. 2.  No intracranial large vessel occlusion or significant stenosis. 3.  No hemodynamically significant stenosis in the neck. 4. An outpouching from the right ICA terminus is favored to be an infundibulum at the origin of an otherwise not visualized right posterior communicating artery, which may be hypoplastic or stenosed, rather than a tiny aneurysm. Ativan was given, Zofran was given and patient was provided with IV hydration.  Neurologist was consulted and recommended further work-up  Review of Systems: Review of systems as noted in the HPI. All other systems reviewed and are negative.   Past Medical History:  Diagnosis Date   COPD (chronic obstructive pulmonary disease) ([place]) 11/03/2016   DDD (degenerative disc disease), lumbar    Polycythemia    Ruptured disc, cervical    Scoliosis    Past Surgical History:  Procedure Laterality Date   BREAST EXCISIONAL BIOPSY Right    years ago   DILATION AND CURETTAGE OF UTERUS     NECK SURGERY      Social History:  reports that she has been smoking  cigarettes. She has a 86.00 pack-year smoking history. She has never used smokeless tobacco. She reports that she does not currently use alcohol. She reports that she does not use drugs.   Allergies  Allergen Reactions   Prochlorperazine  Maleate     Other reaction(s): Unknown   Ibuprofen Rash    Family History  Problem Relation Age of Onset   Breast cancer Maternal Aunt 8   Bone cancer Paternal Aunt    Colon cancer Maternal Aunt      Prior to Admission medications   Medication Sig Start Date End Date Taking? Authorizing Provider  acetaminophen (TYLENOL) 500 MG tablet Take 500 mg by mouth daily.     [provider]  Albuterol Sulfate (PROAIR RESPICLICK) 937 (90 Base) MCG/ACT AEPB Inhale 2 puffs into the lungs every 6 (six) hours as needed. 12/31/18   Tyler Pita, MD  alendronate (FOSAMAX) 70 MG tablet Take 70 mg by mouth once a week. 09/03/21   [provider]  amLODipine (NORVASC) 2.5 MG tablet Take 2.5 mg by mouth daily. 02/06/20   [provider]  budesonide-formoterol (SYMBICORT) 160-4.5 MCG/ACT inhaler INHALE 2 INHALATIONS INTO THE LUNGS 2 TIMES DAILY 02/28/22   [provider]  calcium-vitamin D (OSCAL WITH D) 250-125 MG-UNIT tablet Take 1 tablet by mouth daily. Patient not taking: Reported on 03/11/2022    [provider]  citalopram (CELEXA) 20 MG tablet Take 20 mg by mouth daily. 10/23/20   [provider]  cyclobenzaprine (FLEXERIL) 5 MG tablet Take 5 mg by mouth 3 (three) times daily as needed for muscle spasms.    [provider]  Melatonin 5 MG TABS Take 1 tablet by mouth daily.     [provider]  omeprazole (PRILOSEC) 20 MG capsule Take 20 mg by mouth daily.     [provider]  QUEtiapine (SEROQUEL) 25 MG tablet Take 25 mg by mouth at bedtime. 07/07/22   [provider]  Respiratory Therapy Supplies (FLUTTER) DEVI Use as directed 11/30/18   Tyler Pita, MD    Physical Exam: BP (!) 95/38   Pulse 64   Temp 98 F (36.7 C) (Oral)   Resp 16   Ht '5\' 4"'$  (1.626 m)   Wt 53.5 kg   SpO2 93%   BMI 20.25 kg/m   General: 66 y.o. year-old female cachetic  and in no acute distress.  Alert and oriented x3. HEENT:  NCAT, EOMI Neck: Supple, trachea medial, dry mucous membrane Cardiovascular: Regular rate and rhythm with no rubs or gallops.  No thyromegaly or JVD noted.  No lower extremity edema. 2/4 pulses in all 4 extremities. Respiratory: Clear to auscultation with no wheezes or rales. Good inspiratory effort. Abdomen: Soft, nontender nondistended with normal bowel sounds x4 quadrants. Muskuloskeletal: No cyanosis, clubbing or edema noted bilaterally Neuro: CN II-XII intact, strength 5/5 x 4, sensation, reflexes intact Skin: No ulcerative lesions noted or rashes Psychiatry: Mood is appropriate for condition and setting          Labs on Admission:  Basic Metabolic Panel: Recent Labs  Lab 07/16/22 1518 07/18/22 0841  NA 140 140  K 3.7 3.9  CL 108 103  CO2 26 25  GLUCOSE 111* 102*  BUN 6* <5*  CREATININE 0.49 0.49  CALCIUM 9.0 9.6   Liver Function Tests: Recent Labs  Lab 07/16/22 1518 07/18/22 0841  AST 15 19  ALT 14 14  ALKPHOS 51 57  BILITOT 0.4 0.7  PROT 6.8  7.6  ALBUMIN 3.9 4.3   No results for input(s): "LIPASE", "AMYLASE" in the last 168 hours. Recent Labs  Lab 07/18/22 1722  AMMONIA 18   CBC: Recent Labs  Lab 07/16/22 1518 07/18/22 0841  WBC 9.3 8.8  HGB 13.9 14.9  HCT 43.1 45.8  MCV 97.5 96.6  PLT 472* 472*   Cardiac Enzymes: No results for input(s): "CKTOTAL", "CKMB", "CKMBINDEX", "TROPONINI" in the last 168 hours.  BNP (last 3 results) No results for input(s): "BNP" in the last 8760 hours.  ProBNP (last 3 results) No results for input(s): "PROBNP" in the last 8760 hours.  CBG: Recent Labs  Lab 07/18/22 0836  GLUCAP 107*    Radiological Exams on Admission: DG FL GUIDED LUMBAR PUNCTURE  Result Date: 07/18/2022 CLINICAL DATA:  Patient presented to the ED with altered mental status and there is concern for possible meningitis. Radiology request for diagnostic lumbar puncture. EXAM: DIAGNOSTIC LUMBAR PUNCTURE UNDER FLUOROSCOPIC GUIDANCE COMPARISON:   None Available. FLUOROSCOPY: Radiation Exposure Index (as provided by the fluoroscopic device): 13.8 mGy Kerma PROCEDURE: Informed consent was obtained from the patient's husband prior to the procedure, including potential complications of headache, allergy, and pain. With the patient prone, the lower back was prepped with Betadine. 1% Lidocaine was used for local anesthesia. Lumbar puncture was performed at the L3-L4 level using a 20 gauge spinal needle with return of clear CSF with an opening pressure of 29 cm water. 15 ml of CSF were obtained for laboratory studies. Closing pressure of 15 cm water. The patient tolerated the procedure well and there were no apparent complications. IMPRESSION: Technically successful L3-L4 lumbar puncture yielding 15 mL of clear CSF for laboratory studies. Perfromed by Soyla Dryer, NP and interpreted by Kathreen Devoid, MD Electronically Signed   By: Kathreen Devoid M.D.   On: 07/18/2022 16:04   MR BRAIN W CONTRAST  Result Date: 07/16/2022 CLINICAL DATA:  Pontine lesions EXAM: MRI HEAD WITH CONTRAST TECHNIQUE: Multiplanar, multiecho pulse sequences of the brain and surrounding structures were obtained with intravenous contrast. CONTRAST:  64m GADAVIST GADOBUTROL 1 MMOL/ML IV SOLN COMPARISON:  MRI brain without contrast 07/16/2022 FINDINGS: There is no abnormal contrast enhancement at the site of the pontine signal abnormality or anywhere else in the brain. IMPRESSION: No abnormal contrast enhancement within the pons. Electronically Signed   By: KUlyses JarredM.D.   On: 07/16/2022 22:54    EKG: I independently viewed the EKG done and my findings are as followed: Normal sinus rhythm at a rate of 79 bpm  Assessment/Plan Present on Admission:  COPD, moderate ([place]  Principal Problem:   Acute metabolic encephalopathy Active Problems:   COPD, moderate ([facility])   Thrombocytosis   GERD (gastroesophageal reflux disease)   Essential hypertension   Insomnia  Acute metabolic  encephalopathy possibly secondary to presumed encephalitis CSF protein, Gram stain, glucose, culture, cytology, oligoclonal bands, meningitis PCR panel, ENC[facility]. CSF cell count with differential was normal CT chest/abdomen/pelvis to rule out malignancy pending CT angiography head and neck pending B12, RPR, ammonia, ENS2 serum [facility]opathy [facility]  Routine EEG pending Psychiatric consult by ED physician pending  Thrombocytosis possibly reactive Platelets 472, continue to monitor  COPD (not in acute exacerbation) Continue albuterol and Dulera  GERD Continue Protonix  Essential hypertension Continue on Norvasc  Insomnia Continue melatonin  Tobacco abuse Patient was counseled on tobacco abuse cessation Continue nicotine patch  DVT prophylaxis: Lovenox  Code Status: Full code  Consults: Neurologist  Family Communication: Husband and son at bedside  Severity of Illness: The appropriate patient status for this patient is INPATIENT. Inpatient status is judged to be reasonable and necessary in order to provide the required intensity of service to ensure the patient's safety. The patient's presenting symptoms, physical exam findings, and initial radiographic and laboratory data in the context of their chronic comorbidities is felt to place them at high risk for further clinical deterioration. Furthermore, it is not anticipated that the patient will be medically stable for discharge from the hospital within 2 midnights of admission.   * I certify that at the point of admission it is my clinical judgment that the patient will require inpatient hospital care spanning beyond 2 midnights from the point of admission due to high intensity of service, high risk for further deterioration and high frequency of surveillance required.*  Author: Bernadette Hoit, DO 07/18/2022 9:53 PM  For on  call review [email or web address].

## 2022-07-18 NOTE — Progress Notes (Signed)
EEG complete - results pending 

## 2022-07-18 NOTE — Progress Notes (Signed)
PHARMACIST - PHYSICIAN COMMUNICATION  CONCERNING:  Enoxaparin (Lovenox) for DVT Prophylaxis Consult   RECOMMENDATION: Patient was prescribed enoxaprin '40mg'$  q24 hours for VTE prophylaxis.   Filed Weights   07/18/22 0825  Weight: 53.5 kg (118 lb)    Body mass index is 20.25 kg/m.  Estimated Creatinine Clearance: 59.2 mL/min (by C-G formula based on SCr of 0.49 mg/dL).  [name], PharmD, [facility] 07/18/2022 5:31 PM

## 2022-07-18 NOTE — ED Triage Notes (Signed)
Per pt husband, pt has been altered for the past 3 weeks since having a UTI, states was just here a  couple days ago for same and they ran a lot of test that were negative, husband states she is just repetitive with wording, states they have an autistic son at home and is having issues with them interacting . Pt is rambling in triage, no train of thought , word salad. States she is not eating properly. Denies SI/HI, states previous to 3 weeks ago she was normal, able to take care of herself , states this is not her and he needs help, states she has an appt with neuro in 2 weeks

## 2022-07-18 NOTE — ED Notes (Signed)
Pts family was visiting at bedside for 15 min, pt is resting in bed at this time, denies needs

## 2022-07-18 NOTE — Consult Note (Signed)
[facility] Consult   Reason for Consult: Consult for this 66 year old woman who presents to the emergency room with ongoing concerns especially on the part of the family that her behavior and mental status have changed Referring Physician: Quentin Cornwall Patient Identification: Yvonne Pitts MRN:  [id number] Principal Diagnosis: Altered mental status Diagnosis:  Principal Problem:   Altered mental status   Total Time spent with patient: 45 minutes  Subjective:   Yvonne Pitts is a 66 y.o. female patient admitted with "they were so worried".  HPI: Patient seen and chart reviewed.  Patient's husband was not present.  Some of the history taken from previous notes.  This 66 year old woman was brought to the hospital for the second time in a couple days because family has ongoing concerns that she is having a change to her personality and mental state.  They describe episodes of abnormal irritability or hyper verbality as well as some episodes of poor sleep.  Probably the most specific thing is a description of episodes in which she cannot speak for short periods of time.  Patient tells me that the symptom happens only intermittently and that when it occurs she will be trying to speak but will find herself with her mouth open and unable to get words out.  She says it is a little bit like a stutter except that she cannot speak at all.  It sounds like this will go on for many seconds but then resolved.  Patient does not believe she has had any change in her mood.  She does however say that she has been having some trouble sleeping for a few days.  Patient attributes a lot of this to her sense that she has a great deal of stress in her life.  She attributes most of that to anxiety on the part of her husband.  She tells me that she was extremely sick some time ago when she [place] the [place] and that they had to go to the hospital in [place] where she had a  urinary tract infection and was dehydrated.  She feels that ever since that time her husband has been overly anxious and that that is making her upset.  Patient denies any hallucinations.  Denies any feeling of depression.  Denies any suicidal or homicidal thought.  She is alert and oriented to place and situation.  I did not do any more formal cognitive testing at this time.  Past Psychiatric History: Patient reports a past history of anxiety.  Says that she saw a therapist in the past.  No previous hospitalization.  No suicide attempts.  She appears to be on citalopram which has been present for a couple of years possibly for anxiety or depression.  Low-dose Seroquel was also started a couple weeks ago possibly for sleep concerns.  Risk to Self:   Risk to Others:   Prior Inpatient Therapy:   Prior Outpatient Therapy:    Past Medical History:  Past Medical History:  Diagnosis Date   COPD (chronic obstructive pulmonary disease) ([place]) 11/03/2016   DDD (degenerative disc disease), lumbar    Polycythemia    Ruptured disc, cervical    Scoliosis     Past Surgical History:  Procedure Laterality Date   BREAST EXCISIONAL BIOPSY Right    years ago   DILATION AND CURETTAGE OF UTERUS     NECK SURGERY     Family History:  Family History  Problem Relation Age of  Onset   Breast cancer Maternal Aunt 29   Bone cancer Paternal Aunt    Colon cancer Maternal Aunt    Family Psychiatric  History: She reports having had a sister with depression Social History:  Social History   Substance and Sexual Activity  Alcohol Use Not Currently   Comment: seldom maybe 2-3 times a year     Social History   Substance and Sexual Activity  Drug Use No    Social History   Socioeconomic History   Marital status: Married    Spouse name: Not on file   Number of children: Not on file   Years of education: Not on file   Highest education level: Not on file  Occupational History   Not on file  Tobacco Use    Smoking status: Every Day    Packs/day: 2.00    Years: 43.00    Total pack years: 86.00    Types: Cigarettes   Smokeless tobacco: Never  Vaping Use   Vaping Use: Never used  Substance and Sexual Activity   Alcohol use: Not Currently    Comment: seldom maybe 2-3 times a year   Drug use: No   Sexual activity: Not Currently  Other Topics Concern   Not on file  Social History Narrative   Not on file   Social Determinants of Health   Financial Resource Strain: Not on file  Food Insecurity: Not on file  Transportation Needs: Not on file  Physical Activity: Not on file  Stress: Not on file  Social Connections: Not on file   Additional Social History:    Allergies:   Allergies  Allergen Reactions   Prochlorperazine Maleate     Other reaction(s): Unknown   Ibuprofen Rash    Labs:  Results for orders placed or performed during the hospital encounter of 07/18/22 (from the past 48 hour(s))  CBG monitoring, ED     Status: Abnormal   Collection Time: 07/18/22  8:36 AM  Result Value Ref Range   Glucose-Capillary 107 (H) 70 - 99 mg/dL    Comment: Glucose reference range applies only to samples taken after fasting for at least 8 hours.  Comprehensive metabolic panel     Status: Abnormal   Collection Time: 07/18/22  8:41 AM  Result Value Ref Range   Sodium 140 135 - 145 mmol/L   Potassium 3.9 3.5 - 5.1 mmol/L   Chloride 103 98 - 111 mmol/L   CO2 25 22 - 32 mmol/L   Glucose, Bld 102 (H) 70 - 99 mg/dL    Comment: Glucose reference range applies only to samples taken after fasting for at least 8 hours.   BUN <5 (L) 8 - 23 mg/dL   Creatinine, Ser 0.49 0.44 - 1.00 mg/dL   Calcium 9.6 8.9 - 10.3 mg/dL   Total Protein 7.6 6.5 - 8.1 g/dL   Albumin 4.3 3.5 - 5.0 g/dL   AST 19 15 - 41 U/L   ALT 14 0 - 44 U/L   Alkaline Phosphatase 57 38 - 126 U/L   Total Bilirubin 0.7 0.3 - 1.2 mg/dL   GFR, Estimated >60 >60 mL/min    Comment: (NOTE) Calculated using the CKD-EPI Creatinine  Equation (2021)    Anion gap 12 5 - 15    Comment: Performed at [facility], [address], [place], [place] [address]  CBC     Status: Abnormal   Collection Time: 07/18/22  8:41 AM  Result Value Ref Range  WBC 8.8 4.0 - 10.5 K/uL   RBC 4.74 3.87 - 5.11 MIL/uL   Hemoglobin 14.9 12.0 - 15.0 g/dL   HCT 45.8 36.0 - 46.0 %   MCV 96.6 80.0 - 100.0 fL   MCH 31.4 26.0 - 34.0 pg   MCHC 32.5 30.0 - 36.0 g/dL   RDW 12.9 11.5 - 15.5 %   Platelets 472 (H) 150 - 400 K/uL   nRBC 0.0 0.0 - 0.2 %    Comment: Performed at [facility], [address] [place], [place] [address]  TSH     Status: None   Collection Time: 07/18/22  8:41 AM  Result Value Ref Range   TSH 0.886 0.350 - 4.500 uIU/mL    Comment: Performed by a 3rd Generation assay with a functional sensitivity of <=0.01 uIU/mL. Performed at [facility], [address] [place], [place] [address]   Urinalysis, Routine w reflex microscopic     Status: Abnormal   Collection Time: 07/18/22  9:53 AM  Result Value Ref Range   Color, Urine STRAW (A) YELLOW   APPearance CLEAR (A) CLEAR   Specific Gravity, Urine 1.001 (L) 1.005 - 1.030   pH 7.0 5.0 - 8.0   Glucose, UA NEGATIVE NEGATIVE mg/dL   Hgb urine dipstick NEGATIVE NEGATIVE   Bilirubin Urine NEGATIVE NEGATIVE   Ketones, ur NEGATIVE NEGATIVE mg/dL   Protein, ur NEGATIVE NEGATIVE mg/dL   Nitrite NEGATIVE NEGATIVE   Leukocytes,Ua NEGATIVE NEGATIVE    Comment: Performed at [facility], [address], [place],  [address]  Urine Drug Screen, Qualitative     Status: None   Collection Time: 07/18/22  9:53 AM  Result Value Ref Range   Tricyclic, Ur Screen NONE DETECTED NONE DETECTED   Amphetamines, Ur Screen NONE DETECTED NONE DETECTED   MDMA (Ecstasy)Ur Screen NONE DETECTED NONE DETECTED   Cocaine Metabolite,Ur [place] NONE DETECTED NONE DETECTED   Opiate, Ur Screen NONE DETECTED NONE DETECTED   Phencyclidine (PCP) Ur S NONE DETECTED NONE  DETECTED   Cannabinoid 50 Ng, Ur [place] NONE DETECTED NONE DETECTED   Barbiturates, Ur Screen NONE DETECTED NONE DETECTED   Benzodiazepine, Ur Scrn NONE DETECTED NONE DETECTED   Methadone Scn, Ur NONE DETECTED NONE DETECTED    Comment: (NOTE) Tricyclics + metabolites, urine    Cutoff 1000 ng/mL Amphetamines + metabolites, urine  Cutoff 1000 ng/mL MDMA (Ecstasy), urine              Cutoff 500 ng/mL Cocaine Metabolite, urine          Cutoff 300 ng/mL Opiate + metabolites, urine        Cutoff 300 ng/mL Phencyclidine (PCP), urine         Cutoff 25 ng/mL Cannabinoid, urine                 Cutoff 50 ng/mL Barbiturates + metabolites, urine  Cutoff 200 ng/mL Benzodiazepine, urine              Cutoff 200 ng/mL Methadone, urine                   Cutoff 300 ng/mL  The urine drug screen provides only a preliminary, unconfirmed analytical test result and should not be used for non-medical purposes. Clinical consideration and professional judgment should be applied to any positive drug screen result due to possible interfering substances. A more specific alternate chemical method must be used in order to obtain a confirmed analytical result. Gas  chromatography / mass spectrometry (GC/MS) is the preferred confirm atory method. Performed at [facility], [address] [place], [place] [address]   Glucose, CSF     Status: None   Collection Time: 07/18/22  3:21 PM  Result Value Ref Range   Glucose, CSF 62 40 - 70 mg/dL    Comment: Performed at [facility], [address] [place], [place] [address]  Protein, CSF     Status: None   Collection Time: 07/18/22  3:21 PM  Result Value Ref Range   Total  Protein, CSF 43 15 - 45 mg/dL    Comment: Performed at [facility], [address] [place], [place] [address]  CSF cell count with differential     Status: None   Collection Time: 07/18/22  3:21 PM  Result Value Ref Range   Tube # 3    Color, CSF COLORLESS COLORLESS    Appearance, CSF CLEAR CLEAR   Supernatant CLEAR    RBC Count, CSF 0 0 - 3 /cu mm   WBC, CSF 2 0 - 5 /cu mm    Comment: Performed at [facility], [address] [place], [place] [address]  CSF culture w Gram Stain     Status: None (Preliminary result)   Collection Time: 07/18/22  3:21 PM   Specimen: PATH Cytology CSF; Cerebrospinal Fluid  Result Value Ref Range   Specimen Description CSF    Special Requests NONE    Gram Stain      NO ORGANISMS SEEN RED BLOOD CELLS PRESENT NO WBC SEEN Performed at [facility], [address], [place], [place] [address]    Culture PENDING    Report Status PENDING   Ammonia     Status: None   Collection Time: 07/18/22  5:22 PM  Result Value Ref Range   Ammonia 18 9 - 35 umol/L    Comment: Performed at [facility], [address] [place], [place] [address]    Current Facility-Administered Medications  Medication Dose Route Frequency Provider Last Rate Last Admin   [START ON 07/19/2022] enoxaparin (LOVENOX) injection 40 mg  40 mg Subcutaneous Q24H Belue, Nathan S, RPH       nicotine (NICODERM CQ - dosed in mg/24 hours) patch 21 mg  21 mg Transdermal Daily Arta Silence, MD   21 mg at 07/18/22 1537   Current Outpatient Medications  Medication Sig Dispense Refill   acetaminophen (TYLENOL) 500 MG tablet Take 500 mg by mouth daily.      Albuterol Sulfate (PROAIR RESPICLICK) 742 (90 Base) MCG/ACT AEPB Inhale 2 puffs into the lungs every 6 (six) hours as needed. 2 each 3   alendronate (FOSAMAX) 70 MG tablet Take 70 mg by mouth once a week.     amLODipine (NORVASC) 2.5 MG tablet Take 2.5 mg by mouth daily.     budesonide-formoterol (SYMBICORT) 160-4.5 MCG/ACT inhaler INHALE 2 INHALATIONS INTO THE LUNGS 2 TIMES DAILY     calcium-vitamin D (OSCAL WITH D) 250-125 MG-UNIT tablet Take 1 tablet by mouth daily. (Patient not taking: Reported on 03/11/2022)     citalopram (CELEXA) 20 MG tablet Take 20 mg by mouth daily.      cyclobenzaprine (FLEXERIL) 5 MG tablet Take 5 mg by mouth 3 (three) times daily as needed for muscle spasms.     Melatonin 5 MG TABS Take 1 tablet by mouth daily.      omeprazole (PRILOSEC) 20 MG capsule Take 20 mg by mouth  daily.      QUEtiapine (SEROQUEL) 25 MG tablet Take 25 mg by mouth at bedtime.     Respiratory Therapy Supplies (FLUTTER) DEVI Use as directed 1 each 0    Musculoskeletal: Strength & Muscle Tone: within normal limits Gait & Station: normal Patient leans: N/A            Psychiatric Specialty Exam:  Presentation  General Appearance: No data recorded Eye Contact:No data recorded Speech:No data recorded Speech Volume:No data recorded Handedness:No data recorded  Mood and Affect  Mood:No data recorded Affect:No data recorded  Thought Process  Thought Processes:No data recorded Descriptions of Associations:No data recorded Orientation:No data recorded Thought Content:No data recorded History of Schizophrenia/Schizoaffective disorder:No data recorded Duration of Psychotic Symptoms:No data recorded Hallucinations:No data recorded Ideas of Reference:No data recorded Suicidal Thoughts:No data recorded Homicidal Thoughts:No data recorded  Sensorium  Memory:No data recorded Judgment:No data recorded Insight:No data recorded  Executive Functions  Concentration:No data recorded Attention Span:No data recorded Recall:No data recorded Fund of Knowledge:No data recorded Language:No data recorded  Psychomotor Activity  Psychomotor Activity:No data recorded  Assets  Assets:No data recorded  Sleep  Sleep:No data recorded  Physical Exam: Physical Exam Vitals and nursing note reviewed.  Constitutional:      Appearance: Normal appearance.  HENT:     Head: Normocephalic and atraumatic.     Mouth/Throat:     Pharynx: Oropharynx is clear.  Eyes:     Pupils: Pupils are equal, round, and reactive to light.  Cardiovascular:     Rate and Rhythm:  Normal rate and regular rhythm.  Pulmonary:     Effort: Pulmonary effort is normal.     Breath sounds: Normal breath sounds.  Abdominal:     General: Abdomen is flat.     Palpations: Abdomen is soft.  Musculoskeletal:        General: Normal range of motion.  Skin:    General: Skin is warm and dry.  Neurological:     General: No focal deficit present.     Mental Status: She is alert. Mental status is at baseline.  Psychiatric:        Attention and Perception: Attention normal.        Mood and Affect: Mood normal.        Speech: Speech normal.        Behavior: Behavior is cooperative.        Thought Content: Thought content normal.    Review of Systems  Constitutional: Negative.   HENT: Negative.    Eyes: Negative.   Respiratory: Negative.    Cardiovascular: Negative.   Gastrointestinal: Negative.   Musculoskeletal: Negative.   Skin: Negative.   Neurological: Negative.   Psychiatric/Behavioral:  Negative for depression, hallucinations, memory loss, substance abuse and suicidal ideas. The patient is nervous/anxious and has insomnia.    Blood pressure 137/71, pulse 70, temperature 97.9 F (36.6 C), temperature source Oral, resp. rate 19, height '5\' 4"'$  (1.626 m), weight 53.5 kg, SpO2 92 %. Body mass index is 20.25 kg/m.  Treatment Plan Summary: Plan on evaluation today nothing really stood out as an obvious psychiatric condition.  Did not appear at all to be depressed nor did she appear to be psychotic.  During our conversation she did not have any of the episodes of speech interruption.  It is not impossible that the symptoms could be anxiety related but I certainly agree with the plan for a full and complete neurology workup particularly given the concern about  some abnormality on the MRI scan.  If it is the case that she has been on citalopram consistently I would suggest continuing this to avoid any complications from withdrawal.  Nothing about this sounds like a serotonin  syndrome.  I would probably hold off on the Seroquel at this point also to avoid any new complications.  Psychiatry will temporarily sign off but please request reconsult if we can be of further service.  Disposition: No evidence of imminent risk to self or others at present.    Alethia Berthold, MD 07/18/2022 6:59 PM

## 2022-07-18 NOTE — Procedures (Signed)
Technically successful L3/L4 lumbar puncture yielding 15 ml of clear CSF for laboratory studies. Please see full dictation under Imaging tab in Epic.   Soyla Dryer, [place] [phone] 07/18/2022, 3:30 PM

## 2022-07-18 NOTE — ED Provider Notes (Signed)
[facility] Provider Note    Event Date/Time   First MD Initiated Contact with Patient 07/18/22 [phone]     (approximate)   History   Altered Mental Status   HPI  Yvonne Pitts is a 66 y.o. female with history of COPD, DDD, and scoliosis who presents with altered mental status.  Per her husband, the patient has been behaving differently ever since she is treated for UTI and sinusitis several weeks ago.  She was seen in the ED 2 days ago for this and had an extensive work-up including CT angio and MRI with and without contrast, but the work-up was overall negative and the patient was able to go home.  However, the husband states that in the last 2 days she has gotten worse.  The patient is confused, speaking incoherently, and pleasant but hyperactive.  She denies any acute pain or other physical complaints.  The husband denies any known history of dementia.  He states that the patient may have had some mental health problems in the past but has never been diagnosed with schizophrenia or other psychotic disorder, and has never had any symptoms like what she is displaying currently.     Physical Exam   Triage Vital Signs: ED Triage Vitals  Enc Vitals Group     BP 07/18/22 0828 (!) 149/86     Pulse Rate 07/18/22 0828 94     Resp 07/18/22 0828 17     Temp 07/18/22 0828 98.1 F (36.7 C)     Temp Source 07/18/22 0828 Oral     SpO2 07/18/22 0828 96 %     Weight 07/18/22 0825 118 lb (53.5 kg)     Height 07/18/22 0825 '5\' 4"'$  (1.626 m)     Head Circumference --      Peak Flow --      Pain Score 07/18/22 0825 0     Pain Loc --      Pain Edu? --      Excl. in [place]? --     Most recent vital signs: Vitals:   07/18/22 1330 07/18/22 1400  BP: 140/79 (!) 150/62  Pulse: 84 72  Resp: 16 19  Temp:    SpO2: 100% 99%     General: Alert, oriented x2. CV:  Good peripheral perfusion.  Resp:  Normal effort.  Abd:  No distention.  Other:  Dry mucous membranes.   EOMI.  PERRLA.  Cranial nerves III through XII grossly intact except for mild right-sided facial droop.  Motor intact in all extremities.  No pronator drift.  Normal coordination.  Patient pleasant but somewhat hyperactive, disorganized in thought.   ED Results / Procedures / Treatments   Labs (all labs ordered are listed, but only abnormal results are displayed) Labs Reviewed  COMPREHENSIVE METABOLIC PANEL - Abnormal; Notable for the following components:      Result Value   Glucose, Bld 102 (*)    BUN <5 (*)    All other components within normal limits  CBC - Abnormal; Notable for the following components:   Platelets 472 (*)    All other components within normal limits  URINALYSIS, ROUTINE W REFLEX MICROSCOPIC - Abnormal; Notable for the following components:   Color, Urine STRAW (*)    APPearance CLEAR (*)    Specific Gravity, Urine 1.001 (*)    All other components within normal limits  CBG MONITORING, ED - Abnormal; Notable for the following components:   Glucose-Capillary 107 (*)  All other components within normal limits  TSH  URINE DRUG SCREEN, QUALITATIVE ([facility] ONLY)  MENINGITIS/ENCEPHALITIS PANEL (CSF)  MISC [facility] TEST (SEND OUT)     EKG  ED ECG REPORT I, Arta Silence, the attending physician, personally viewed and interpreted this ECG.  Date: 07/18/2022 EKG Time: 0839 Rate: 79 Rhythm: normal sinus rhythm QRS Axis: normal Intervals: normal ST/T Wave abnormalities: normal Narrative Interpretation: no evidence of acute ischemia    RADIOLOGY     PROCEDURES:  Critical Care performed: No  Procedures   MEDICATIONS ORDERED IN ED: Medications  nicotine (NICODERM CQ - dosed in mg/24 hours) patch 21 mg (21 mg Transdermal Patient Refused/Not Given 07/18/22 1113)  sodium chloride 0.9 % bolus 1,000 mL (0 mLs Intravenous Stopped 07/18/22 1113)  ondansetron (ZOFRAN) injection 4 mg (4 mg Intravenous Given 07/18/22 0949)  LORazepam (ATIVAN) injection  1 mg (1 mg Intravenous Given 07/18/22 1102)  LORazepam (ATIVAN) injection 1 mg (1 mg Intravenous Given 07/18/22 1140)     IMPRESSION / MDM / ASSESSMENT AND PLAN / ED COURSE  I reviewed the triage vital signs and the nursing notes.  66 year old female with PMH as noted above presents with persistent and worsening altered mental status over the last few weeks.  I reviewed the past medical records.  The patient was seen in the ED for this on 7/19 but it does not appear that she was as altered at that time.  She had CTA as well as MRI of the brain with and without contrast.  The MRI without contrast showed some pontine abnormalities that were not seen on the MRI with contrast.  Neurology was consulted and recommended outpatient work-up.  Per the outpatient notes, the patient was treated earlier this month for sinusitis and UTI with Omnicef and Bactrim.  She was also started on Seroquel.  Differential diagnosis includes, but is not limited to, acute infection, AKI, metabolic disturbance, medication side effect, subacute CNS cause, new onset of dementia, or possible primary psychiatric cause although this is less likely given the patient's age and lack of history of similar episodes.  Patient's presentation is most consistent with acute presentation with potential threat to life or bodily function.  Given that the patient has a normal neurologic exam and her symptoms have gradually worsened but not acutely changed since 2 days ago, there is no indication for repeat imaging today.  We will obtain lab work-up, urinalysis, give a fluid bolus, and reassess.  I anticipate the patient will need admission for neuro and possible psychiatric work-up.  The patient is on the cardiac monitor to evaluate for evidence of arrhythmia and/or significant heart rate changes.  ----------------------------------------- 11:21 AM on 07/18/2022 -----------------------------------------  Lab work-up is unremarkable.   Electrolytes are normal.  TSH is normal.  There is no leukocytosis.  Urinalysis and UDS are negative.  I consulted Dr. Quinn Axe from neurology to evaluate the patient, as well as a psychiatry consult.  ----------------------------------------- 2:44 PM on 07/18/2022 -----------------------------------------  Dr. Quinn Axe has evaluated the patient.  She is concerned for possible rhombencephalitis or other potential CNS cause and recommends an LP by interventional radiology and admission for further work-up and monitoring.  I then consulted Dr. Julieta Gutting of also from the hospitalist service; based on our discussion he agrees to admit the patient.   FINAL CLINICAL IMPRESSION(S) / ED DIAGNOSES   Final diagnoses:  Altered mental status, unspecified altered mental status type     Rx / DC Orders   ED Discharge Orders  None        Note:  This document was prepared using Dragon voice recognition software and may include unintentional dictation errors.    Arta Silence, MD 07/18/22 [phone]

## 2022-07-18 NOTE — ED Notes (Signed)
Pt given meal tray and coke at this time

## 2022-07-18 NOTE — Consult Note (Addendum)
NEUROLOGY CONSULTATION NOTE   Date of service: July 18, 2022 Patient Name: Yvonne Pitts MRN:  [id number] DOB:  1956-08-15 Reason for consult: rapidly progressive cognitive decline Requesting physician: Dr. Arta Silence _ _ _   _ __   _ __ _ _  __ __   _ __   __ _  History of Present Illness   Is a 66 year old woman with an extensive history of tobacco abuse, COPD, extensive lumbar degenerative disc disease who is brought in by husband for 3 weeks of rapid progressive cognitive decline as well as behavioral and personality change.  Patient was in her usual state of health until approximately 3 weeks ago.  At baseline she has no cognitive issues nor does she have any neurologic deficits.  They went to the beach approximately 3 weeks ago and she began acting strangely, pressured speech, arguing with her adult son who is autistic.  Has been reports that she has had a significant change in personality and has been arguing, agitated, unable to sleep.  They took her to the hospital in [place] where she was diagnosed with a UTI.  She had been started on antibiotics as an outpatient for UTI the week before however it was still present and they switched her antibiotics.  Her cognitive deficits and personality change have only progressed since that time however.  UA is unremarkable today.  She actually presented to the [facility] ED approximately 2 days ago at which time she had an MRI brain which showed diffuse isolated T2 flair hyperintensity in the bilateral pons as well as minimally in the medulla.  There was no abnormal contrast-enhancement.  CNS imaging was personally reviewed.  Patient has no known history of malignancy however she does have a very extensive history of tobacco abuse and has COPD.  Patient was discharged from the ED at that time however husband brought her back today due to progressive agitation at home.   ROS   Per HPI: all other systems reviewed and are negative  Past  History   I have reviewed the following:  Past Medical History:  Diagnosis Date   COPD (chronic obstructive pulmonary disease) ([place]) 11/03/2016   DDD (degenerative disc disease), lumbar    Polycythemia    Ruptured disc, cervical    Scoliosis    Past Surgical History:  Procedure Laterality Date   BREAST EXCISIONAL BIOPSY Right    years ago   DILATION AND CURETTAGE OF UTERUS     NECK SURGERY     Family History  Problem Relation Age of Onset   Breast cancer Maternal Aunt 64   Bone cancer Paternal Aunt    Colon cancer Maternal Aunt    Social History   Socioeconomic History   Marital status: Married    Spouse name: Not on file   Number of children: Not on file   Years of education: Not on file   Highest education level: Not on file  Occupational History   Not on file  Tobacco Use   Smoking status: Every Day    Packs/day: 2.00    Years: 43.00    Total pack years: 86.00    Types: Cigarettes   Smokeless tobacco: Never  Vaping Use   Vaping Use: Never used  Substance and Sexual Activity   Alcohol use: Not Currently    Comment: seldom maybe 2-3 times a year   Drug use: No   Sexual activity: Not Currently  Other Topics Concern   Not on  file  Social History Narrative   Not on file   Social Determinants of Health   Financial Resource Strain: Not on file  Food Insecurity: Not on file  Transportation Needs: Not on file  Physical Activity: Not on file  Stress: Not on file  Social Connections: Not on file   Allergies  Allergen Reactions   Prochlorperazine Maleate     Other reaction(s): Unknown   Ibuprofen Rash    Medications   (Not in a hospital admission)     Current Facility-Administered Medications:    nicotine (NICODERM CQ - dosed in mg/24 hours) patch 21 mg, 21 mg, Transdermal, Daily, Arta Silence, MD  Current Outpatient Medications:    acetaminophen (TYLENOL) 500 MG tablet, Take 500 mg by mouth daily. , Disp: , Rfl:    Albuterol Sulfate  (PROAIR RESPICLICK) 818 (90 Base) MCG/ACT AEPB, Inhale 2 puffs into the lungs every 6 (six) hours as needed., Disp: 2 each, Rfl: 3   alendronate (FOSAMAX) 70 MG tablet, Take 70 mg by mouth once a week., Disp: , Rfl:    amLODipine (NORVASC) 2.5 MG tablet, Take 2.5 mg by mouth daily., Disp: , Rfl:    budesonide-formoterol (SYMBICORT) 160-4.5 MCG/ACT inhaler, INHALE 2 INHALATIONS INTO THE LUNGS 2 TIMES DAILY, Disp: , Rfl:    calcium-vitamin D (OSCAL WITH D) 250-125 MG-UNIT tablet, Take 1 tablet by mouth daily. (Patient not taking: Reported on 03/11/2022), Disp: , Rfl:    citalopram (CELEXA) 20 MG tablet, Take 20 mg by mouth daily., Disp: , Rfl:    cyclobenzaprine (FLEXERIL) 5 MG tablet, Take 5 mg by mouth 3 (three) times daily as needed for muscle spasms., Disp: , Rfl:    Melatonin 5 MG TABS, Take 1 tablet by mouth daily. , Disp: , Rfl:    omeprazole (PRILOSEC) 20 MG capsule, Take 20 mg by mouth daily. , Disp: , Rfl:    QUEtiapine (SEROQUEL) 25 MG tablet, Take 25 mg by mouth at bedtime., Disp: , Rfl:    Respiratory Therapy Supplies (FLUTTER) DEVI, Use as directed, Disp: 1 each, Rfl: 0  Vitals   Vitals:   07/18/22 1230 07/18/22 1300 07/18/22 1330 07/18/22 1400  BP: (!) 129/57 122/64 140/79 (!) 150/62  Pulse: 67 67 84 72  Resp: '16 13 16 19  '$ Temp:  97.9 F (36.6 C)    TempSrc:  Oral    SpO2: 96% 98% 100% 99%  Weight:      Height:         Body mass index is 20.25 kg/m.  Physical Exam   Physical Exam Gen: A&O x4, NAD HEENT: Atraumatic, normocephalic;mucous membranes moist; oropharynx clear, tongue without atrophy or fasciculations. Neck: Supple, trachea midline. Resp: CTAB, no w/r/r CV: RRR, no m/g/r; nml S1 and S2. 2+ symmetric peripheral pulses. Abd: soft/NT/ND; nabs x 4 quad Extrem: Nml bulk; no cyanosis, clubbing, or edema.  Neuro: *MS: A&O x4. Follows simple commands but not multi-step. Able to calculate change. Poor attention. Registration 3/3. Delayed recall 0/3 (does state 3  objects, but not the 3 examiner asked her to remember) *Speech: fluid, nondysarthric, able to name and repeat *CN:    I: Deferred   II,III: PERRLA, VFF by confrontation, optic discs unable to be visualized 2/2 pupillary constriction   III,IV,VI: EOMI w/o nystagmus, no ptosis   V: Sensation intact from V1 to V3 to LT   VII: Eyelid closure was full.  Smile symmetric.   VIII: Hearing intact to voice   IX,X: Voice normal, palate  elevates symmetrically    XI: SCM/trap 5/5 bilat   XII: Tongue protrudes midline, no atrophy or fasciculations   *Motor:   Normal bulk.  No tremor, rigidity or bradykinesia. No pronator drift.    Strength: Dlt Bic Tri WrE WrF FgS Gr HF KnF KnE PlF DoF    Left '5 5 5 5 5 5 5 5 5 5 5 5    '$ Right '5 5 5 5 5 5 5 5 5 5 5 5    '$ *Sensory: Intact to light touch, pinprick, temperature vibration throughout. Symmetric. Propioception intact bilat.  No double-simultaneous extinction.  *Coordination:  Dysmetria on bilateral FNF without frank ataxia *Reflexes:  2+ brisk and symmetric throughout without clonus; toes w/d bilat *Gait: deferred   Labs   CBC:  Recent Labs  Lab 07/16/22 1518 07/18/22 0841  WBC 9.3 8.8  HGB 13.9 14.9  HCT 43.1 45.8  MCV 97.5 96.6  PLT 472* 472*    Basic Metabolic Panel:  Lab Results  Component Value Date   NA 140 07/18/2022   K 3.9 07/18/2022   CO2 25 07/18/2022   GLUCOSE 102 (H) 07/18/2022   BUN <5 (L) 07/18/2022   CREATININE 0.49 07/18/2022   CALCIUM 9.6 07/18/2022   GFRNONAA >60 07/18/2022   GFRAA >60 08/17/2019   Lipid Panel: No results found for: "LDLCALC" HgbA1c: No results found for: "HGBA1C" Urine Drug Screen:     Component Value Date/Time   LABOPIA NONE DETECTED 07/18/2022 0953   COCAINSCRNUR NONE DETECTED 07/18/2022 0953   LABBENZ NONE DETECTED 07/18/2022 0953   AMPHETMU NONE DETECTED 07/18/2022 [place] 07/18/2022 0953   LABBARB NONE DETECTED 07/18/2022 0953    Alcohol Level No results found for:  "ETH"   Impression   This is a 66 yo woman admitted with rapidly progressive cognitive decline with extensive psychiatric overlay over the past 3 weeks. Prior to that she was cognitively normal without any neurologic deficits. The rapidity of her progression is concerning and she will require admission for expedited workup. CMP and UA are wholly unremarkable. CBC notable for only a mild thrombocytosis, likely reactive. TSH WNL. Will order RPR, ammonia, and B12 as well.   The MRI brain shows abnormal T2/FLAIR hyperintensity diffusely (symmetric) in the pons and part of the medulla. There is no abnormal contrast enhancement. This in combination with her rapidly progressive cognitive decline and acute psychiatric disturbance can be seen in the setting of rhombencephalitis. Etiologies can be paraneoplastic (particularly given patient's extensive hx tobacco abuse) or infectious. I will order IR guided LP today to evaluate for infectious, malignant, or autoimmune etiologies for her sx. Given her extensive smoking hx and concern for potential paraneoplastic syndrome will plan for CT c/a/p w contrast malignancy screen tomorrow after she undergoes LP. She will also need CTA H&N to look for any e/o vasculitis but this is least urgent and can be deferred until other studies are completed.  Radiographic ddx for MRI abnormalities include osmotic demyelination syndrome (no history c/w this) and chronic small vessel ischemic disease (unlikely in this patient with very little burden CSVID overall in bilateral cerebral hemispheres).  Once infectious etiologies are ruled out will consider empiric high-dose steroids for potential autoimmune/paraneoplastic etiology. Antibody testing panels will take approximately 3 weeks to result and treatment should not be delayed waiting for these.  Neuro-Behcets can present with similar parenchymal lesions but husband is unaware of patient having any history of oral or genital ulcers.  There is no  pathognomonic test for Behcets.   Recommendations   - IR guided LP today with cell count w/ diff, protein, glucose, gram stain, culture, VDRL, cytology, oligoclonal bands, meningitis PCR panel, ENC2 autoimmune encephalopathy panel send out to [facility]. Patient is unable to undergo bedside LP 2/2 extensive hx lumbar DDD. - While atypical infection should be ruled out prior to steroid administration, overall suspicion for infectious etiology is low and therefore empiric meningitis coverage will therefore not be started. Meningitis PCR panel will be reviewed and should result within one hour. - CT c/a/p malignancy screen tomorrow - CTA H&N evaluate for e/o vasculitis (least urgent test) - B12, RPR, ammonia, ENS2 serum encephalopathy panel send out to [facility] clinic - rEEG - Agree with psychiatry consult to r/o primary psychiatric disturbance and for assistance with agitation - After infectious etiologies are ruled out will consider high-dose steroids empiric tx for autoimmune encephalitis  Will continue to follow. ______________________________________________________________________   Thank you for the opportunity to take part in the care of this patient. If you have any further questions, please contact the neurology consultation attending.  Signed,  Su Monks, MD [facility] [phone]  If 7pm- 7am, please page neurology on call as listed in [place].

## 2022-07-19 ENCOUNTER — Encounter: Payer: Self-pay | Admitting: Internal Medicine

## 2022-07-19 ENCOUNTER — Other Ambulatory Visit: Payer: Self-pay

## 2022-07-19 ENCOUNTER — Inpatient Hospital Stay: Payer: Medicare HMO

## 2022-07-19 DIAGNOSIS — G0481 Other encephalitis and encephalomyelitis: Secondary | ICD-10-CM

## 2022-07-19 DIAGNOSIS — R4689 Other symptoms and signs involving appearance and behavior: Secondary | ICD-10-CM | POA: Diagnosis not present

## 2022-07-19 DIAGNOSIS — F32A Depression, unspecified: Secondary | ICD-10-CM

## 2022-07-19 DIAGNOSIS — Z720 Tobacco use: Secondary | ICD-10-CM

## 2022-07-19 LAB — COMPREHENSIVE METABOLIC PANEL
ALT: 12 U/L (ref 0–44)
AST: 18 U/L (ref 15–41)
Albumin: 3.4 g/dL — ABNORMAL LOW (ref 3.5–5.0)
Alkaline Phosphatase: 47 U/L (ref 38–126)
Anion gap: 5 (ref 5–15)
BUN: <5 mg/dL — ABNORMAL LOW (ref 8–23)
CO2: 24 mmol/L (ref 22–32)
Calcium: 8.6 mg/dL — ABNORMAL LOW (ref 8.9–10.3)
Chloride: 110 mmol/L (ref 98–111)
Creatinine, Ser: 0.49 mg/dL (ref 0.44–1.00)
GFR, Estimated: >60 mL/min (ref >60)
Glucose, Bld: 84 mg/dL (ref 70–99)
Potassium: 3.6 mmol/L (ref 3.5–5.1)
Sodium: 139 mmol/L (ref 135–145)
Total Bilirubin: 0.5 mg/dL (ref 0.3–1.2)
Total Protein: 5.9 g/dL — ABNORMAL LOW (ref 6.5–8.1)

## 2022-07-19 LAB — CBC
HCT: 42.9 % (ref 36.0–46.0)
Hemoglobin: 13.9 g/dL (ref 12.0–15.0)
MCH: 31.8 pg (ref 26.0–34.0)
MCHC: 32.4 g/dL (ref 30.0–36.0)
MCV: 98.2 fL (ref 80.0–100.0)
Platelets: 390 10*3/uL (ref 150–400)
RBC: 4.37 MIL/uL (ref 3.87–5.11)
RDW: 12.8 % (ref 11.5–15.5)
WBC: 7.4 10*3/uL (ref 4.0–10.5)
nRBC: 0 % (ref 0.0–0.2)

## 2022-07-19 LAB — MAGNESIUM: Magnesium: 2.2 mg/dL (ref 1.7–2.4)

## 2022-07-19 LAB — RPR: RPR Ser Ql: NONREACTIVE

## 2022-07-19 LAB — APTT: aPTT: 36 seconds (ref 24–36)

## 2022-07-19 LAB — PHOSPHORUS: Phosphorus: 3.5 mg/dL (ref 2.5–4.6)

## 2022-07-19 MED ORDER — OXYMETAZOLINE HCL 0.05 % NA SOLN
1.0000 | Freq: Two times a day (BID) | NASAL | Status: AC
  Administered 2022-07-19 – 2022-07-22 (×6): 1 via NASAL
  Filled 2022-07-19 (×2): qty 15

## 2022-07-19 MED ORDER — IOHEXOL 350 MG/ML SOLN
80.0000 mL | Freq: Once | INTRAVENOUS | Status: AC | PRN
  Administered 2022-07-19: 80 mL via INTRAVENOUS

## 2022-07-19 MED ORDER — ORAL CARE MOUTH RINSE: 15.0000 mL | OROMUCOSAL | Status: DC | PRN

## 2022-07-19 MED ORDER — SODIUM CHLORIDE 0.9 % IV SOLN
1000.0000 mg | INTRAVENOUS | Status: AC
  Administered 2022-07-20 – 2022-07-24 (×5): 1000 mg via INTRAVENOUS
  Filled 2022-07-19 (×5): qty 16

## 2022-07-19 MED ORDER — CITALOPRAM HYDROBROMIDE 20 MG PO TABS
20.0000 mg | ORAL_TABLET | Freq: Every day | ORAL | Status: DC
  Administered 2022-07-19 – 2022-07-24 (×6): 20 mg via ORAL
  Filled 2022-07-19 (×6): qty 1

## 2022-07-19 NOTE — Assessment & Plan Note (Addendum)
Stable without signs of exacerbation. - Continue albuterol and [facility]

## 2022-07-19 NOTE — Hospital Course (Addendum)
Yvonne Pitts is a 66 yo female with PMH depression, COPD, ongoing tobacco use, DDD, polycythemia who presented initially to the ER on 07/16/2022 with odd behavior. At that time she had reported that she had been on 2 courses of antibiotics outpatient for sinusitis and UTI.  Most recent course of Bactrim was completed approximately 10 days prior but they do not recall the first antibiotic.  She was in [place] when she was prescribed these.  Family states that her behavior changes began shortly after all of this.   Other medication changes include addition of Seroquel at night to help with sleep and discontinuation of Celexa without taper.  This change took place on 07/07/2022 however her behavior changes were already taking place prior to this medication change. She underwent work-up in the ER on 07/16/2022 which included CXR, CT angio head/neck, MRI brain without contrast followed by MRI brain with contrast. UDS was notable for TCA which she is not prescribed however can be seen with false positives when taking seroquel and/or flexeril (both of which were filled recently).  VBG unremarkable.  TSH normal.  UA negative for infection. Repeat UDS on 07/18/2022 was negative for presence of TCA at that time.  She was sent home from the ER on 07/16/2022 due to negative work-up and no obvious etiology for her behavior change. She was then brought back to the hospital on 07/18/2022 due to ongoing abnormal behavior.  At that point she underwent evaluation by neurology and psychiatry.  She also underwent a lumbar puncture.  After evaluation by neurology she is being trialed on high dose steroids due to concern for possible autoimmune encephalitis while further labs return.

## 2022-07-19 NOTE — Progress Notes (Signed)
Progress Note    Yvonne Pitts   GOT:[id number]  DOB: Apr 06, 1956  DOA: 07/18/2022     1 PCP: Tracie Harrier, MD  Initial CC: Abnormal behavior  Hospital Course: [name] is a 66 yo female with PMH depression, COPD, ongoing tobacco use, DDD, polycythemia who presented initially to the ER on 07/16/2022 with odd behavior. At that time she had reported that she had been on 2 courses of antibiotics outpatient for sinusitis and UTI.  Most recent course of Bactrim was completed approximately 10 days prior but they do not recall the first antibiotic.  She was in [place] when she was prescribed these.  Family states that her behavior changes began shortly after all of this.   Other medication changes include addition of Seroquel at night to help with sleep and discontinuation of Celexa without taper.  This change took place on 07/07/2022 however her behavior changes were already taking place prior to this medication change. She underwent work-up in the ER on 07/16/2022 which included CXR, CT angio head/neck, MRI brain without contrast followed by MRI brain with contrast. There were no acute findings on any of the imaging studies including stroke.  UDS was notable for TCA which she is not prescribed however can be seen with false positives when taking seroquel and/or flexeril (both of which were filled recently).  VBG unremarkable.  TSH normal.  UA negative for infection. Repeat UDS on 07/18/2022 was negative for presence of TCA at that time.  She was sent home from the ER on 07/16/2022 due to negative work-up and no obvious etiology for her behavior change. She was then brought back to the hospital on 07/18/2022 due to ongoing abnormal behavior.  At that point she underwent evaluation by neurology and psychiatry.  She also underwent a lumbar puncture.  Interval History:  Met with patient in the ER this morning.  She was accompanied with husband, daughter, son (autistic).  Multiple  conversations happened frequently and they had to interrupt one another several times.  Patient did appear to be somewhat stressed with everyone present and trying to also talk.  However, when she was trying to elaborate, she did not have a clear thought process and could not fully answer my questions effectively/adequately.  Assessment and Plan: * Abnormal behavior - very unclear etiology at this time if any and odd presentation - main description by family is that she has been acting very abnormal, increased irritability, poor sleep, stuttering at times, difficulty with concentration, difficulty forming thought processes - large workup has been commenced; so far negative thus far but still pending tests - follow up M/E panel from CSF and other pending tests - she has been evaluated by psychiatry as well with "no obvious psychiatric conditions".  She was not felt to be psychotic or depressed per psychiatry assessment.  Depression - Patient has historically been on Celexa.  She is unable to definitively state how consistent she is with taking it; her daughter thinks that she misses doses here and there as sometimes she takes it "when needed" -On 07/07/2022, she was discontinued from [place] and started on Seroquel to help with sleep at night but family has not noticed any significant benefit at this time - Discussed with patient and family in the ER.  For now to avoid any significant SSRI withdrawal, agree on placing back on Celexa at this time and encouraging her to take it consistently.  Discontinue Seroquel  Tobacco abuse - Patient endorses smoking approximately 2  PPD.  Recently due to increased level of stress she has been smoking up to 3 PPD - Patient strongly encouraged to cut back and try to quit given underlying emphysematous lung changes; she also has several small stable nodules noted on lung cancer screening CT - Continue nicotine patch  Thrombocytosis-resolved as of 07/19/2022 -  Presumed reactive - Platelets have normalized today - Continue intermittent monitoring  Essential hypertension - Continue amlodipine  GERD (gastroesophageal reflux disease) - Continue Protonix  COPD, moderate ([place]) - No signs or symptoms of exacerbation - Continue albuterol and Dulera   Old records reviewed in assessment of this patient  Antimicrobials:   DVT prophylaxis:  enoxaparin (LOVENOX) injection 40 mg Start: 07/19/22 0800 SCDs Start: 07/18/22 1910 SCDs Start: 07/18/22 1910   Code Status:   Code Status: Full Code  Mobility Assessment (last 72 hours)     Mobility Assessment   No documentation.           Disposition Plan:  Home 1-2 days Status is: Inpt  Objective: Blood pressure (!) 122/55, pulse 62, temperature 98.6 F (37 C), temperature source Oral, resp. rate 20, height $RemoveBe'5\' 4"'TJfIsVvwn$  (1.626 m), weight 53.5 kg, SpO2 92 %.  Examination:  Physical Exam Constitutional:      General: She is not in acute distress.    Appearance: She is not ill-appearing.     Comments: Adult woman laying in bed in no perceived distress with obvious erratic behavior and inappropriate laughing at times  HENT:     Head: Normocephalic and atraumatic.     Mouth/Throat:     Mouth: Mucous membranes are moist.  Eyes:     Extraocular Movements: Extraocular movements intact.  Cardiovascular:     Rate and Rhythm: Normal rate and regular rhythm.  Pulmonary:     Effort: Pulmonary effort is normal.     Breath sounds: Normal breath sounds.  Abdominal:     General: Bowel sounds are normal. There is no distension.     Palpations: Abdomen is soft.     Tenderness: There is no abdominal tenderness.  Musculoskeletal:        General: Normal range of motion.     Cervical back: Normal range of motion and neck supple. No rigidity.  Skin:    General: Skin is warm and dry.  Neurological:     General: No focal deficit present.     Mental Status: She is oriented to person, place, and time.   Psychiatric:     Comments: Poor thought process, almost tangential (could have been circumferential at times but she forgot the question most of the time). Odd affect. Inappropriate laughter      Consultants:  Neurology Psychiatry   Procedures:  LP, 07/18/22  Data Reviewed: Results for orders placed or performed during the hospital encounter of 07/18/22 (from the past 24 hour(s))  Glucose, CSF     Status: None   Collection Time: 07/18/22  3:21 PM  Result Value Ref Range   Glucose, CSF 62 40 - 70 mg/dL  Protein, CSF     Status: None   Collection Time: 07/18/22  3:21 PM  Result Value Ref Range   Total  Protein, CSF 43 15 - 45 mg/dL  CSF cell count with differential     Status: None   Collection Time: 07/18/22  3:21 PM  Result Value Ref Range   Tube # 3    Color, CSF COLORLESS COLORLESS   Appearance, CSF CLEAR CLEAR   Supernatant CLEAR  RBC Count, CSF 0 0 - 3 /cu mm   WBC, CSF 2 0 - 5 /cu mm  CSF culture w Gram Stain     Status: None (Preliminary result)   Collection Time: 07/18/22  3:21 PM   Specimen: PATH Cytology CSF; Cerebrospinal Fluid  Result Value Ref Range   Specimen Description      CSF Performed at [facility], [address] [place], [place] [address]    Special Requests      NONE Performed at [facility], [address] [place], [place] [address]    Gram Stain      NO ORGANISMS SEEN RED BLOOD CELLS PRESENT NO WBC SEEN Performed at [facility], [address] [place], [place] [address]    Culture      NO GROWTH < 24 HOURS Performed at [facility], [place] [address], [place], [place] [address]    Report Status PENDING   RPR     Status: None   Collection Time: 07/18/22  3:21 PM  Result Value Ref Range   RPR Ser Ql NON REACTIVE NON REACTIVE  Vitamin B12     Status: None   Collection Time: 07/18/22  5:22 PM  Result Value Ref Range   Vitamin B-12 344 180 - 914 pg/mL  Ammonia     Status: None   Collection  Time: 07/18/22  5:22 PM  Result Value Ref Range   Ammonia 18 9 - 35 umol/L  HIV Antibody (routine testing w rflx)     Status: None   Collection Time: 07/18/22  5:22 PM  Result Value Ref Range   HIV Screen 4th Generation wRfx Non Reactive Non Reactive  Comprehensive metabolic panel     Status: Abnormal   Collection Time: 07/19/22  3:49 AM  Result Value Ref Range   Sodium 139 135 - 145 mmol/L   Potassium 3.6 3.5 - 5.1 mmol/L   Chloride 110 98 - 111 mmol/L   CO2 24 22 - 32 mmol/L   Glucose, Bld 84 70 - 99 mg/dL   BUN <5 (L) 8 - 23 mg/dL   Creatinine, Ser 0.49 0.44 - 1.00 mg/dL   Calcium 8.6 (L) 8.9 - 10.3 mg/dL   Total Protein 5.9 (L) 6.5 - 8.1 g/dL   Albumin 3.4 (L) 3.5 - 5.0 g/dL   AST 18 15 - 41 U/L   ALT 12 0 - 44 U/L   Alkaline Phosphatase 47 38 - 126 U/L   Total Bilirubin 0.5 0.3 - 1.2 mg/dL   GFR, Estimated >60 >60 mL/min   Anion gap 5 5 - 15  CBC     Status: None   Collection Time: 07/19/22  3:49 AM  Result Value Ref Range   WBC 7.4 4.0 - 10.5 K/uL   RBC 4.37 3.87 - 5.11 MIL/uL   Hemoglobin 13.9 12.0 - 15.0 g/dL   HCT 42.9 36.0 - 46.0 %   MCV 98.2 80.0 - 100.0 fL   MCH 31.8 26.0 - 34.0 pg   MCHC 32.4 30.0 - 36.0 g/dL   RDW 12.8 11.5 - 15.5 %   Platelets 390 150 - 400 K/uL   nRBC 0.0 0.0 - 0.2 %  Magnesium     Status: None   Collection Time: 07/19/22  3:49 AM  Result Value Ref Range   Magnesium 2.2 1.7 - 2.4 mg/dL  Phosphorus     Status: None   Collection Time: 07/19/22  3:49 AM  Result  Value Ref Range   Phosphorus 3.5 2.5 - 4.6 mg/dL  APTT     Status: None   Collection Time: 07/19/22  3:49 AM  Result Value Ref Range   aPTT 36 24 - 36 seconds    I have Reviewed nursing notes, Vitals, and Lab results since pt's last encounter. Pertinent lab results : see above I have ordered test including BMP, CBC, Mg I have reviewed the last note from staff over past 24 hours I have discussed pt's care plan and test results with nursing staff, case manager   LOS: 1 day    Dwyane Dee, MD [facility] 07/19/2022, 3:16 PM

## 2022-07-19 NOTE — Assessment & Plan Note (Signed)
-   Presumed reactive - Platelets have normalized today - Continue intermittent monitoring

## 2022-07-19 NOTE — ED Notes (Signed)
Pt asleep in bed at this time. Call bell within reach

## 2022-07-19 NOTE — Assessment & Plan Note (Signed)
Continue Protonix °

## 2022-07-19 NOTE — Assessment & Plan Note (Signed)
Per Dr. Sabino Gasser: - Patient has historically been on Celexa.  She is unable to definitively state how consistent she is with taking it; her daughter thinks that she misses doses here and there as sometimes she takes it "when needed" -On 07/07/2022, she was discontinued from [place] and started on Seroquel to help with sleep at night but family has not noticed any significant benefit at this time - Discussed with patient and family in the ER.  For now to avoid any significant SSRI withdrawal, agree on placing back on Celexa at this time and encouraging her to take it consistently.  Discontinue Seroquel

## 2022-07-19 NOTE — ED Notes (Signed)
Pt ambulated to restroom in hallway and back to bed with minimal assistance. Call bell within reach.

## 2022-07-19 NOTE — Assessment & Plan Note (Addendum)
Unclear etiology at this time and odd presentation.  Family describe that she has been acting very abnormal, increased irritability, poor sleep, stuttering at times, difficulty with concentration, difficulty forming thought processes. Extensive workup was negative thus far. Completed 5 days empiric high dose IV steroids given some concern for ?autoimmune encephalitis based on MRI findings --follow up M/E panel sent to [facility] --Neurology consulted, see their Assessment and Recommendations --[facility] next week for close follow up  Also seen by by psychiatry as well with "no obvious psychiatric conditions".  She was not felt to be psychotic or depressed per psychiatry assessment.  On day of discharge, updated psychiatry about unchanged manic-like symptoms and behaviors, they recommended not starting any medication at this time.  No further recommendations.

## 2022-07-19 NOTE — Assessment & Plan Note (Signed)
-   Continue amlodipine ?

## 2022-07-19 NOTE — Assessment & Plan Note (Addendum)
Patient endorsed smoking approximately 2 PPD.  Recently due to increased level of stress, was reportedly up to 3 PPD. - Patient strongly encouraged to cut back and try to quit given underlying emphysematous lung changes; she also has several small stable nodules noted on lung cancer screening CT - Continue nicotine patch

## 2022-07-19 NOTE — Progress Notes (Signed)
S: No change. Patient remains pleasantly confused. No new neurologic complaints today.   Interval data Ammonia, RPR WNL B12 344  CSF WBC 2, RBC 0 Protein 43 Glucose 62 Pending: VDRL, culture, OCB, send out to [facility] autoimmune encephalopathy panel Meningitis PCR panel and cytology were not run 2/2 WBC 2  CT c/a/p - no e/o malignancy CTA H&N - no e/o vasculitis rEEG - normal  O:  Vitals:   07/19/22 2036 07/20/22 0632  BP: 140/66 117/80  Pulse: 72 71  Resp: 19 18  Temp: (!) 97.5 F (36.4 C) 98.2 F (36.8 C)  SpO2: 95% 94%   Physical Exam Gen: A&O x4, NAD HEENT: Atraumatic, normocephalic;mucous membranes moist; oropharynx clear, tongue without atrophy or fasciculations. Neck: Supple, trachea midline. Resp: CTAB, no w/r/r CV: RRR, no m/g/r; nml S1 and S2. 2+ symmetric peripheral pulses. Abd: soft/NT/ND; nabs x 4 quad Extrem: Nml bulk; no cyanosis, clubbing, or edema.   Neuro: *MS: A&O x4. Follows simple commands but not multi-step. Able to calculate change. Poor attention. Registration 3/3. Delayed recall 0/3 (does state 3 objects, but not the 3 examiner asked her to remember) *Speech: fluid, nondysarthric, able to name and repeat *CN:    I: Deferred   II,III: PERRLA, VFF by confrontation, optic discs unable to be visualized 2/2 pupillary constriction   III,IV,VI: EOMI w/o nystagmus, no ptosis   V: Sensation intact from V1 to V3 to LT   VII: Eyelid closure was full.  Smile symmetric.   VIII: Hearing intact to voice   IX,X: Voice normal, palate elevates symmetrically    XI: SCM/trap 5/5 bilat   XII: Tongue protrudes midline, no atrophy or fasciculations    *Motor:   Normal bulk.  No tremor, rigidity or bradykinesia. No pronator drift.     Strength: Dlt Bic Tri WrE WrF FgS Gr HF KnF KnE PlF DoF    Left '5 5 5 5 5 5 5 5 5 5 5 5    '$ Right '5 5 5 5 5 5 5 5 5 5 5 5      '$ *Sensory: Intact to light touch, pinprick, temperature vibration throughout. Symmetric.  Propioception intact bilat.  No double-simultaneous extinction.  *Coordination:  Dysmetria on bilateral FNF without frank ataxia *Reflexes:  2+ brisk and symmetric throughout without clonus; toes w/d bilat *Gait: deferred  A/P: This is a 66 yo woman admitted with rapidly progressive cognitive decline with extensive psychiatric overlay over the past 3 weeks. Prior to that she was cognitively normal without any neurologic deficits. The rapidity of her progression is concerning and she is admitted for expedited workup. CMP and UA are wholly unremarkable. CBC notable for only a mild thrombocytosis, likely reactive. TSH, RPR, ammonia, B12 WNL.    The MRI brain shows abnormal T2/FLAIR hyperintensity diffusely (symmetric) in the pons and part of the medulla. There is no abnormal contrast enhancement. This in combination with her rapidly progressive cognitive decline and acute psychiatric disturbance can be seen in the setting of rhombencephalitis. Etiologies can be paraneoplastic (particularly given patient's extensive hx tobacco abuse) or infectious. CSF showed WBC 2, not c/f infection. Given her extensive smoking hx and concern for potential paraneoplastic syndrome however CT c/a/p showed no e/o malignancy at this time. CTA H&N showed no e/o vasculitis. rEEG WNL.    Radiographic ddx for MRI abnormalities include osmotic demyelination syndrome (no history c/w this) and chronic small vessel ischemic disease (unlikely in this patient with very little burden CSVID overall in bilateral  cerebral hemispheres).   Plan for 5 days high dose steroids empiric tx for autoimmune encephalitis. Antibody testing panels will take approximately 3 weeks to result and treatment should not be delayed waiting for these.   Neuro-Behcets can present with similar parenchymal lesions but husband is unaware of patient having any history of oral or genital ulcers. There is no pathognomonic test for Behcets.   - F/u outstanding CSF  labs and ENC2 autoimmune encephalopathy panel send out to [facility] clinic - ENS2 serum encephalopathy panel send out to [facility] to start high dose steroids tomorrow '1000mg'$  solumedrol IV daily x5 days   Will continue to follow.

## 2022-07-20 DIAGNOSIS — R4689 Other symptoms and signs involving appearance and behavior: Secondary | ICD-10-CM | POA: Diagnosis not present

## 2022-07-20 LAB — COMPREHENSIVE METABOLIC PANEL
ALT: 11 U/L (ref 0–44)
AST: 16 U/L (ref 15–41)
Albumin: 3.6 g/dL (ref 3.5–5.0)
Alkaline Phosphatase: 45 U/L (ref 38–126)
Anion gap: 6 (ref 5–15)
BUN: 6 mg/dL — ABNORMAL LOW (ref 8–23)
CO2: 26 mmol/L (ref 22–32)
Calcium: 8.9 mg/dL (ref 8.9–10.3)
Chloride: 105 mmol/L (ref 98–111)
Creatinine, Ser: 0.49 mg/dL (ref 0.44–1.00)
GFR, Estimated: >60 mL/min (ref >60)
Glucose, Bld: 119 mg/dL — ABNORMAL HIGH (ref 70–99)
Potassium: 3.3 mmol/L — ABNORMAL LOW (ref 3.5–5.1)
Sodium: 137 mmol/L (ref 135–145)
Total Bilirubin: 0.5 mg/dL (ref 0.3–1.2)
Total Protein: 6.2 g/dL — ABNORMAL LOW (ref 6.5–8.1)

## 2022-07-20 LAB — CBC WITH DIFFERENTIAL/PLATELET
Abs Immature Granulocytes: 0.02 10*3/uL (ref 0.00–0.07)
Basophils Absolute: 0.1 10*3/uL (ref 0.0–0.1)
Basophils Relative: 1 %
Eosinophils Absolute: 0.1 10*3/uL (ref 0.0–0.5)
Eosinophils Relative: 3 %
HCT: 40.5 % (ref 36.0–46.0)
Hemoglobin: 13.5 g/dL (ref 12.0–15.0)
Immature Granulocytes: 0 %
Lymphocytes Relative: 29 %
Lymphs Abs: 1.6 10*3/uL (ref 0.7–4.0)
MCH: 32.2 pg (ref 26.0–34.0)
MCHC: 33.3 g/dL (ref 30.0–36.0)
MCV: 96.7 fL (ref 80.0–100.0)
Monocytes Absolute: 0.6 10*3/uL (ref 0.1–1.0)
Monocytes Relative: 10 %
Neutro Abs: 3.3 10*3/uL (ref 1.7–7.7)
Neutrophils Relative %: 57 %
Platelets: 363 10*3/uL (ref 150–400)
RBC: 4.19 MIL/uL (ref 3.87–5.11)
RDW: 12.7 % (ref 11.5–15.5)
WBC: 5.7 10*3/uL (ref 4.0–10.5)
nRBC: 0 % (ref 0.0–0.2)

## 2022-07-20 LAB — MAGNESIUM: Magnesium: 2 mg/dL (ref 1.7–2.4)

## 2022-07-20 MED ORDER — SENNOSIDES-DOCUSATE SODIUM 8.6-50 MG PO TABS
1.0000 | ORAL_TABLET | Freq: Every day | ORAL | Status: DC | PRN
Start: 2022-07-20 — End: 2022-07-24

## 2022-07-20 MED ORDER — POTASSIUM CHLORIDE CRYS ER 20 MEQ PO TBCR
40.0000 meq | EXTENDED_RELEASE_TABLET | Freq: Once | ORAL | Status: AC
  Administered 2022-07-20: 40 meq via ORAL
  Filled 2022-07-20: qty 2

## 2022-07-20 NOTE — Progress Notes (Signed)
Progress Note    Yvonne Pitts   WUJ:[id number]  DOB: [birth year]  DOA: 07/18/2022     2 PCP: Tracie Harrier, MD  Initial CC: Abnormal behavior  Hospital Course: Ms. Dedmon is a 66 yo female with PMH depression, COPD, ongoing tobacco use, DDD, polycythemia who presented initially to the ER on 07/16/2022 with odd behavior. At that time she had reported that she had been on 2 courses of antibiotics outpatient for sinusitis and UTI.  Most recent course of Bactrim was completed approximately 10 days prior but they do not recall the first antibiotic.  She was in [place] when she was prescribed these.  Family states that her behavior changes began shortly after all of this.   Other medication changes include addition of Seroquel at night to help with sleep and discontinuation of Celexa without taper.  This change took place on 07/07/2022 however her behavior changes were already taking place prior to this medication change. She underwent work-up in the ER on 07/16/2022 which included CXR, CT angio head/neck, MRI brain without contrast followed by MRI brain with contrast. UDS was notable for TCA which she is not prescribed however can be seen with false positives when taking seroquel and/or flexeril (both of which were filled recently).  VBG unremarkable.  TSH normal.  UA negative for infection. Repeat UDS on 07/18/2022 was negative for presence of TCA at that time.  She was sent home from the ER on 07/16/2022 due to negative work-up and no obvious etiology for her behavior change. She was then brought back to the hospital on 07/18/2022 due to ongoing abnormal behavior.  At that point she underwent evaluation by neurology and psychiatry.  She also underwent a lumbar puncture.  After evaluation by neurology she is being trialed on high dose steroids due to concern for possible autoimmune encephalitis while further labs return.   Interval History:  No events overnight. She is still  acting/talking the same as yesterday. She is oriented to name, year, president, and place but her thought process is still disorganized some and has almost flight of ideas.   Assessment and Plan: * Abnormal behavior - very unclear etiology at this time and odd presentation - main description by family is that she has been acting very abnormal, increased irritability, poor sleep, stuttering at times, difficulty with concentration, difficulty forming thought processes - large workup has been commenced; so far negative thus far but still pending tests - follow up M/E panel from CSF and other pending tests - she has been evaluated by psychiatry as well with "no obvious psychiatric conditions".  She was not felt to be psychotic or depressed per psychiatry assessment. - trial of high dose steroid per neurology initiated on 07/20/22 given some concern for autoimmune encephalitis (follow up all pending tests)  Depression - Patient has historically been on Celexa.  She is unable to definitively state how consistent she is with taking it; her daughter thinks that she misses doses here and there as sometimes she takes it "when needed" -On 07/07/2022, she was discontinued from [place] and started on Seroquel to help with sleep at night but family has not noticed any significant benefit at this time - Discussed with patient and family in the ER.  For now to avoid any significant SSRI withdrawal, agree on placing back on Celexa at this time and encouraging her to take it consistently.  Discontinue Seroquel  Tobacco abuse - Patient endorses smoking approximately 2 PPD.  Recently due to  increased level of stress she has been smoking up to 3 PPD - Patient strongly encouraged to cut back and try to quit given underlying emphysematous lung changes; she also has several small stable nodules noted on lung cancer screening CT - Continue nicotine patch  Thrombocytosis-resolved as of 07/19/2022 - Presumed reactive -  Platelets have normalized today - Continue intermittent monitoring  Essential hypertension - Continue amlodipine  GERD (gastroesophageal reflux disease) - Continue Protonix  COPD, moderate (HCC) - No signs or symptoms of exacerbation - Continue albuterol and Dulera   Old records reviewed in assessment of this patient  Antimicrobials:   DVT prophylaxis:  enoxaparin (LOVENOX) injection 40 mg Start: 07/19/22 0800 SCDs Start: 07/18/22 1910 SCDs Start: 07/18/22 1910   Code Status:   Code Status: Full Code  Mobility Assessment (last 72 hours)     Mobility Assessment     Row Name 07/20/22 1100 07/19/22 1941         Does patient have an order for bedrest or is patient medically unstable No - Continue assessment No - Continue assessment      What is the highest level of mobility based on the progressive mobility assessment? Level 5 (Walks with assist in room/hall) - Balance while stepping forward/back and can walk in room with assist - Complete Level 6 (Walks independently in room and hall) - Balance while walking in room without assist - Complete               Disposition Plan:  Home 4-5 days Status is: Inpt  Objective: Blood pressure (!) 163/85, pulse 65, temperature 98.4 F (36.9 C), resp. rate 16, height '5\' 4"'$  (1.626 m), weight 53.5 kg, SpO2 96 %.  Examination:  Physical Exam Constitutional:      General: She is not in acute distress.    Appearance: She is not ill-appearing.     Comments: Adult woman laying in bed in no perceived distress with obvious erratic behavior  HENT:     Head: Normocephalic and atraumatic.     Mouth/Throat:     Mouth: Mucous membranes are moist.  Eyes:     Extraocular Movements: Extraocular movements intact.  Cardiovascular:     Rate and Rhythm: Normal rate and regular rhythm.  Pulmonary:     Effort: Pulmonary effort is normal.     Breath sounds: Normal breath sounds.  Abdominal:     General: Bowel sounds are normal. There is no  distension.     Palpations: Abdomen is soft.     Tenderness: There is no abdominal tenderness.  Musculoskeletal:        General: Normal range of motion.     Cervical back: Normal range of motion and neck supple. No rigidity.  Skin:    General: Skin is warm and dry.  Neurological:     General: No focal deficit present.     Mental Status: She is oriented to person, place, and time.  Psychiatric:     Comments: Poor and disorganized thought process, possibly tangential. Odd affect.      Consultants:  Neurology Psychiatry   Procedures:  LP, 07/18/22  Data Reviewed: Results for orders placed or performed during the hospital encounter of 07/18/22 (from the past 24 hour(s))  CBC with Differential/Platelet     Status: None   Collection Time: 07/20/22  4:52 AM  Result Value Ref Range   WBC 5.7 4.0 - 10.5 K/uL   RBC 4.19 3.87 - 5.11 MIL/uL   Hemoglobin 13.5 12.0 -  15.0 g/dL   HCT 40.5 36.0 - 46.0 %   MCV 96.7 80.0 - 100.0 fL   MCH 32.2 26.0 - 34.0 pg   MCHC 33.3 30.0 - 36.0 g/dL   RDW 12.7 11.5 - 15.5 %   Platelets 363 150 - 400 K/uL   nRBC 0.0 0.0 - 0.2 %   Neutrophils Relative % 57 %   Neutro Abs 3.3 1.7 - 7.7 K/uL   Lymphocytes Relative 29 %   Lymphs Abs 1.6 0.7 - 4.0 K/uL   Monocytes Relative 10 %   Monocytes Absolute 0.6 0.1 - 1.0 K/uL   Eosinophils Relative 3 %   Eosinophils Absolute 0.1 0.0 - 0.5 K/uL   Basophils Relative 1 %   Basophils Absolute 0.1 0.0 - 0.1 K/uL   Immature Granulocytes 0 %   Abs Immature Granulocytes 0.02 0.00 - 0.07 K/uL  Comprehensive metabolic panel     Status: Abnormal   Collection Time: 07/20/22  4:52 AM  Result Value Ref Range   Sodium 137 135 - 145 mmol/L   Potassium 3.3 (L) 3.5 - 5.1 mmol/L   Chloride 105 98 - 111 mmol/L   CO2 26 22 - 32 mmol/L   Glucose, Bld 119 (H) 70 - 99 mg/dL   BUN 6 (L) 8 - 23 mg/dL   Creatinine, Ser 0.49 0.44 - 1.00 mg/dL   Calcium 8.9 8.9 - 10.3 mg/dL   Total Protein 6.2 (L) 6.5 - 8.1 g/dL   Albumin 3.6 3.5  - 5.0 g/dL   AST 16 15 - 41 U/L   ALT 11 0 - 44 U/L   Alkaline Phosphatase 45 38 - 126 U/L   Total Bilirubin 0.5 0.3 - 1.2 mg/dL   GFR, Estimated >60 >60 mL/min   Anion gap 6 5 - 15  Magnesium     Status: None   Collection Time: 07/20/22  4:52 AM  Result Value Ref Range   Magnesium 2.0 1.7 - 2.4 mg/dL    I have Reviewed nursing notes, Vitals, and Lab results since pt's last encounter. Pertinent lab results : see above I have ordered test including BMP, CBC, Mg I have reviewed the last note from staff over past 24 hours I have discussed pt's care plan and test results with nursing staff, case manager   LOS: 2 days   Dwyane Dee, MD [facility] 07/20/2022, 12:56 PM

## 2022-07-21 DIAGNOSIS — R4689 Other symptoms and signs involving appearance and behavior: Secondary | ICD-10-CM | POA: Diagnosis not present

## 2022-07-21 DIAGNOSIS — R4182 Altered mental status, unspecified: Secondary | ICD-10-CM | POA: Diagnosis not present

## 2022-07-21 LAB — COMPREHENSIVE METABOLIC PANEL
ALT: 13 U/L (ref 0–44)
AST: 16 U/L (ref 15–41)
Albumin: 4 g/dL (ref 3.5–5.0)
Alkaline Phosphatase: 49 U/L (ref 38–126)
Anion gap: 8 (ref 5–15)
BUN: 9 mg/dL (ref 8–23)
CO2: 27 mmol/L (ref 22–32)
Calcium: 10.1 mg/dL (ref 8.9–10.3)
Chloride: 104 mmol/L (ref 98–111)
Creatinine, Ser: 0.38 mg/dL — ABNORMAL LOW (ref 0.44–1.00)
GFR, Estimated: >60 mL/min (ref >60)
Glucose, Bld: 149 mg/dL — ABNORMAL HIGH (ref 70–99)
Potassium: 4.4 mmol/L (ref 3.5–5.1)
Sodium: 139 mmol/L (ref 135–145)
Total Bilirubin: 0.4 mg/dL (ref 0.3–1.2)
Total Protein: 7.1 g/dL (ref 6.5–8.1)

## 2022-07-21 LAB — CBC WITH DIFFERENTIAL/PLATELET
Abs Immature Granulocytes: 0.04 K/uL (ref 0.00–0.07)
Basophils Absolute: 0 K/uL (ref 0.0–0.1)
Basophils Relative: 0 %
Eosinophils Absolute: 0 K/uL (ref 0.0–0.5)
Eosinophils Relative: 0 %
HCT: 44.6 % (ref 36.0–46.0)
Hemoglobin: 14.4 g/dL (ref 12.0–15.0)
Immature Granulocytes: 1 %
Lymphocytes Relative: 9 %
Lymphs Abs: 0.5 K/uL — ABNORMAL LOW (ref 0.7–4.0)
MCH: 31.2 pg (ref 26.0–34.0)
MCHC: 32.3 g/dL (ref 30.0–36.0)
MCV: 96.7 fL (ref 80.0–100.0)
Monocytes Absolute: 0.1 K/uL (ref 0.1–1.0)
Monocytes Relative: 1 %
Neutro Abs: 5.7 K/uL (ref 1.7–7.7)
Neutrophils Relative %: 89 %
Platelets: 444 K/uL — ABNORMAL HIGH (ref 150–400)
RBC: 4.61 MIL/uL (ref 3.87–5.11)
RDW: 12.5 % (ref 11.5–15.5)
WBC: 6.4 K/uL (ref 4.0–10.5)
nRBC: 0 % (ref 0.0–0.2)

## 2022-07-21 LAB — MAGNESIUM: Magnesium: 2.2 mg/dL (ref 1.7–2.4)

## 2022-07-21 MED ORDER — VITAMIN B-12 1000 MCG PO TABS
1000.0000 ug | ORAL_TABLET | Freq: Every day | ORAL | Status: DC
Start: 2022-07-21 — End: 2022-07-24
  Administered 2022-07-21 – 2022-07-24 (×4): 1000 ug via ORAL
  Filled 2022-07-21 (×4): qty 1

## 2022-07-21 NOTE — Care Management Important Message (Signed)
Important Message  Patient Details  Name: Yvonne Pitts MRN: [id number] Date of Birth: July 24, 1956   Medicare Important Message Given:  N/A - LOS <3 / Initial given by admissions     Juliann Pulse A Erikson Danzy 07/21/2022, 8:09 AM

## 2022-07-21 NOTE — Progress Notes (Signed)
   07/21/22 1515  Clinical Encounter Type  Visited With Patient and family together  Visit Type Initial  Referral From Patient  Consult/Referral To Chaplain  Spiritual Encounters  Spiritual Needs Prayer   Chaplain Eain Mullendore responded to a request for visitation. Provided some soical support and engaged family who was present; inquired if pt might like a visit later after family had left and that was agreeable.  Chaplain B still spent time visiting and offered prayer which was much appreciated. Passed on referral for possible f/u tonight.

## 2022-07-21 NOTE — Procedures (Signed)
Routine EEG Report  Yvonne Pitts is a 66 y.o. female with a history of encephalopathy who is undergoing an EEG to evaluate for seizures.  Report: This EEG was acquired with electrodes placed according to the International 10-20 electrode system (including Fp1, Fp2, F3, F4, C3, C4, P3, P4, O1, O2, T3, T4, T5, T6, A1, A2, Fz, Cz, Pz). The following electrodes were missing or displaced: none.  The occipital dominant rhythm was 12 Hz. This activity is reactive to stimulation. Drowsiness was manifested by background fragmentation; deeper stages of sleep were identified by K complexes and sleep spindles. There was no focal slowing. There were no interictal epileptiform discharges. There were no electrographic seizures identified. There was no abnormal response to photic stimulation or hyperventilation.   Impression: This EEG was obtained while awake and asleep and is normal.    Clinical Correlation: Normal EEGs, however, do not rule out epilepsy.  Su Monks, MD [facility] [phone]  If 7pm- 7am, please page neurology on call as listed in [place].

## 2022-07-21 NOTE — Progress Notes (Signed)
Progress Note    Gregoria Selvy   VPX:[id number]  DOB: Sep 13, 1956  DOA: 07/18/2022     3 PCP: Tracie Harrier, MD  Initial CC: Abnormal behavior  Hospital Course: Ms. Savich is a 66 yo female with PMH depression, COPD, ongoing tobacco use, DDD, polycythemia who presented initially to the ER on 07/16/2022 with odd behavior. At that time she had reported that she had been on 2 courses of antibiotics outpatient for sinusitis and UTI.  Most recent course of Bactrim was completed approximately 10 days prior but they do not recall the first antibiotic.  She was in [place] when she was prescribed these.  Family states that her behavior changes began shortly after all of this.   Other medication changes include addition of Seroquel at night to help with sleep and discontinuation of Celexa without taper.  This change took place on 07/07/2022 however her behavior changes were already taking place prior to this medication change. She underwent work-up in the ER on 07/16/2022 which included CXR, CT angio head/neck, MRI brain without contrast followed by MRI brain with contrast. UDS was notable for TCA which she is not prescribed however can be seen with false positives when taking seroquel and/or flexeril (both of which were filled recently).  VBG unremarkable.  TSH normal.  UA negative for infection. Repeat UDS on 07/18/2022 was negative for presence of TCA at that time.  She was sent home from the ER on 07/16/2022 due to negative work-up and no obvious etiology for her behavior change. She was then brought back to the hospital on 07/18/2022 due to ongoing abnormal behavior.  At that point she underwent evaluation by neurology and psychiatry.  She also underwent a lumbar puncture.  After evaluation by neurology she is being trialed on high dose steroids due to concern for possible autoimmune encephalitis while further labs return.   Interval History:  No events overnight.  Husband still feels  she is not back to baseline.  She continues to have a little bit of on behavior which is best described as being "overly happy".  Still has some difficulty with thought process and organization.  Assessment and Plan: * Abnormal behavior - very unclear etiology at this time and odd presentation - main description by family is that she has been acting very abnormal, increased irritability, poor sleep, stuttering at times, difficulty with concentration, difficulty forming thought processes - large workup has been commenced; so far negative thus far but still pending tests - follow up M/E panel from CSF and other pending tests - she has been evaluated by psychiatry as well with "no obvious psychiatric conditions".  She was not felt to be psychotic or depressed per psychiatry assessment. - trial of high dose steroid per neurology initiated on 07/20/22 given some concern for autoimmune encephalitis (follow up all pending tests)  Depression - Patient has historically been on Celexa.  She is unable to definitively state how consistent she is with taking it; her daughter thinks that she misses doses here and there as sometimes she takes it "when needed" -On 07/07/2022, she was discontinued from [place] and started on Seroquel to help with sleep at night but family has not noticed any significant benefit at this time - Discussed with patient and family in the ER.  For now to avoid any significant SSRI withdrawal, agree on placing back on Celexa at this time and encouraging her to take it consistently.  Discontinue Seroquel  Tobacco abuse - Patient endorses smoking  approximately 2 PPD.  Recently due to increased level of stress she has been smoking up to 3 PPD - Patient strongly encouraged to cut back and try to quit given underlying emphysematous lung changes; she also has several small stable nodules noted on lung cancer screening CT - Continue nicotine patch  Thrombocytosis-resolved as of 07/19/2022 -  Presumed reactive - Platelets have normalized  - Continue intermittent monitoring  Essential hypertension - Continue amlodipine  GERD (gastroesophageal reflux disease) - Continue Protonix  COPD, moderate ([place]) - No signs or symptoms of exacerbation - Continue albuterol and Dulera   Old records reviewed in assessment of this patient  Antimicrobials:   DVT prophylaxis:  enoxaparin (LOVENOX) injection 40 mg Start: 07/19/22 0800 SCDs Start: 07/18/22 1910 SCDs Start: 07/18/22 1910   Code Status:   Code Status: Full Code  Mobility Assessment (last 72 hours)     Mobility Assessment     Row Name 07/21/22 0809 07/20/22 1100 07/19/22 1941       Does patient have an order for bedrest or is patient medically unstable No - Continue assessment No - Continue assessment No - Continue assessment     What is the highest level of mobility based on the progressive mobility assessment? Level 5 (Walks with assist in room/hall) - Balance while stepping forward/back and can walk in room with assist - Complete Level 5 (Walks with assist in room/hall) - Balance while stepping forward/back and can walk in room with assist - Complete Level 6 (Walks independently in room and hall) - Balance while walking in room without assist - Complete              Disposition Plan:  Home 4-5 days Status is: Inpt  Objective: Blood pressure 125/61, pulse 82, temperature 98.6 F (37 C), resp. rate 17, height '5\' 4"'$  (1.626 m), weight 53.5 kg, SpO2 94 %.  Examination:  Physical Exam Constitutional:      General: She is not in acute distress.    Appearance: She is not ill-appearing.     Comments: Adult woman laying in bed in no perceived distress with obvious erratic behavior  HENT:     Head: Normocephalic and atraumatic.     Mouth/Throat:     Mouth: Mucous membranes are moist.  Eyes:     Extraocular Movements: Extraocular movements intact.  Cardiovascular:     Rate and Rhythm: Normal rate and regular  rhythm.  Pulmonary:     Effort: Pulmonary effort is normal.     Breath sounds: Normal breath sounds.  Abdominal:     General: Bowel sounds are normal. There is no distension.     Palpations: Abdomen is soft.     Tenderness: There is no abdominal tenderness.  Musculoskeletal:        General: Normal range of motion.     Cervical back: Normal range of motion and neck supple. No rigidity.  Skin:    General: Skin is warm and dry.  Neurological:     General: No focal deficit present.     Mental Status: She is oriented to person, place, and time.  Psychiatric:     Comments: Poor and disorganized thought process, possibly tangential. Odd affect.      Consultants:  Neurology Psychiatry   Procedures:  LP, 07/18/22  Data Reviewed: Results for orders placed or performed during the hospital encounter of 07/18/22 (from the past 24 hour(s))  CBC with Differential/Platelet     Status: Abnormal   Collection Time: 07/21/22  5:10 AM  Result Value Ref Range   WBC 6.4 4.0 - 10.5 K/uL   RBC 4.61 3.87 - 5.11 MIL/uL   Hemoglobin 14.4 12.0 - 15.0 g/dL   HCT 44.6 36.0 - 46.0 %   MCV 96.7 80.0 - 100.0 fL   MCH 31.2 26.0 - 34.0 pg   MCHC 32.3 30.0 - 36.0 g/dL   RDW 12.5 11.5 - 15.5 %   Platelets 444 (H) 150 - 400 K/uL   nRBC 0.0 0.0 - 0.2 %   Neutrophils Relative % 89 %   Neutro Abs 5.7 1.7 - 7.7 K/uL   Lymphocytes Relative 9 %   Lymphs Abs 0.5 (L) 0.7 - 4.0 K/uL   Monocytes Relative 1 %   Monocytes Absolute 0.1 0.1 - 1.0 K/uL   Eosinophils Relative 0 %   Eosinophils Absolute 0.0 0.0 - 0.5 K/uL   Basophils Relative 0 %   Basophils Absolute 0.0 0.0 - 0.1 K/uL   Immature Granulocytes 1 %   Abs Immature Granulocytes 0.04 0.00 - 0.07 K/uL  Comprehensive metabolic panel     Status: Abnormal   Collection Time: 07/21/22  5:10 AM  Result Value Ref Range   Sodium 139 135 - 145 mmol/L   Potassium 4.4 3.5 - 5.1 mmol/L   Chloride 104 98 - 111 mmol/L   CO2 27 22 - 32 mmol/L   Glucose, Bld 149 (H)  70 - 99 mg/dL   BUN 9 8 - 23 mg/dL   Creatinine, Ser 0.38 (L) 0.44 - 1.00 mg/dL   Calcium 10.1 8.9 - 10.3 mg/dL   Total Protein 7.1 6.5 - 8.1 g/dL   Albumin 4.0 3.5 - 5.0 g/dL   AST 16 15 - 41 U/L   ALT 13 0 - 44 U/L   Alkaline Phosphatase 49 38 - 126 U/L   Total Bilirubin 0.4 0.3 - 1.2 mg/dL   GFR, Estimated >60 >60 mL/min   Anion gap 8 5 - 15  Magnesium     Status: None   Collection Time: 07/21/22  5:10 AM  Result Value Ref Range   Magnesium 2.2 1.7 - 2.4 mg/dL    I have Reviewed nursing notes, Vitals, and Lab results since pt's last encounter. Pertinent lab results : see above I have ordered test including BMP, CBC, Mg I have reviewed the last note from staff over past 24 hours I have discussed pt's care plan and test results with nursing staff, case manager   LOS: 3 days   Dwyane Dee, MD [facility] 07/21/2022, 1:43 PM

## 2022-07-21 NOTE — Progress Notes (Signed)
S:  Seen and examined No acute changes Husband feels she is still not baseline   Data  CSF WBC 2, RBC 0 Protein 43 Glucose 62 Pending: VDRL, culture, OCB, send out to [facility] autoimmune encephalopathy panel on CSF Meningitis PCR panel and cytology were not run 2/2 WBC 2  Ammonia, RPR WNL B12 344 MMA, homocysteine, send out to [facility] clinic ENS2 autoimmune encephalopathy panel on serum is pending  CT c/a/p - no e/o malignancy CTA H&N - no e/o vasculitis rEEG - normal  O:  Vitals:   07/21/22 0415 07/21/22 0901  BP: 120/63 125/61  Pulse: 87 82  Resp: 16 17  Temp: 97.8 F (36.6 C) 98.6 F (37 C)  SpO2: 96% 94%   Physical Exam Gen: A&O x4, NAD HEENT: Atraumatic, normocephalic;mucous membranes moist; oropharynx clear, tongue without atrophy or fasciculations. Neck: Supple, trachea midline. Resp: CTAB, no w/r/r CV: RRR, no m/g/r; nml S1 and S2. 2+ symmetric peripheral pulses. Abd: soft/NT/ND; nabs x 4 quad Extrem: Nml bulk; no cyanosis, clubbing, or edema.   Neuro: *MS: A&O x4. Follows simple commands but not multi-step. Able to calculate change. Poor attention. Registration 3/3. Delayed recall 0/3 (does state 3 objects, but not the 3 examiner asked her to remember) Somewhat inappropriate material in her speech (disinhibited) *Speech: fluid, nondysarthric, able to name and repeat *CN:    I: Deferred   II,III: PERRLA, VFF by confrontation, optic discs unable to be visualized 2/2 pupillary constriction   III,IV,VI: EOMI w/o nystagmus, no ptosis   V: Sensation intact from V1 to V3 to LT   VII: Eyelid closure was full.  Smile symmetric.   VIII: Hearing intact to voice   IX,X: Voice normal, palate elevates symmetrically    XI: SCM/trap 5/5 bilat   XII: Tongue protrudes midline, no atrophy or fasciculations    *Motor:   Normal bulk.  No tremor, rigidity or bradykinesia. No pronator drift.     Strength: Dlt Bic Tri WrE WrF FgS Gr HF KnF KnE PlF DoF    Left '5 5 5  5 5 5 5 5 5 5 5 5    '$ Right '5 5 5 5 5 5 5 5 5 5 5 5      '$ *Sensory: Intact to light touch, pinprick, temperature vibration throughout. Symmetric. Propioception intact bilat.  No double-simultaneous extinction.  *Coordination:  Dysmetria on bilateral FNF without frank ataxia *Reflexes:  2+ brisk and symmetric throughout without clonus; toes w/d bilat *Gait: seen ambulating in the hall with a walker, gait is slow with small steps but no ataxia or shuffling  A/P:  66 yo woman admitted with rapidly progressive cognitive decline with extensive psychiatric overlay over the past 3 weeks. Prior to that she was cognitively normal without any neurologic deficits. The rapidity of her progression is concerning and she is admitted for expedited workup. CMP and UA are wholly unremarkable. CBC notable for only a mild thrombocytosis, likely reactive. TSH, RPR, ammonia, WNL. B12 borderline at 344.   The MRI brain shows abnormal T2/FLAIR hyperintensity diffusely (symmetric) in the pons and part of the medulla. There is no abnormal contrast enhancement. This in combination with her rapidly progressive cognitive decline and acute psychiatric disturbance can be seen in the setting of rhombencephalitis. Etiologies can be paraneoplastic (particularly given patient's extensive hx tobacco abuse) or infectious. CSF showed WBC 2, not c/f infection. Given her extensive smoking hx and concern for potential paraneoplastic syndrome however CT c/a/p showed no e/o malignancy at  this time. CTA H&N showed no e/o vasculitis. rEEG WNL.    Radiographic ddx for MRI abnormalities include osmotic demyelination syndrome (no history c/w this) and chronic small vessel ischemic disease (unlikely in this patient with very little burden CSVID overall in bilateral cerebral hemispheres).   Plan for 5 days high dose steroids empiric tx for autoimmune encephalitis. Antibody testing panels will take approximately 3 weeks to result and treatment should  not be delayed waiting for these.   Neuro-Behcets can present with similar parenchymal lesions but husband is unaware of patient having any history of oral or genital ulcers. There is no pathognomonic test for Behcets.   - F/u outstanding CSF labs and ENC2 autoimmune encephalopathy panel send out to [facility] clinic - F/u MMA, homocysteine eval for B12 deficiency (B12 level borderline at 344)-replete  - F/u ENS2 serum encephalopathy panel send out to [facility] clinic - Continue '1000mg'$  solumedrol IV daily x5 days end date 7/27 - May need to consider IVIG if steroids are not effective Will continue to follow.  -- Amie Portland, MD Neurologist [facility] Pager: [phone]

## 2022-07-21 NOTE — Progress Notes (Addendum)
S: No change. Patient remains pleasantly confused. She received day 1 of solumedrol today and did not have any further issues with agitation.  Data  CSF WBC 2, RBC 0 Protein 43 Glucose 62 Pending: VDRL, culture, OCB, send out to [facility] clinic [facility] autoimmune encephalopathy panel on CSF Meningitis PCR panel and cytology were not run 2/2 WBC 2  Ammonia, RPR WNL B12 344 MMA, homocysteine, send out to [facility] clinic ENS2 autoimmune encephalopathy panel on serum is pending  CT c/a/p - no e/o malignancy CTA H&N - no e/o vasculitis rEEG - normal  O:  Vitals:   07/20/22 2030 07/21/22 0415  BP: 131/67 120/63  Pulse: 64 87  Resp: 20 16  Temp: 97.9 F (36.6 C) 97.8 F (36.6 C)  SpO2: 96% 96%   Physical Exam Gen: A&O x4, NAD HEENT: Atraumatic, normocephalic;mucous membranes moist; oropharynx clear, tongue without atrophy or fasciculations. Neck: Supple, trachea midline. Resp: CTAB, no w/r/r CV: RRR, no m/g/r; nml S1 and S2. 2+ symmetric peripheral pulses. Abd: soft/NT/ND; nabs x 4 quad Extrem: Nml bulk; no cyanosis, clubbing, or edema.   Neuro: *MS: A&O x4. Follows simple commands but not multi-step. Able to calculate change. Poor attention. Registration 3/3. Delayed recall 0/3 (does state 3 objects, but not the 3 examiner asked her to remember) *Speech: fluid, nondysarthric, able to name and repeat *CN:    I: Deferred   II,III: PERRLA, VFF by confrontation, optic discs unable to be visualized 2/2 pupillary constriction   III,IV,VI: EOMI w/o nystagmus, no ptosis   V: Sensation intact from V1 to V3 to LT   VII: Eyelid closure was full.  Smile symmetric.   VIII: Hearing intact to voice   IX,X: Voice normal, palate elevates symmetrically    XI: SCM/trap 5/5 bilat   XII: Tongue protrudes midline, no atrophy or fasciculations    *Motor:   Normal bulk.  No tremor, rigidity or bradykinesia. No pronator drift.     Strength: Dlt Bic Tri WrE WrF FgS Gr HF KnF KnE PlF DoF    Left '5 5 5 5  5 5 5 5 5 5 5 5    '$ Right '5 5 5 5 5 5 5 5 5 5 5 5      '$ *Sensory: Intact to light touch, pinprick, temperature vibration throughout. Symmetric. Propioception intact bilat.  No double-simultaneous extinction.  *Coordination:  Dysmetria on bilateral FNF without frank ataxia *Reflexes:  2+ brisk and symmetric throughout without clonus; toes w/d bilat *Gait: seen ambulating in the hall with a walker, gait is slow with small steps but no ataxia or shuffling  A/P: This is a 66 yo woman admitted with rapidly progressive cognitive decline with extensive psychiatric overlay over the past 3 weeks. Prior to that she was cognitively normal without any neurologic deficits. The rapidity of her progression is concerning and she is admitted for expedited workup. CMP and UA are wholly unremarkable. CBC notable for only a mild thrombocytosis, likely reactive. TSH, RPR, ammonia, WNL. B12 borderline at 344.   The MRI brain shows abnormal T2/FLAIR hyperintensity diffusely (symmetric) in the pons and part of the medulla. There is no abnormal contrast enhancement. This in combination with her rapidly progressive cognitive decline and acute psychiatric disturbance can be seen in the setting of rhombencephalitis. Etiologies can be paraneoplastic (particularly given patient's extensive hx tobacco abuse) or infectious. CSF showed WBC 2, not c/f infection. Given her extensive smoking hx and concern for potential paraneoplastic syndrome however CT c/a/p showed no e/o  malignancy at this time. CTA H&N showed no e/o vasculitis. rEEG WNL.    Radiographic ddx for MRI abnormalities include osmotic demyelination syndrome (no history c/w this) and chronic small vessel ischemic disease (unlikely in this patient with very little burden CSVID overall in bilateral cerebral hemispheres).   Plan for 5 days high dose steroids empiric tx for autoimmune encephalitis. Antibody testing panels will take approximately 3 weeks to result and treatment  should not be delayed waiting for these.   Neuro-Behcets can present with similar parenchymal lesions but husband is unaware of patient having any history of oral or genital ulcers. There is no pathognomonic test for Behcets.   - F/u outstanding CSF labs and ENC2 autoimmune encephalopathy panel send out to [facility] clinic - F/u MMA, homocysteine eval for B12 deficiency (B12 level borderline at 344) - F/u ENS2 serum encephalopathy panel send out to [facility] clinic - Continue '1000mg'$  solumedrol IV daily x5 days end date 7/27   Will continue to follow.  Su Monks, MD [facility] [phone]  If 7pm- 7am, please page neurology on call as listed in [place].

## 2022-07-22 DIAGNOSIS — D72829 Elevated white blood cell count, unspecified: Secondary | ICD-10-CM | POA: Diagnosis not present

## 2022-07-22 DIAGNOSIS — R4689 Other symptoms and signs involving appearance and behavior: Secondary | ICD-10-CM | POA: Diagnosis not present

## 2022-07-22 LAB — COMPREHENSIVE METABOLIC PANEL
ALT: 10 U/L (ref 0–44)
AST: 15 U/L (ref 15–41)
Albumin: 3.9 g/dL (ref 3.5–5.0)
Alkaline Phosphatase: 43 U/L (ref 38–126)
Anion gap: 9 (ref 5–15)
BUN: 13 mg/dL (ref 8–23)
CO2: 28 mmol/L (ref 22–32)
Calcium: 9.7 mg/dL (ref 8.9–10.3)
Chloride: 102 mmol/L (ref 98–111)
Creatinine, Ser: 0.44 mg/dL (ref 0.44–1.00)
GFR, Estimated: >60 mL/min (ref >60)
Glucose, Bld: 122 mg/dL — ABNORMAL HIGH (ref 70–99)
Potassium: 4 mmol/L (ref 3.5–5.1)
Sodium: 139 mmol/L (ref 135–145)
Total Bilirubin: 0.2 mg/dL — ABNORMAL LOW (ref 0.3–1.2)
Total Protein: 6.9 g/dL (ref 6.5–8.1)

## 2022-07-22 LAB — MISC LABCORP TEST (SEND OUT): Labcorp test code: 505625

## 2022-07-22 LAB — CBC WITH DIFFERENTIAL/PLATELET
Abs Immature Granulocytes: 0.1 10*3/uL — ABNORMAL HIGH (ref 0.00–0.07)
Basophils Absolute: 0 10*3/uL (ref 0.0–0.1)
Basophils Relative: 0 %
Eosinophils Absolute: 0 10*3/uL (ref 0.0–0.5)
Eosinophils Relative: 0 %
HCT: 41.9 % (ref 36.0–46.0)
Hemoglobin: 13.6 g/dL (ref 12.0–15.0)
Immature Granulocytes: 1 %
Lymphocytes Relative: 5 %
Lymphs Abs: 0.6 10*3/uL — ABNORMAL LOW (ref 0.7–4.0)
MCH: 31.3 pg (ref 26.0–34.0)
MCHC: 32.5 g/dL (ref 30.0–36.0)
MCV: 96.5 fL (ref 80.0–100.0)
Monocytes Absolute: 0.4 10*3/uL (ref 0.1–1.0)
Monocytes Relative: 3 %
Neutro Abs: 11.6 10*3/uL — ABNORMAL HIGH (ref 1.7–7.7)
Neutrophils Relative %: 91 %
Platelets: 433 10*3/uL — ABNORMAL HIGH (ref 150–400)
RBC: 4.34 MIL/uL (ref 3.87–5.11)
RDW: 12.6 % (ref 11.5–15.5)
WBC: 12.7 10*3/uL — ABNORMAL HIGH (ref 4.0–10.5)
nRBC: 0 % (ref 0.0–0.2)

## 2022-07-22 LAB — MAGNESIUM: Magnesium: 2.3 mg/dL (ref 1.7–2.4)

## 2022-07-22 LAB — CSF CULTURE W GRAM STAIN
Culture: NO GROWTH
Gram Stain: NONE SEEN

## 2022-07-22 LAB — VDRL, CSF: VDRL Quant, CSF: NONREACTIVE

## 2022-07-22 LAB — HOMOCYSTEINE: Homocysteine: 6.1 umol/L (ref 0.0–17.2)

## 2022-07-22 LAB — CYTOLOGY - NON PAP

## 2022-07-22 NOTE — Assessment & Plan Note (Signed)
Serotonin previously discontinued. Continue melatonin at bedtime.

## 2022-07-22 NOTE — Assessment & Plan Note (Signed)
WBC normal on admission, 12.7 today in the setting of high-dose IV steroids.  No signs or symptoms of infection. -- Monitor CBC -- Monitor for signs or symptoms of infection

## 2022-07-22 NOTE — Progress Notes (Signed)
Progress Note   Patient: Yvonne Pitts YIF:[id number] DOB: October 31, 1956 DOA: 07/18/2022     4 DOS: the patient was seen and examined on 07/22/2022   Brief hospital course: Ms. Lengacher is a 66 yo female with PMH depression, COPD, ongoing tobacco use, DDD, polycythemia who presented initially to the ER on 07/16/2022 with odd behavior. At that time she had reported that she had been on 2 courses of antibiotics outpatient for sinusitis and UTI.  Most recent course of Bactrim was completed approximately 10 days prior but they do not recall the first antibiotic.  She was in [place] when she was prescribed these.  Family states that her behavior changes began shortly after all of this.   Other medication changes include addition of Seroquel at night to help with sleep and discontinuation of Celexa without taper.  This change took place on 07/07/2022 however her behavior changes were already taking place prior to this medication change. She underwent work-up in the ER on 07/16/2022 which included CXR, CT angio head/neck, MRI brain without contrast followed by MRI brain with contrast. UDS was notable for TCA which she is not prescribed however can be seen with false positives when taking seroquel and/or flexeril (both of which were filled recently).  VBG unremarkable.  TSH normal.  UA negative for infection. Repeat UDS on 07/18/2022 was negative for presence of TCA at that time.  She was sent home from the ER on 07/16/2022 due to negative work-up and no obvious etiology for her behavior change. She was then brought back to the hospital on 07/18/2022 due to ongoing abnormal behavior.  At that point she underwent evaluation by neurology and psychiatry.  She also underwent a lumbar puncture.  After evaluation by neurology she is being trialed on high dose steroids due to concern for possible autoimmune encephalitis while further labs return.   Assessment and Plan: * Abnormal behavior Unclear etiology at  this time and odd presentation.  Family describe that she has been acting very abnormal, increased irritability, poor sleep, stuttering at times, difficulty with concentration, difficulty forming thought processes. Extensive workup underway; so far negative thus far but still pending tests --follow up M/E panel from CSF and other pending tests including autoimmune Caffie Pinto --Neurology following --Also seen by by psychiatry as well with "no obvious psychiatric conditions".  She was not felt to be psychotic or depressed per psychiatry assessment. --On day 3 of 5 empiric high dose IV steroids per neurology initiated on 07/20/22 given some concern for ?autoimmune encephalitis --Neurology considering IVIG depending on response to steroids  Depression Per Dr. Sabino Gasser: - Patient has historically been on Celexa.  She is unable to definitively state how consistent she is with taking it; her daughter thinks that she misses doses here and there as sometimes she takes it "when needed" -On 07/07/2022, she was discontinued from [place] and started on Seroquel to help with sleep at night but family has not noticed any significant benefit at this time - Discussed with patient and family in the ER.  For now to avoid any significant SSRI withdrawal, agree on placing back on Celexa at this time and encouraging her to take it consistently.  Discontinue Seroquel  Tobacco abuse Patient endorsed smoking approximately 2 PPD.  Recently due to increased level of stress, was reportedly up to 3 PPD. - Patient strongly encouraged to cut back and try to quit given underlying emphysematous lung changes; she also has several small stable nodules noted on lung cancer screening  CT - Continue nicotine patch  Thrombocytosis-resolved as of 07/19/2022 - Presumed reactive - Platelets have normalized  - Continue intermittent monitoring  Leukocytosis WBC normal on admission, 12.7 today in the setting of high-dose IV steroids.  No signs  or symptoms of infection. -- Monitor CBC -- Monitor for signs or symptoms of infection  Insomnia Serotonin previously discontinued. Continue melatonin at bedtime.  Essential hypertension - Continue amlodipine  GERD (gastroesophageal reflux disease) - Continue Protonix  COPD, moderate (HCC) Stable without signs of exacerbation. - Continue albuterol and Dulera        Subjective: Patient awake seated edge of bed when seen on rounds today.  She is in very good spirits and talkative.  She reports feeling much better than she did at the time of admission.  She states feeling close to her normal self.  Physical Exam: Vitals:   07/21/22 0901 07/21/22 1541 07/21/22 2005 07/22/22 0505  BP: 125/61 (!) 131/46 138/69 138/75  Pulse: 82 67 71 63  Resp: '17 18 18 16  '$ Temp: 98.6 F (37 C) 98.2 F (36.8 C) 97.7 F (36.5 C) (!) 97.4 F (36.3 C)  TempSrc:      SpO2: 94% 96% 100% 99%  Weight:      Height:       General exam: awake, alert, no acute distress HEENT: atraumatic, clear conjunctiva, anicteric sclera, moist mucus membranes, hearing grossly normal  Respiratory system: CTA, no wheezes, rales or rhonchi, normal respiratory effort. Cardiovascular system: normal S1/S2, RRR, no pedal edema.   Gastrointestinal system: soft, nondistended abdomen Central nervous system: A&O x3. no gross focal neurologic deficits, normal speech Extremities: moves all, no edema, normal tone Skin: dry, intact, normal temperature Psychiatry: normal mood, congruent affect, judgment and insight appear somewhat abnormal   Data Reviewed:  Notable labs: Glucose 122, WBC 12.7 up from 6.4, platelets 433  Family Communication: None at bedside, will attempt to call  Disposition: Status is: Inpatient Remains inpatient appropriate because: Remains on high-dose IV steroids per neurology, day 3 of 5-day course   Planned Discharge Destination: Home    Time spent: 40 minutes  Author: Ezekiel Slocumb,  DO 07/22/2022 1:25 PM  For on call review [email or web address].

## 2022-07-22 NOTE — Progress Notes (Signed)
S:  Seen and examined No acute changes Walking with a walker on the unit.   Data  CSF WBC 2, RBC 0 Protein 43 Glucose 62 Pending: VDRL, culture, OCB, send out to [facility] autoimmune encephalopathy panel on CSF Meningitis PCR panel and cytology were not run 2/2 WBC 2  Ammonia, RPR WNL B12 344 MMA, homocysteine, send out to [facility] clinic ENS2 autoimmune encephalopathy panel on serum is pending  CT c/a/p - no e/o malignancy CTA H&N - no e/o vasculitis rEEG - normal  O:  Vitals:   07/21/22 2005 07/22/22 0505  BP: 138/69 138/75  Pulse: 71 63  Resp: 18 16  Temp: 97.7 F (36.5 C) (!) 97.4 F (36.3 C)  SpO2: 100% 99%   Physical Exam Gen: A&O x4, NAD HEENT: Atraumatic, normocephalic;mucous membranes moist; oropharynx clear, tongue without atrophy or fasciculations. Neck: Supple, trachea midline. Resp: CTAB, no w/r/r CV: RRR, no m/g/r; nml S1 and S2. 2+ symmetric peripheral pulses. Abd: soft/NT/ND; nabs x 4 quad Extrem: Nml bulk; no cyanosis, clubbing, or edema.   Neuro: *MS: A&O x4. Follows simple commands but not multi-step. Able to calculate change. Poor attention. Registration 3/3. Delayed recall 0/3 (does state 3 objects, but not the 3 examiner asked her to remember) Somewhat inappropriate material in her speech (disinhibited) *Speech: fluid, nondysarthric, able to name and repeat *CN:    I: Deferred   II,III: PERRLA, VFF by confrontation, optic discs unable to be visualized 2/2 pupillary constriction   III,IV,VI: EOMI w/o nystagmus, no ptosis   V: Sensation intact from V1 to V3 to LT   VII: Eyelid closure was full.  Smile symmetric.   VIII: Hearing intact to voice   IX,X: Voice normal, palate elevates symmetrically    XI: SCM/trap 5/5 bilat   XII: Tongue protrudes midline, no atrophy or fasciculations    *Motor:   Normal bulk.  No tremor, rigidity or bradykinesia. No pronator drift.     Strength: Dlt Bic Tri WrE WrF FgS Gr HF KnF KnE PlF DoF    Left '5 5  5 5 5 5 5 5 5 5 5 5    '$ Right '5 5 5 5 5 5 5 5 5 5 5 5    '$ *Sensory: Intact to light touch, pinprick, temperature vibration throughout. Symmetric. Propioception intact bilat.  No double-simultaneous extinction.  *Coordination:  Dysmetria on bilateral FNF without frank ataxia *Reflexes:  2+ brisk and symmetric throughout without clonus; toes w/d bilat *Gait: seen ambulating in the hall with a walker, gait is slow with small steps but no ataxia or shuffling  Unchanged exam from yesterday  A/P:  66 yo woman admitted with rapidly progressive cognitive decline with extensive psychiatric overlay over the past 3 weeks. Prior to that she was cognitively normal without any neurologic deficits. The rapidity of her progression is concerning and she is admitted for expedited workup. CMP and UA are wholly unremarkable. CBC notable for only a mild thrombocytosis, likely reactive. TSH, RPR, ammonia, WNL. B12 borderline at 344.   The MRI brain shows abnormal T2/FLAIR hyperintensity diffusely (symmetric) in the pons and part of the medulla. There is no abnormal contrast enhancement. This in combination with her rapidly progressive cognitive decline and acute psychiatric disturbance can be seen in the setting of rhombencephalitis. Etiologies can be paraneoplastic (particularly given patient's extensive hx tobacco abuse) or infectious. CSF showed WBC 2, not c/f infection. Given her extensive smoking hx and concern for potential paraneoplastic syndrome however CT c/a/p showed  no e/o malignancy at this time. CTA H&N showed no e/o vasculitis. rEEG WNL.    Radiographic ddx for MRI abnormalities include osmotic demyelination syndrome (no history c/w this) and chronic small vessel ischemic disease (unlikely in this patient with very little burden CSVID overall in bilateral cerebral hemispheres).   Plan for 5 days high dose steroids empiric tx for autoimmune encephalitis. Antibody testing panels will take approximately 3 weeks  to result and treatment should not be delayed waiting for these.   Neuro-Behcets can present with similar parenchymal lesions but husband is unaware of patient having any history of oral or genital ulcers. There is no pathognomonic test for Behcets.   - F/u outstanding CSF labs and ENC2 autoimmune encephalopathy panel send out to [facility] clinic - F/u MMA, homocysteine eval for B12 deficiency (B12 level borderline at 344)-replete  - F/u ENS2 serum encephalopathy panel send out to [facility] clinic - Continue '1000mg'$  solumedrol IV daily x5 days end date 7/27 - May need to consider IVIG if steroids are not effective Will continue to follow. Discussed with Dr. Arbutus Ped.  -- Yvonne Portland, MD Neurologist [facility] Pager: [phone]

## 2022-07-23 ENCOUNTER — Other Ambulatory Visit: Payer: Medicare HMO

## 2022-07-23 DIAGNOSIS — R4689 Other symptoms and signs involving appearance and behavior: Secondary | ICD-10-CM | POA: Diagnosis not present

## 2022-07-23 LAB — OLIGOCLONAL BANDS, CSF + SERM

## 2022-07-23 LAB — CBC WITH DIFFERENTIAL/PLATELET
Abs Immature Granulocytes: 0.08 10*3/uL — ABNORMAL HIGH (ref 0.00–0.07)
Basophils Absolute: 0 10*3/uL (ref 0.0–0.1)
Basophils Relative: 0 %
Eosinophils Absolute: 0 10*3/uL (ref 0.0–0.5)
Eosinophils Relative: 0 %
HCT: 42.1 % (ref 36.0–46.0)
Hemoglobin: 13.9 g/dL (ref 12.0–15.0)
Immature Granulocytes: 1 %
Lymphocytes Relative: 6 %
Lymphs Abs: 0.7 10*3/uL (ref 0.7–4.0)
MCH: 31.6 pg (ref 26.0–34.0)
MCHC: 33 g/dL (ref 30.0–36.0)
MCV: 95.7 fL (ref 80.0–100.0)
Monocytes Absolute: 0.5 10*3/uL (ref 0.1–1.0)
Monocytes Relative: 4 %
Neutro Abs: 9.9 10*3/uL — ABNORMAL HIGH (ref 1.7–7.7)
Neutrophils Relative %: 89 %
Platelets: 392 10*3/uL (ref 150–400)
RBC: 4.4 MIL/uL (ref 3.87–5.11)
RDW: 12.6 % (ref 11.5–15.5)
WBC: 11.2 10*3/uL — ABNORMAL HIGH (ref 4.0–10.5)
nRBC: 0 % (ref 0.0–0.2)

## 2022-07-23 MED ORDER — SODIUM CHLORIDE 0.9 % IV SOLN: INTRAVENOUS | Status: DC | PRN

## 2022-07-23 NOTE — Plan of Care (Signed)
  Problem: Clinical Measurements: Goal: Cardiovascular complication will be avoided Outcome: Progressing   Problem: Activity: Goal: Risk for activity intolerance will decrease Outcome: Progressing   Problem: Nutrition: Goal: Adequate nutrition will be maintained Outcome: Progressing   Problem: Elimination: Goal: Will not experience complications related to bowel motility Outcome: Progressing Goal: Will not experience complications related to urinary retention Outcome: Progressing   Problem: Safety: Goal: Ability to remain free from injury will improve Outcome: Progressing

## 2022-07-23 NOTE — Care Management Important Message (Signed)
Important Message  Patient Details  Name: Yvonne Pitts MRN: [id number] Date of Birth: 1956-11-07   Medicare Important Message Given:  Yes     Juliann Pulse A Yehya Brendle 07/23/2022, 2:31 PM

## 2022-07-23 NOTE — TOC Initial Note (Signed)
Transition of Care [facility]) - Initial/Assessment Note    Patient Details  Name: Yvonne Pitts MRN: [id number] Date of Birth: [birth year]  Transition of Care [facility]) CM/SW Contact:    Pete Pelt, RN Phone Number: 07/23/2022, 10:28 AM  Clinical Narrative:    Patient lives at home with spouse.  Spouse provides care, and patient states "he does a good job".  There are no concerns about returning home at discharge, no concerns with medications or transportation at this time.  Patient is seen ambulating independently on the unit during her stay.  As per patient and family, there are no TOC needs at this time.  TOC contact information provided in the event needs do arise.               Expected Discharge Plan: Home/Self Care Barriers to Discharge: Continued Medical Work up   Patient Goals and CMS Choice        Expected Discharge Plan and Services Expected Discharge Plan: Home/Self Care                                              Prior Living Arrangements/Services     Patient language and need for interpreter reviewed:: Yes Do you feel safe going back to the place where you live?: Yes      Need for Family Participation in Patient Care: Yes (Comment) Care giver support system in place?: Yes (comment)   Criminal Activity/Legal Involvement Pertinent to Current Situation/Hospitalization: No - Comment as needed  Activities of Daily Living Home Assistive Devices/Equipment: None ADL Screening (condition at time of admission) Patient's cognitive ability adequate to safely complete daily activities?: Yes Is the patient deaf or have difficulty hearing?: No Does the patient have difficulty seeing, even when wearing glasses/contacts?: No Does the patient have difficulty concentrating, remembering, or making decisions?: Yes Patient able to express need for assistance with ADLs?: Yes Does the patient have difficulty dressing or bathing?: No Independently performs ADLs?: Yes  (appropriate for developmental age) Does the patient have difficulty walking or climbing stairs?: No Weakness of Legs: Both Weakness of Arms/Hands: None  Permission Sought/Granted Permission sought to share information with : Case Manager Permission granted to share information with : Yes, Verbal Permission Granted              Emotional Assessment Appearance:: Appears stated age Attitude/Demeanor/Rapport: Gracious, Engaged Affect (typically observed): Pleasant, Appropriate Orientation: : Oriented to Self, Oriented to Place, Oriented to Situation Alcohol / Substance Use: Not Applicable Psych Involvement: No (comment)  Admission diagnosis:  Altered mental status [R41.82] Altered mental status, unspecified altered mental status type [R41.82] Patient Active Problem List   Diagnosis Date Noted   Leukocytosis 07/22/2022   Depression 07/19/2022   Tobacco abuse 07/19/2022   Abnormal behavior 07/18/2022   GERD (gastroesophageal reflux disease) 07/18/2022   Essential hypertension 07/18/2022   Insomnia 07/18/2022   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 [address] Phone: [phone] Fax: [phone]  [id number] [place], [place] [id number]: [phone]: [phone] [facility] [address] [id number]: [phone]: [phone]    Social Determinants of Health (SDOH) Interventions    Readmission Risk  Interventions     No data to display

## 2022-07-23 NOTE — Progress Notes (Signed)
Progress Note   Patient: Yvonne Pitts YKD:[id number] DOB: 05-17-1956 DOA: 07/18/2022     5 DOS: the patient was seen and examined on 07/23/2022   Brief hospital course: Yvonne Pitts is a 66 yo female with PMH depression, COPD, ongoing tobacco use, DDD, polycythemia who presented initially to the ER on 07/16/2022 with odd behavior. At that time she had reported that she had been on 2 courses of antibiotics outpatient for sinusitis and UTI.  Most recent course of Bactrim was completed approximately 10 days prior but they do not recall the first antibiotic.  She was in [place] when she was prescribed these.  Family states that her behavior changes began shortly after all of this.   Other medication changes include addition of Seroquel at night to help with sleep and discontinuation of Celexa without taper.  This change took place on 07/07/2022 however her behavior changes were already taking place prior to this medication change. She underwent work-up in the ER on 07/16/2022 which included CXR, CT angio head/neck, MRI brain without contrast followed by MRI brain with contrast. UDS was notable for TCA which she is not prescribed however can be seen with false positives when taking seroquel and/or flexeril (both of which were filled recently).  VBG unremarkable.  TSH normal.  UA negative for infection. Repeat UDS on 07/18/2022 was negative for presence of TCA at that time.  She was sent home from the ER on 07/16/2022 due to negative work-up and no obvious etiology for her behavior change. She was then brought back to the hospital on 07/18/2022 due to ongoing abnormal behavior.  At that point she underwent evaluation by neurology and psychiatry.  She also underwent a lumbar puncture.  After evaluation by neurology she is being trialed on high dose steroids due to concern for possible autoimmune encephalitis while further labs return.   Assessment and Plan: * Abnormal behavior Unclear etiology at  this time and odd presentation.  Family describe that she has been acting very abnormal, increased irritability, poor sleep, stuttering at times, difficulty with concentration, difficulty forming thought processes. Extensive workup underway; so far negative thus far but still pending tests --follow up M/E panel from CSF and other pending tests including autoimmune Yvonne Pitts --Neurology following --Also seen by by psychiatry as well with "no obvious psychiatric conditions".  She was not felt to be psychotic or depressed per psychiatry assessment. --On day 4 of 5 empiric high dose IV steroids per neurology initiated on 07/20/22 given some concern for ?autoimmune encephalitis --Neurology considering IVIG depending on response to steroids  Depression Per Dr. Sabino Gasser: - Patient has historically been on Celexa.  She is unable to definitively state how consistent she is with taking it; her daughter thinks that she misses doses here and there as sometimes she takes it "when needed" -On 07/07/2022, she was discontinued from [place] and started on Seroquel to help with sleep at night but family has not noticed any significant benefit at this time - Discussed with patient and family in the ER.  For now to avoid any significant SSRI withdrawal, agree on placing back on Celexa at this time and encouraging her to take it consistently.  Discontinue Seroquel  Tobacco abuse Patient endorsed smoking approximately 2 PPD.  Recently due to increased level of stress, was reportedly up to 3 PPD. - Patient strongly encouraged to cut back and try to quit given underlying emphysematous lung changes; she also has several small stable nodules noted on lung cancer screening  CT - Continue nicotine patch  Thrombocytosis-resolved as of 07/19/2022 - Presumed reactive - Platelets have normalized  - Continue intermittent monitoring  Leukocytosis WBC normal on admission, 12.7 today in the setting of high-dose IV steroids.  No signs  or symptoms of infection. -- Monitor CBC -- Monitor for signs or symptoms of infection  Insomnia Serotonin previously discontinued. Continue melatonin at bedtime.  Essential hypertension - Continue amlodipine  GERD (gastroesophageal reflux disease) - Continue Protonix  COPD, moderate (HCC) Stable without signs of exacerbation. - Continue albuterol and Dulera        Subjective: Patient seated edge of bed when seen on rounds, son and husband are at bedside.  She reports feeling great.  She feels like she is back to her normal self.  Husband and son continue to report that she is improved but not near her baseline and remains far more talkative and overly cheerful than normal.  Patient denies any acute complaints and no acute events reported.  Physical Exam: Vitals:   07/22/22 1734 07/22/22 2013 07/23/22 0453 07/23/22 0806  BP: 138/66 138/69 (!) 139/95 (!) 141/63  Pulse: 70 64 73 61  Resp: '16 18 18 17  '$ Temp: 98.2 F (36.8 C) (!) 97.4 F (36.3 C) 97.9 F (36.6 C) 97.6 F (36.4 C)  TempSrc:      SpO2: 98% 95% 98% 97%  Weight:      Height:       General exam: awake, alert, no acute distress HEENT: moist mucus membranes, hearing grossly normal  Respiratory system: CTA, no wheezes, rales or rhonchi, normal respiratory effort. Cardiovascular system: normal S1/S2, RRR, no pedal edema.   Gastrointestinal system: soft, nondistended abdomen Central nervous system: A&O x3. no gross focal neurologic deficits, normal speech Extremities: moves all, no edema, normal tone Skin: dry, intact, normal temperature Psychiatry: very cheerful mood, congruent affect, judgment and insight appear abnormal   Data Reviewed:  Notable labs: WBC 11.2 from 12.7    Family Communication: Husband and son at bedside on rounds  Disposition: Status is: Inpatient Remains inpatient appropriate because: Remains on high-dose IV steroids per neurology, day 4 of 5-day course   Planned Discharge  Destination: Home    Time spent: 35 minutes  Author: Ezekiel Slocumb, DO 07/23/2022 1:05 PM  For on call review [email or web address].

## 2022-07-24 ENCOUNTER — Encounter: Payer: Self-pay | Admitting: Internal Medicine

## 2022-07-24 DIAGNOSIS — R4689 Other symptoms and signs involving appearance and behavior: Secondary | ICD-10-CM | POA: Diagnosis not present

## 2022-07-24 LAB — CBC WITH DIFFERENTIAL/PLATELET
Abs Immature Granulocytes: 0.08 10*3/uL — ABNORMAL HIGH (ref 0.00–0.07)
Basophils Absolute: 0 10*3/uL (ref 0.0–0.1)
Basophils Relative: 0 %
Eosinophils Absolute: 0 10*3/uL (ref 0.0–0.5)
Eosinophils Relative: 0 %
HCT: 42.3 % (ref 36.0–46.0)
Hemoglobin: 13.8 g/dL (ref 12.0–15.0)
Immature Granulocytes: 1 %
Lymphocytes Relative: 11 %
Lymphs Abs: 1.2 10*3/uL (ref 0.7–4.0)
MCH: 31.6 pg (ref 26.0–34.0)
MCHC: 32.6 g/dL (ref 30.0–36.0)
MCV: 96.8 fL (ref 80.0–100.0)
Monocytes Absolute: 0.8 10*3/uL (ref 0.1–1.0)
Monocytes Relative: 7 %
Neutro Abs: 9 10*3/uL — ABNORMAL HIGH (ref 1.7–7.7)
Neutrophils Relative %: 81 %
Platelets: 398 10*3/uL (ref 150–400)
RBC: 4.37 MIL/uL (ref 3.87–5.11)
RDW: 12.5 % (ref 11.5–15.5)
WBC: 11 10*3/uL — ABNORMAL HIGH (ref 4.0–10.5)
nRBC: 0 % (ref 0.0–0.2)

## 2022-07-24 LAB — METHYLMALONIC ACID, SERUM: Methylmalonic Acid, Quantitative: 114 nmol/L (ref 0–378)

## 2022-07-24 MED ORDER — CYANOCOBALAMIN 1000 MCG PO TABS: 1000.0000 ug | ORAL_TABLET | Freq: Every day | ORAL | 3 refills | Status: AC

## 2022-07-24 NOTE — TOC Progression Note (Signed)
Transition of Care [facility]) - Progression Note    Patient Details  Name: Yvonne Pitts MRN: [id number] Date of Birth: [birth year]  Transition of Care [facility]) CM/SW [place], RN Phone Number: 07/24/2022, 11:28 AM  Clinical Narrative:   RNCM spoke with patient's spouse.  Spouse was a bit tearful, stated her status is okay to discharge home without assistance, but care down the road is unknown.  RNCM provided patient with resources for Dementia and [facility] to assist in care planning.  Spouse was gracious and accepting.    Expected Discharge Plan: Home/Self Care Barriers to Discharge: Continued Medical Work up  Expected Discharge Plan and Services Expected Discharge Plan: Home/Self Care                                               Social Determinants of Health (SDOH) Interventions    Readmission Risk Interventions     No data to display

## 2022-07-24 NOTE — Progress Notes (Signed)
Mobility Specialist - Progress Note   07/24/22 1400  Mobility  Activity Ambulated independently in hallway  Level of [place] wheel walker  Distance Ambulated (ft) 320 ft  Activity Response Tolerated well  $Mobility charge 1 Mobility     Pt ambulated in hallway indep, voices no complaints.  Merrily Brittle Mobility Specialist 07/24/22, 2:39 PM

## 2022-07-24 NOTE — Discharge Summary (Signed)
Physician Discharge Summary   Patient: Yvonne Pitts MRN: [id number] DOB: 03/25/1956  Admit date:     07/18/2022  Discharge date: 07/24/2022  Discharge Physician: Ezekiel Slocumb   PCP: Tracie Harrier, MD   Recommendations at discharge:   Follow-up with neurology as scheduled next week Follow-up pending CSF labs, autoimmune encephalopathy panel sent to [facility], MMA, homocystine Follow-up on improvement of significant mood elevation and excessive talking (manic symptoms) in the coming weeks after empiric IV steroids.  Consider initiation of mood stabilizing agent if these symptoms persist. Follow-up with primary care in 1 to 2 weeks  Discharge Diagnoses: Principal Problem:   Abnormal behavior Active Problems:   Depression   Tobacco abuse   COPD, moderate (HCC)   GERD (gastroesophageal reflux disease)   Essential hypertension   Insomnia   Leukocytosis  Resolved Problems:   Thrombocytosis  Hospital Course: Yvonne Pitts is a 66 yo female with PMH depression, COPD, ongoing tobacco use, DDD, polycythemia who presented initially to the ER on 07/16/2022 with odd behavior. At that time she had reported that she had been on 2 courses of antibiotics outpatient for sinusitis and UTI.  Most recent course of Bactrim was completed approximately 10 days prior but they do not recall the first antibiotic.  She was in [place] when she was prescribed these.  Family states that her behavior changes began shortly after all of this.   Other medication changes include addition of Seroquel at night to help with sleep and discontinuation of Celexa without taper.  This change took place on 07/07/2022 however her behavior changes were already taking place prior to this medication change. She underwent work-up in the ER on 07/16/2022 which included CXR, CT angio head/neck, MRI brain without contrast followed by MRI brain with contrast. UDS was notable for TCA which she is not prescribed however  can be seen with false positives when taking seroquel and/or flexeril (both of which were filled recently).  VBG unremarkable.  TSH normal.  UA negative for infection. Repeat UDS on 07/18/2022 was negative for presence of TCA at that time.  She was sent home from the ER on 07/16/2022 due to negative work-up and no obvious etiology for her behavior change. She was then brought back to the hospital on 07/18/2022 due to ongoing abnormal behavior.  At that point she underwent evaluation by neurology and psychiatry.  She also underwent a lumbar puncture.  After evaluation by neurology she is being trialed on high dose steroids due to concern for possible autoimmune encephalitis while further labs return.   7/28: Patient's manic-like symptoms of excessive talkativeness, elevated mood and insomnia continue.  Updated psychiatry and requested consideration of mood stabilizing medication prior to discharge, given these symptoms now present for a few weeks and unchanged (not worsened) after empiric IV steroids for possible autoimmune process.   She is otherwise medically stable for discharge home today.  She has close outpatient neurology follow up next week.  Encouraged they discuss mood stabilizing medication at that visit if manic symptoms persist.  Assessment and Plan: * Abnormal behavior Unclear etiology at this time and odd presentation.  Family describe that she has been acting very abnormal, increased irritability, poor sleep, stuttering at times, difficulty with concentration, difficulty forming thought processes. Extensive workup underway; so far negative thus far but still pending tests --follow up M/E panel from CSF and other pending tests including autoimmune Caffie Pinto --Neurology following --Also seen by by psychiatry as well with "no obvious psychiatric  conditions".  She was not felt to be psychotic or depressed per psychiatry assessment. --On day 4 of 5 empiric high dose IV steroids per neurology  initiated on 07/20/22 given some concern for ?autoimmune encephalitis --Neurology considering IVIG depending on response to steroids  Depression Per Dr. Sabino Gasser: - Patient has historically been on Celexa.  She is unable to definitively state how consistent she is with taking it; her daughter thinks that she misses doses here and there as sometimes she takes it "when needed" -On 07/07/2022, she was discontinued from [place] and started on Seroquel to help with sleep at night but family has not noticed any significant benefit at this time - Discussed with patient and family in the ER.  For now to avoid any significant SSRI withdrawal, agree on placing back on Celexa at this time and encouraging her to take it consistently.  Discontinue Seroquel  Tobacco abuse Patient endorsed smoking approximately 2 PPD.  Recently due to increased level of stress, was reportedly up to 3 PPD. - Patient strongly encouraged to cut back and try to quit given underlying emphysematous lung changes; she also has several small stable nodules noted on lung cancer screening CT - Continue nicotine patch  Thrombocytosis-resolved as of 07/19/2022 - Presumed reactive - Platelets have normalized  - Continue intermittent monitoring  Leukocytosis WBC normal on admission, 12.7 today in the setting of high-dose IV steroids.  No signs or symptoms of infection. -- Monitor CBC -- Monitor for signs or symptoms of infection  Insomnia Serotonin previously discontinued. Continue melatonin at bedtime.  Essential hypertension - Continue amlodipine  GERD (gastroesophageal reflux disease) - Continue Protonix  COPD, moderate ([place]) Stable without signs of exacerbation. - Continue albuterol and Dulera         Consultants: Neurology, Psychiatry Procedures performed: None  Disposition: Home Diet recommendation:  Discharge Diet Orders (From admission, onward)     Start     Ordered   07/24/22 0000  Diet - low sodium heart  healthy        07/24/22 1444           Cardiac diet DISCHARGE MEDICATION: Allergies as of 07/24/2022       Reactions   Prochlorperazine Maleate    Other reaction(s): Unknown   Ibuprofen Rash        Medication List     STOP taking these medications    alendronate 70 MG tablet Commonly known as: FOSAMAX   calcium-vitamin D 250-125 MG-UNIT tablet Commonly known as: OSCAL WITH D   sulfamethoxazole-trimethoprim 800-160 MG tablet Commonly known as: BACTRIM DS       TAKE these medications    acetaminophen 500 MG tablet Commonly known as: TYLENOL Take 500 mg by mouth daily.   Albuterol Sulfate 108 (90 Base) MCG/ACT Aepb Commonly known as: ProAir RespiClick Inhale 2 puffs into the lungs every 6 (six) hours as needed.   amLODipine 2.5 MG tablet Commonly known as: NORVASC Take 2.5 mg by mouth daily.   budesonide-formoterol 160-4.5 MCG/ACT inhaler Commonly known as: SYMBICORT INHALE 2 INHALATIONS INTO THE LUNGS 2 TIMES DAILY   citalopram 20 MG tablet Commonly known as: CELEXA Take 20 mg by mouth daily.   cyanocobalamin 1000 MCG tablet Take 1 tablet (1,000 mcg total) by mouth daily. Start taking on: July 25, 2022   cyclobenzaprine 5 MG tablet Commonly known as: FLEXERIL Take 5 mg by mouth 3 (three) times daily as needed for muscle spasms.   Flutter Devi Use as directed  melatonin 5 MG Tabs Take 1 tablet by mouth daily.   omeprazole 20 MG capsule Commonly known as: PRILOSEC Take 20 mg by mouth daily.   QUEtiapine 25 MG tablet Commonly known as: SEROQUEL Take 25 mg by mouth at bedtime.        Discharge Exam: Filed Weights   07/18/22 0825  Weight: 53.5 kg   General exam: awake, alert, no acute distress HEENT: eyes wide open, moist mucus membranes, hearing grossly normal  Respiratory system: exam limited by pt talking CTAB, no wheezes, rales or rhonchi, normal respiratory effort. Cardiovascular system: exam limited by pt talking RRR, no  pedal edema.   Central nervous system: A&O x4. no gross focal neurologic deficits, normal speech Extremities: moves all, no edema, normal tone Skin: dry, intact, normal temperature Psychiatry: elevated mood, congruent affect, excessively talkative, tangential speech content and thought processes, difficulty following commands with flight of thoughts   Condition at discharge: stable  The results of significant diagnostics from this hospitalization (including imaging, microbiology, ancillary and laboratory) are listed below for reference.   Imaging Studies: CT CHEST ABDOMEN PELVIS W CONTRAST  Result Date: 07/19/2022 CLINICAL DATA:  Altered mental status for the last 3 weeks since having a UTI. EXAM: CT CHEST, ABDOMEN, AND PELVIS WITH CONTRAST TECHNIQUE: Multidetector CT imaging of the chest, abdomen and pelvis was performed following the standard protocol during bolus administration of intravenous contrast. RADIATION DOSE REDUCTION: This exam was performed according to the departmental dose-optimization program which includes automated exposure control, adjustment of the mA and/or kV according to patient size and/or use of iterative reconstruction technique. CONTRAST:  53m OMNIPAQUE IOHEXOL 350 MG/ML SOLN COMPARISON:  CT chest, lung cancer screening, 04/07/2022. CT abdomen and pelvis, 02/05/2010. FINDINGS: CT CHEST FINDINGS Cardiovascular: Heart normal in size. No pericardial effusion. Mild left coronary artery calcifications. Great vessels are normal in caliber. Mild aortic atherosclerosis. Aortic arch branch vessels are widely patent. Mediastinum/Nodes: No enlarged mediastinal, hilar, or axillary lymph nodes. Thyroid gland, trachea, and esophagus demonstrate no significant findings. Lungs/Pleura: Several small stable nodules from the previous lung cancer screening CT. No new or suspicious nodules. Stable areas of scarring, most prominent in the anterior right upper and middle lobes adjacent to the  minor fissure. 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 Stable centrilobular emphysema. No lung consolidation. No evidence of pulmonary edema. No pleural effusion or pneumothorax. Musculoskeletal: No acute fracture or acute finding. No bone lesion. No chest wall mass. CT ABDOMEN PELVIS FINDINGS Hepatobiliary: No focal liver abnormality is seen. No gallstones, gallbladder wall thickening, or biliary dilatation. Pancreas: Unremarkable. No pancreatic ductal dilatation or surrounding inflammatory changes. Spleen: Normal in size without focal abnormality. Adrenals/Urinary Tract: No adrenal masses. Kidneys normal in size with symmetric enhancement and excretion. 6 mm low-attenuation mass from the upper pole the right kidney consistent with a cyst. No other renal masses, no stones and no hydronephrosis. Ureters are normal in course and in caliber. Normal bladder. Stomach/Bowel: Normal stomach. Small bowel and colon are normal in caliber. No wall thickening. No inflammation. No evidence of appendicitis. Vascular/Lymphatic: Aortic atherosclerosis. No dissection. No enlarged lymph nodes. Reproductive: Uterus and bilateral adnexa are unremarkable. Other: No abdominal wall hernia or abnormality. No abdominopelvic ascites. Musculoskeletal: No fracture or acute finding. No osteoblastic or osteolytic lesions. IMPRESSION: 1. No acute findings within the chest, abdomen or pelvis. 2. No evidence of malignancy. 3. Several small lung nodules without change from the prior CT chest, lung cancer  screening exam. Please refer to that study for recommendations. Electronically Signed   By: Lajean Manes M.D.   On: 07/19/2022 11:01   CT ANGIO HEAD W OR WO CONTRAST  Result Date: 07/19/2022 CLINICAL DATA:  CNS vasculitis suspected EXAM: CT ANGIOGRAPHY HEAD TECHNIQUE: Multidetector CT imaging of the head was performed using the standard protocol during bolus administration of  intravenous contrast. Multiplanar CT image reconstructions and MIPs were obtained to evaluate the vascular anatomy. RADIATION DOSE REDUCTION: This exam was performed according to the departmental dose-optimization program which includes automated exposure control, adjustment of the mA and/or kV according to patient size and/or use of iterative reconstruction technique. CONTRAST:  58m OMNIPAQUE IOHEXOL 350 MG/ML SOLN COMPARISON:  Brain MRI 07/16/2022 FINDINGS: CT HEAD Brain: No evidence of acute infarction, hemorrhage, hydrocephalus, extra-axial collection or mass lesion/mass effect. 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 Brain volume is normal. Pons was highlighted on prior brain MRIs and is unremarkable by CT. Vascular: No hyperdense vessel or unexpected calcification. Skull: Normal. Negative for fracture or focal lesion. Sinuses: No acute or significant finding. CTA HEAD Anterior circulation: Mild atheromatous calcification on the carotid siphons. Posterior outpouching from the supraclinoid right ICA measuring 2 mm, with small emanating vessel suggested on source images. No branch occlusion or flow limiting stenosis. Hypoplastic left A1 segment. Posterior circulation: The vertebral and basilar arteries are smoothly contoured and widely patent. No branch occlusion, beading, or aneurysm. Hypoplastic left P1 segment. Venous sinuses: Unremarkable for the arterial phase Anatomic variants: Negative Review of the MIP images confirms the above findings. IMPRESSION: 1. Stable from CTA 3 days ago. No emergent finding or detected vasculitis. 2. 2 mm right supraclinoid ICA outpouching again favored to reflect infundibulum rather than aneurysm. Electronically Signed   By: JJorje GuildM.D.   On: 07/19/2022 10:57   DG FL GUIDED LUMBAR PUNCTURE  Result Date: 07/18/2022 CLINICAL DATA:  Patient presented to the ED with altered mental status and there is concern for possible meningitis. Radiology request  for diagnostic lumbar puncture. EXAM: DIAGNOSTIC LUMBAR PUNCTURE UNDER FLUOROSCOPIC GUIDANCE COMPARISON:  None Available. FLUOROSCOPY: Radiation Exposure Index (as provided by the fluoroscopic device): 13.8 mGy Kerma PROCEDURE: Informed consent was obtained from the patient's husband prior to the procedure, including potential complications of headache, allergy, and pain. With the patient prone, the lower back was prepped with Betadine. 1% Lidocaine was used for local anesthesia. Lumbar puncture was performed at the L3-L4 level using a 20 gauge spinal needle with return of clear CSF with an opening pressure of 29 cm water. 15 ml of CSF were obtained for laboratory studies. Closing pressure of 15 cm water. The patient tolerated the procedure well and there were no apparent complications. IMPRESSION: Technically successful L3-L4 lumbar puncture yielding 15 mL of clear CSF for laboratory studies. Perfromed by JSoyla Dryer NP and interpreted by HKathreen Devoid MD Electronically Signed   By: HKathreen DevoidM.D.   On: 07/18/2022 16:04   EEG adult  Result Date: 07/18/2022 SDerek Jack MD     07/21/2022  8:03 AM Routine EEG Report LAntia RahalPAngusis a 66y.o. female with a history of encephalopathy who is undergoing an EEG to evaluate for seizures. Report: This EEG was acquired with electrodes placed according to the International 10-20 electrode system (including Fp1, Fp2, F3, F4, C3, C4, P3, P4, O1, O2, T3, T4, T5, T6, A1, A2, Fz, Cz, Pz). The following electrodes were missing or displaced: none. The occipital dominant rhythm was 12 Hz.  This activity is reactive to stimulation. Drowsiness was manifested by background fragmentation; deeper stages of sleep were identified by K complexes and sleep spindles. There was no focal slowing. There were no interictal epileptiform discharges. There were no electrographic seizures identified. There was no abnormal response to photic stimulation or hyperventilation.  Impression: This EEG was obtained while awake and asleep and is normal.   Clinical Correlation: Normal EEGs, however, do not rule out epilepsy. Su Monks, MD [facility] [phone] If 7pm- 7am, please page neurology on call as listed in [place].   MR BRAIN W CONTRAST  Result Date: 07/16/2022 CLINICAL DATA:  Pontine lesions EXAM: MRI HEAD WITH CONTRAST TECHNIQUE: Multiplanar, multiecho pulse sequences of the brain and surrounding structures were obtained with intravenous contrast. CONTRAST:  74m GADAVIST GADOBUTROL 1 MMOL/ML IV SOLN COMPARISON:  MRI brain without contrast 07/16/2022 FINDINGS: There is no abnormal contrast enhancement at the site of the pontine signal abnormality or anywhere else in the brain. IMPRESSION: No abnormal contrast enhancement within the pons. Electronically Signed   By: KUlyses JarredM.D.   On: 07/16/2022 22:54   MR BRAIN WO CONTRAST  Result Date: 07/16/2022 CLINICAL DATA:  Altered mental status stroke suspected EXAM: MRI HEAD WITHOUT CONTRAST TECHNIQUE: Multiplanar, multiecho pulse sequences of the brain and surrounding structures were obtained without intravenous contrast. COMPARISON:  No prior MRI, correlation is made with 07/16/2022 CTA head neck FINDINGS: Brain: No restricted diffusion to suggest acute or subacute infarct. No acute hemorrhage, mass, mass effect, or midline shift. No hemosiderin deposition to suggest remote hemorrhage. No hydrocephalus or extra-axial collection. Minimal T2 hyperintense signal in the periventricular white matter decreased T1 and increased T2 signal in the central pons (series 16 image 58 and series 15, image 16). Vascular: Normal arterial flow voids. Skull and upper cervical spine: Normal marrow signal. Sinuses/Orbits: No acute finding. Other: Trace fluid in the right mastoid air cells. IMPRESSION: 1. Decreased T1 and increased T2 signal in the central pons without diffusion restriction, which is nonspecific and could represent  the sequela of small vessel ischemic disease although few other T2 hyperintense foci are seen in the periventricular white matter. This could also be seen in the setting of central pontine myelinolysis. 2. No evidence of acute or subacute infarct. No additional acute intracranial process. Electronically Signed   By: AMerilyn BabaM.D.   On: 07/16/2022 18:34   CT ANGIO HEAD NECK W WO CM  Result Date: 07/16/2022 CLINICAL DATA:  Stuttering, difficulty speaking EXAM: CT ANGIOGRAPHY HEAD AND NECK TECHNIQUE: Multidetector CT imaging of the head and neck was performed using the standard protocol during bolus administration of intravenous contrast. Multiplanar CT image reconstructions and MIPs were obtained to evaluate the vascular anatomy. Carotid stenosis measurements (when applicable) are obtained utilizing NASCET criteria, using the distal internal carotid diameter as the denominator. RADIATION DOSE REDUCTION: This exam was performed according to the departmental dose-optimization program which includes automated exposure control, adjustment of the mA and/or kV according to patient size and/or use of iterative reconstruction technique. CONTRAST:  749mOMNIPAQUE IOHEXOL 350 MG/ML SOLN COMPARISON:  10/14/2010 CT head, no prior CTA FINDINGS: CT HEAD FINDINGS Brain: No evidence of acute infarct, hemorrhage, mass, mass effect, or midline shift. No hydrocephalus or extra-axial fluid collection. Vascular: No hyperdense vessel. Skull: Normal. Negative for fracture or focal lesion. Sinuses/Orbits: No acute finding. Other: The mastoid air cells are well aerated. CTA NECK FINDINGS Aortic arch: Standard branching. Imaged portion shows no evidence of aneurysm or dissection. No  significant stenosis of the major arch vessel origins. Mild aortic atherosclerosis. Right carotid system: No evidence of dissection, occlusion, or hemodynamically significant stenosis (greater than 50%). Atherosclerotic disease at the bifurcation and in  the proximal ICA is not hemodynamically significant. Left carotid system: No evidence of dissection, occlusion, or hemodynamically significant stenosis (greater than 50%). Vertebral arteries: Right dominant system. No evidence of dissection, occlusion, or hemodynamically significant stenosis (greater than 50%). Skeleton: No acute osseous abnormality. Degenerative changes in the temporomandibular joints. Other neck: No acute finding. Upper chest: Centrilobular and paraseptal emphysema. No focal pulmonary opacity or pleural effusion. Apical pleural-parenchymal scarring. Review of the MIP images confirms the above findings CTA HEAD FINDINGS Anterior circulation: Both internal carotid arteries are patent to the termini, without significant stenosis. A small outpouching from the right ICA terminus (series 11, image 105) appears to be an infundibulum at the origin of the right posterior communicating artery, which is not otherwise visualized. A1 segments patent, although the left A1 appears hypoplastic. Normal anterior communicating artery. Anterior cerebral arteries are patent to their distal aspects. No M1 stenosis or occlusion.  MCA branches perfused and symmetric. Posterior circulation: Vertebral arteries patent to the vertebrobasilar junction without stenosis. Posterior inferior cerebellar arteries patent proximally. Basilar patent to its distal aspect. Superior cerebellar arteries patent proximally. Patent P1 segments. PCAs perfused to their distal aspects without stenosis. Left posterior communicating artery is patent. Venous sinuses: As permitted by contrast timing, patent. Anatomic variants: None significant. Review of the MIP images confirms the above findings IMPRESSION: 1.  No acute intracranial process. 2.  No intracranial large vessel occlusion or significant stenosis. 3.  No hemodynamically significant stenosis in the neck. 4. An outpouching from the right ICA terminus is favored to be an infundibulum at  the origin of an otherwise not visualized right posterior communicating artery, which may be hypoplastic or stenosed, rather than a tiny aneurysm. Electronically Signed   By: Merilyn Baba M.D.   On: 07/16/2022 17:26   DG Chest 2 View  Result Date: 07/16/2022 CLINICAL DATA:  Weakness. EXAM: CHEST - 2 VIEW COMPARISON:  December 31, 2018 FINDINGS: Calcific atherosclerotic disease and tortuosity of the aorta. Cardiomediastinal silhouette is normal. Mediastinal contours appear intact. There is no evidence of focal airspace consolidation, pleural effusion or pneumothorax. Hyperinflation of the lungs. Osseous structures are without acute abnormality. Soft tissues are grossly normal. IMPRESSION: 1. No active cardiopulmonary disease. 2. Hyperinflation of the lungs. Electronically Signed   By: Fidela Salisbury M.D.   On: 07/16/2022 16:07    Microbiology: Results for orders placed or performed during the hospital encounter of 07/18/22  CSF culture w Gram Stain     Status: None   Collection Time: 07/18/22  3:21 PM   Specimen: PATH Cytology CSF; Cerebrospinal Fluid  Result Value Ref Range Status   Specimen Description   Final    CSF Performed at [facility], [address], [place], [place] [address]    Special Requests   Final    NONE Performed at [facility], [address], [place], [place] [address]    Gram Stain   Final    NO ORGANISMS SEEN RED BLOOD CELLS PRESENT NO WBC SEEN Performed at [facility], [address], [place], [place] [address]    Culture   Final    NO GROWTH 3 DAYS Performed at [facility], [place] [address] [place],  [address]    Report Status 07/22/2022 FINAL  Final  Labs: CBC: Recent Labs  Lab 07/20/22 0452 07/21/22 0510 07/22/22 0437 07/23/22 0445 07/24/22 0633  WBC 5.7 6.4 12.7* 11.2* 11.0*  NEUTROABS 3.3 5.7 11.6* 9.9* 9.0*  HGB 13.5 14.4 13.6 13.9 13.8  HCT 40.5 44.6 41.9 42.1 42.3  MCV 96.7 96.7 96.5 95.7  96.8  PLT 363 444* 433* 392 048   Basic Metabolic Panel: Recent Labs  Lab 07/18/22 0841 07/19/22 0349 07/20/22 0452 07/21/22 0510 07/22/22 0437  NA 140 139 137 139 139  K 3.9 3.6 3.3* 4.4 4.0  CL 103 110 105 104 102  CO2 '25 24 26 27 28  '$ GLUCOSE 102* 84 119* 149* 122*  BUN <5* <5* 6* 9 13  CREATININE 0.49 0.49 0.49 0.38* 0.44  CALCIUM 9.6 8.6* 8.9 10.1 9.7  MG  --  2.2 2.0 2.2 2.3  PHOS  --  3.5  --   --   --    Liver Function Tests: Recent Labs  Lab 07/18/22 0841 07/19/22 0349 07/20/22 0452 07/21/22 0510 07/22/22 0437  AST '19 18 16 16 15  '$ ALT '14 12 11 13 10  '$ ALKPHOS 57 47 45 49 43  BILITOT 0.7 0.5 0.5 0.4 0.2*  PROT 7.6 5.9* 6.2* 7.1 6.9  ALBUMIN 4.3 3.4* 3.6 4.0 3.9   CBG: Recent Labs  Lab 07/18/22 0836  GLUCAP 107*    Discharge time spent: greater than 30 minutes.  Signed: Ezekiel Slocumb, DO [facility] 07/24/2022

## 2022-07-24 NOTE — Progress Notes (Addendum)
S:  Seen and examined No acute changes   Data  CSF WBC 2, RBC 0 Protein 43 Glucose 62 Pending: VDRL, culture, OCB, send out to [facility] clinic [facility] autoimmune encephalopathy panel on CSF Meningitis PCR panel and cytology were not run 2/2 WBC 2  Ammonia, RPR WNL B12 344 MMA, homocysteine, send out to [facility] clinic ENS2 autoimmune encephalopathy panel on serum is pending  CT c/a/p - no e/o malignancy CTA H&N - no e/o vasculitis rEEG - normal  O:  Vitals:   07/24/22 0444 07/24/22 0741  BP: 138/66 (!) 149/76  Pulse: 62 60  Resp: 16 18  Temp: 98.1 F (36.7 C) 98.2 F (36.8 C)  SpO2: 96% 97%   Physical Exam Gen: A&O x4, NAD HEENT: Atraumatic, normocephalic;mucous membranes moist; oropharynx clear, tongue without atrophy or fasciculations. Neck: Supple, trachea midline. Resp: CTAB, no w/r/r CV: RRR, no m/g/r; nml S1 and S2. 2+ symmetric peripheral pulses. Abd: soft/NT/ND; nabs x 4 quad Extrem: Nml bulk; no cyanosis, clubbing, or edema. Neurological exam Awake alert oriented x3 Rapid speech but no dysarthria or aphasia Cranial nerves II to XII intact Motor examination with symmetric strength and no drift Sensation intact light touch Coordination with no dysmetria  A/P:  66 yo woman admitted with rapidly progressive cognitive decline with extensive psychiatric overlay over the past 3 weeks. Prior to that she was cognitively normal without any neurologic deficits. The rapidity of her progression is concerning and she is admitted for expedited workup. CMP and UA are wholly unremarkable. CBC notable for only a mild thrombocytosis, likely reactive. TSH, RPR, ammonia, WNL. B12 borderline at 344.   The MRI brain shows abnormal T2/FLAIR hyperintensity diffusely (symmetric) in the pons and part of the medulla. There is no abnormal contrast enhancement. This in combination with her rapidly progressive cognitive decline and acute psychiatric disturbance can be seen in the setting of  rhombencephalitis. Etiologies can be paraneoplastic (particularly given patient's extensive hx tobacco abuse) or infectious. CSF showed WBC 2, not c/f infection. Given her extensive smoking hx and concern for potential paraneoplastic syndrome however CT c/a/p showed no e/o malignancy at this time. CTA H&N showed no e/o vasculitis. rEEG WNL.    Radiographic ddx for MRI abnormalities include osmotic demyelination syndrome (no history c/w this) and chronic small vessel ischemic disease (unlikely in this patient with very little burden CSVID overall in bilateral cerebral hemispheres).   Plan for 5 days high dose steroids empiric tx for autoimmune encephalitis. Antibody testing panels will take approximately 3 weeks to result and treatment should not be delayed waiting for these.   Neuro-Behcets can present with similar parenchymal lesions but husband is unaware of patient having any history of oral or genital ulcers. There is no pathognomonic test for Behcets.   - F/u outstanding CSF labs and ENC2 autoimmune encephalopathy panel send out to [facility] clinic - F/u MMA, homocysteine eval for B12 deficiency (B12 level borderline at 344)-replete  - F/u ENS2 serum encephalopathy panel send out to [facility] clinic -Completed 5 days of high-dose Solu-Medrol 1 g daily. -I would continue rebleeding B12 orally for a level goal of greater than 400. -At this point, also should consider repeat evaluation with psychiatry-husband reports significant depression and anxiety throughout her life and it might be that a systemic process has exacerbated that as well. -Needs outpatient follow-up of the above labs and outpatient neurology follow-up Discussed with the patient, husband at bedside. Discussed with Dr. Arbutus Ped.  Inpatient neurology will be available as needed.  --  Amie Portland, MD Neurologist [facility] Pager: [phone]

## 2022-07-24 NOTE — Plan of Care (Signed)
  Problem: Clinical Measurements: Goal: Ability to maintain clinical measurements within normal limits will improve Outcome: Progressing   Problem: Activity: Goal: Risk for activity intolerance will decrease Outcome: Progressing   Problem: Nutrition: Goal: Adequate nutrition will be maintained Outcome: Progressing   Problem: Elimination: Goal: Will not experience complications related to bowel motility Outcome: Progressing Goal: Will not experience complications related to urinary retention Outcome: Progressing   Problem: Pain Managment: Goal: General experience of comfort will improve Outcome: Progressing   Problem: Safety: Goal: Ability to remain free from injury will improve Outcome: Progressing

## 2022-07-24 NOTE — Progress Notes (Signed)
Discharge instructions reviewed with patient and husband including followup visits and new medications.  Understanding was verbalized and all questions were answered.  IV removed without complication; patient tolerated well.  Patient discharged home via wheelchair in stable condition escorted by volunteer staff.

## 2022-07-25 ENCOUNTER — Emergency Department: Payer: Medicare HMO

## 2022-07-25 ENCOUNTER — Other Ambulatory Visit: Payer: Self-pay

## 2022-07-25 ENCOUNTER — Emergency Department
Admission: EM | Admit: 2022-07-25 | Discharge: 2022-07-26 | Disposition: A | Discharge status: Home / Self Care | Payer: Medicare HMO | Attending: Emergency Medicine | Admitting: Emergency Medicine

## 2022-07-25 DIAGNOSIS — Z20822 Contact with and (suspected) exposure to covid-19: Secondary | ICD-10-CM | POA: Insufficient documentation

## 2022-07-25 DIAGNOSIS — J449 Chronic obstructive pulmonary disease, unspecified: Secondary | ICD-10-CM | POA: Diagnosis not present

## 2022-07-25 DIAGNOSIS — F1994 Other psychoactive substance use, unspecified with psychoactive substance-induced mood disorder: Secondary | ICD-10-CM | POA: Diagnosis not present

## 2022-07-25 DIAGNOSIS — R4182 Altered mental status, unspecified: Secondary | ICD-10-CM

## 2022-07-25 DIAGNOSIS — G4700 Insomnia, unspecified: Secondary | ICD-10-CM | POA: Diagnosis present

## 2022-07-25 LAB — URINALYSIS, COMPLETE (UACMP) WITH MICROSCOPIC
Bilirubin Urine: NEGATIVE
Glucose, UA: NEGATIVE mg/dL
Hgb urine dipstick: NEGATIVE
Ketones, ur: NEGATIVE mg/dL
Nitrite: NEGATIVE
Protein, ur: NEGATIVE mg/dL
Specific Gravity, Urine: 1.009 (ref 1.005–1.030)
pH: 7 (ref 5.0–8.0)

## 2022-07-25 LAB — CBC
HCT: 44 % (ref 36.0–46.0)
Hemoglobin: 15.1 g/dL — ABNORMAL HIGH (ref 12.0–15.0)
MCH: 31.9 pg (ref 26.0–34.0)
MCHC: 34.3 g/dL (ref 30.0–36.0)
MCV: 92.8 fL (ref 80.0–100.0)
Platelets: 583 10*3/uL — ABNORMAL HIGH (ref 150–400)
RBC: 4.74 MIL/uL (ref 3.87–5.11)
RDW: 12.5 % (ref 11.5–15.5)
WBC: 16.8 10*3/uL — ABNORMAL HIGH (ref 4.0–10.5)
nRBC: 0 % (ref 0.0–0.2)

## 2022-07-25 LAB — ETHANOL: Alcohol, Ethyl (B): <10 mg/dL (ref <10)

## 2022-07-25 LAB — URINE DRUG SCREEN, QUALITATIVE (ARMC ONLY)
Amphetamines, Ur Screen: NOT DETECTED
Barbiturates, Ur Screen: NOT DETECTED
Benzodiazepine, Ur Scrn: NOT DETECTED
Cannabinoid 50 Ng, Ur ~~LOC~~: NOT DETECTED
Cocaine Metabolite,Ur ~~LOC~~: NOT DETECTED
MDMA (Ecstasy)Ur Screen: NOT DETECTED
Methadone Scn, Ur: NOT DETECTED
Opiate, Ur Screen: NOT DETECTED
Phencyclidine (PCP) Ur S: NOT DETECTED
Tricyclic, Ur Screen: NOT DETECTED

## 2022-07-25 LAB — RESP PANEL BY RT-PCR (FLU A&B, COVID) ARPGX2
Influenza A by PCR: NEGATIVE
Influenza B by PCR: NEGATIVE
SARS Coronavirus 2 by RT PCR: NEGATIVE

## 2022-07-25 LAB — COMPREHENSIVE METABOLIC PANEL
ALT: 14 U/L (ref 0–44)
AST: 15 U/L (ref 15–41)
Albumin: 4.4 g/dL (ref 3.5–5.0)
Alkaline Phosphatase: 42 U/L (ref 38–126)
Anion gap: 10 (ref 5–15)
BUN: 12 mg/dL (ref 8–23)
CO2: 28 mmol/L (ref 22–32)
Calcium: 9.7 mg/dL (ref 8.9–10.3)
Chloride: 96 mmol/L — ABNORMAL LOW (ref 98–111)
Creatinine, Ser: 0.52 mg/dL (ref 0.44–1.00)
GFR, Estimated: >60 mL/min (ref >60)
Glucose, Bld: 99 mg/dL (ref 70–99)
Potassium: 3.7 mmol/L (ref 3.5–5.1)
Sodium: 134 mmol/L — ABNORMAL LOW (ref 135–145)
Total Bilirubin: 0.6 mg/dL (ref 0.3–1.2)
Total Protein: 7.4 g/dL (ref 6.5–8.1)

## 2022-07-25 LAB — PROCALCITONIN: Procalcitonin: <0.1 ng/mL

## 2022-07-25 LAB — TSH: TSH: 3.714 u[IU]/mL (ref 0.350–4.500)

## 2022-07-25 LAB — AMMONIA: Ammonia: 14 umol/L (ref 9–35)

## 2022-07-25 MED ORDER — QUETIAPINE FUMARATE 25 MG PO TABS: 25.0000 mg | ORAL_TABLET | Freq: Every day | ORAL | Status: DC

## 2022-07-25 MED ORDER — GADOBUTROL 1 MMOL/ML IV SOLN
5.0000 mL | Freq: Once | INTRAVENOUS | Status: AC | PRN
  Administered 2022-07-25: 5 mL via INTRAVENOUS

## 2022-07-25 MED ORDER — LACTATED RINGERS IV BOLUS
1000.0000 mL | Freq: Once | INTRAVENOUS | Status: AC
  Administered 2022-07-25: 1000 mL via INTRAVENOUS

## 2022-07-25 MED ORDER — ALBUTEROL SULFATE (2.5 MG/3ML) 0.083% IN NEBU: 3.0000 mL | INHALATION_SOLUTION | Freq: Four times a day (QID) | RESPIRATORY_TRACT | Status: DC | PRN

## 2022-07-25 MED ORDER — ASENAPINE MALEATE 5 MG SL SUBL
5.0000 mg | SUBLINGUAL_TABLET | Freq: Once | SUBLINGUAL | Status: AC
  Administered 2022-07-25: 5 mg via SUBLINGUAL
  Filled 2022-07-25: qty 1

## 2022-07-25 MED ORDER — MOMETASONE FURO-FORMOTEROL FUM 200-5 MCG/ACT IN AERO: 2.0000 | INHALATION_SPRAY | Freq: Two times a day (BID) | RESPIRATORY_TRACT | Status: DC

## 2022-07-25 MED ORDER — AMLODIPINE BESYLATE 5 MG PO TABS
2.5000 mg | ORAL_TABLET | Freq: Every day | ORAL | Status: DC
Start: 2022-07-25 — End: 2022-07-26
  Administered 2022-07-25 – 2022-07-26 (×2): 2.5 mg via ORAL
  Filled 2022-07-25 (×2): qty 1

## 2022-07-25 MED ORDER — VITAMIN B-12 1000 MCG PO TABS
1000.0000 ug | ORAL_TABLET | Freq: Every day | ORAL | Status: DC
  Administered 2022-07-26: 1000 ug via ORAL
  Filled 2022-07-25: qty 1

## 2022-07-25 MED ORDER — LORAZEPAM 2 MG/ML IJ SOLN
1.0000 mg | Freq: Once | INTRAMUSCULAR | Status: AC
  Administered 2022-07-25: 1 mg via INTRAVENOUS
  Filled 2022-07-25: qty 1

## 2022-07-25 MED ORDER — PANTOPRAZOLE SODIUM 40 MG PO TBEC
40.0000 mg | DELAYED_RELEASE_TABLET | Freq: Every day | ORAL | Status: DC
  Administered 2022-07-25 – 2022-07-26 (×2): 40 mg via ORAL
  Filled 2022-07-25 (×2): qty 1

## 2022-07-25 MED ORDER — ASENAPINE MALEATE 5 MG SL SUBL
5.0000 mg | SUBLINGUAL_TABLET | Freq: Once | SUBLINGUAL | Status: DC
  Filled 2022-07-25: qty 1

## 2022-07-25 NOTE — ED Triage Notes (Signed)
Pt here via [facility] with AMS. Pt was just dc from this facility yesterday afternoon, husband states pt has been altered since retuning home. Pt is rambling on arrival.  115 94% RA 122/72 20 101-cbg 99.3

## 2022-07-25 NOTE — Progress Notes (Signed)
   07/25/22 1430  Clinical Encounter Type  Visited With Patient and family together  Visit Type Follow-up;Spiritual support;Psychological support;Social support   Yvonne [place] engaged pt and family because recognized them from visit on inpt flr the other night; now in ED.  Pt was glad to see chaplain and able to recognize, but using pressured speech and incohernet. Spouse, Yvonne [place], and son, Yvonne [place] at bedside. Son is autistic.  Chaplain B offered calming, non-anxious presence and attempted to help pt feel grounded because there was an element of fear, anxiety, and uncertainty expressed amid otherwise manic-type speech.  Chaplain offered support to pt and family; hospitality to son and active listening. Spouse shared that behavior had presented for past month, prior to inpt time. Shared that there may also be some reaction to or concern about pt's brother who she recently learned has declined in SNF in [facility] but whom she has not been able to visit.  Chaplain B offered ongoing supportive presence and encouraged family's self-care and rest. Will continue to follow.

## 2022-07-25 NOTE — ED Notes (Signed)
IVC 

## 2022-07-25 NOTE — ED Notes (Signed)
IVC reassess in am

## 2022-07-25 NOTE — ED Notes (Signed)
Pt to CT

## 2022-07-25 NOTE — ED Notes (Signed)
ED provider at bedside.

## 2022-07-25 NOTE — Consult Note (Signed)
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 female patient admitted with continued altered mental status.  HPI:  Yvonne [place] is a 66 y.o. female presenting to the ED one day after being discharged due to a continued altered mental status. Patient's husband and son at bedside. The husband reports that changes in her personality first presented during the family vacation on the 4th of July. Patient was treated for a UTI with antibiotics but symptoms continued to worsen. A second antibiotic was prescribed upon returning home and a follow-up UA showed the UTI had not resolved but altered mental status and poor sleep remained. Patient was admitted to [facility] for work-up. This last Sunday patient was placed on 5-day steroid pack which was completed yesterday.  Patient was discharged yesterday; however, husband states symptoms of excessive talking, agitation, and difficulty sleeping continued after discharge yesterday. According to the husband, patient did not go to sleep until around 4 a.m. this morning and only rested a couple hours. Continues to use caffeine and nicotine.  No prior psychiatric history.  Husband reports appetite remains intact. He is agreeable for something to help her sleep and her mood, Saphris.  This should remedy many of her symptoms and will not be needed long term as she does not have a psych history, insomnia and steroids appear to be her issues, will re-evaluate in the am.  Past Psychiatric History: none  Risk to Self:  none Risk to Others:  none Prior Inpatient Therapy:  none Prior Outpatient Therapy:  none  Past Medical History:  Past Medical  History:  Diagnosis Date   COPD (chronic obstructive pulmonary disease) ([place]) 11/03/2016   DDD (degenerative disc disease), lumbar    Polycythemia    Ruptured disc, cervical    Scoliosis     Past Surgical History:  Procedure Laterality Date   BREAST EXCISIONAL BIOPSY Right    years ago   DILATION AND CURETTAGE OF UTERUS     NECK SURGERY     Family History:  Family History  Problem Relation Age of Onset   Breast cancer Maternal Aunt 31   Bone cancer Paternal Aunt    Colon cancer Maternal Aunt    Family Psychiatric History: autistic son Social History:  Social History   Substance and Sexual Activity  Alcohol Use Not Currently   Comment: seldom maybe 2-3 times a year     Social History   Substance and Sexual Activity  Drug Use No    Social History   Socioeconomic History   Marital status: Married    Spouse name: Not on file   Number of children: Not on file   Years of education: Not on file   Highest education level: Not on file  Occupational History   Not on file  Tobacco Use   Smoking status: Every Day    Packs/day: 2.00    Years: 43.00    Total pack years: 86.00    Types: Cigarettes   Smokeless tobacco: Never  Vaping Use   Vaping Use: Never used  Substance and Sexual Activity   Alcohol use: Not Currently    Comment: seldom maybe 2-3 times a year   Drug use: No  Sexual activity: Not Currently  Other Topics Concern   Not on file  Social History Narrative   Not on file   Social Determinants of Health   Financial Resource Strain: Not on file  Food Insecurity: Not on file  Transportation Needs: Not on file  Physical Activity: Not on file  Stress: Not on file  Social Connections: Not on file   Additional Social History:    Allergies:   Allergies  Allergen Reactions   Prochlorperazine Maleate     Other reaction(s): Unknown   Ibuprofen Rash    Labs:  Results for orders placed or performed during the hospital encounter of 07/25/22 (from the  past 48 hour(s))  Comprehensive metabolic panel     Status: Abnormal   Collection Time: 07/25/22 10:21 AM  Result Value Ref Range   Sodium 134 (L) 135 - 145 mmol/L   Potassium 3.7 3.5 - 5.1 mmol/L   Chloride 96 (L) 98 - 111 mmol/L   CO2 28 22 - 32 mmol/L   Glucose, Bld 99 70 - 99 mg/dL    Comment: Glucose reference range applies only to samples taken after fasting for at least 8 hours.   BUN 12 8 - 23 mg/dL   Creatinine, Ser 0.52 0.44 - 1.00 mg/dL   Calcium 9.7 8.9 - 10.3 mg/dL   Total Protein 7.4 6.5 - 8.1 g/dL   Albumin 4.4 3.5 - 5.0 g/dL   AST 15 15 - 41 U/L   ALT 14 0 - 44 U/L   Alkaline Phosphatase 42 38 - 126 U/L   Total Bilirubin 0.6 0.3 - 1.2 mg/dL   GFR, Estimated >60 >60 mL/min    Comment: (NOTE) Calculated using the CKD-EPI Creatinine Equation (2021)    Anion gap 10 5 - 15    Comment: Performed at [facility], [address] [place], [place] [address]  CBC     Status: Abnormal   Collection Time: 07/25/22 10:21 AM  Result Value Ref Range   WBC 16.8 (H) 4.0 - 10.5 K/uL   RBC 4.74 3.87 - 5.11 MIL/uL   Hemoglobin 15.1 (H) 12.0 - 15.0 g/dL   HCT 44.0 36.0 - 46.0 %   MCV 92.8 80.0 - 100.0 fL   MCH 31.9 26.0 - 34.0 pg   MCHC 34.3 30.0 - 36.0 g/dL   RDW 12.5 11.5 - 15.5 %   Platelets 583 (H) 150 - 400 K/uL   nRBC 0.0 0.0 - 0.2 %    Comment: Performed at [facility], [address] [place], [place] [address]  Ethanol     Status: None   Collection Time: 07/25/22 10:21 AM  Result Value Ref Range   Alcohol, Ethyl (B) <10 <10 mg/dL    Comment: (NOTE) Lowest detectable limit for serum alcohol is 10 mg/dL.  For medical purposes only. Performed at [facility], [address] [place], [place] [address]   Procalcitonin - Baseline     Status: None   Collection Time: 07/25/22 10:21 AM  Result Value Ref Range   Procalcitonin <0.10 ng/mL    Comment:        Interpretation: PCT (Procalcitonin) <= 0.5 ng/mL: Systemic infection (sepsis) is  not likely. Local bacterial infection is possible. (NOTE)       Sepsis PCT Algorithm           Lower Respiratory Tract  Infection PCT Algorithm    ----------------------------     ----------------------------         PCT < 0.25 ng/mL                PCT < 0.10 ng/mL          Strongly encourage             Strongly discourage   discontinuation of antibiotics    initiation of antibiotics    ----------------------------     -----------------------------       PCT 0.25 - 0.50 ng/mL            PCT 0.10 - 0.25 ng/mL               OR       >80% decrease in PCT            Discourage initiation of                                            antibiotics      Encourage discontinuation           of antibiotics    ----------------------------     -----------------------------         PCT >= 0.50 ng/mL              PCT 0.26 - 0.50 ng/mL               AND        <80% decrease in PCT             Encourage initiation of                                             antibiotics       Encourage continuation           of antibiotics    ----------------------------     -----------------------------        PCT >= 0.50 ng/mL                  PCT > 0.50 ng/mL               AND         increase in PCT                  Strongly encourage                                      initiation of antibiotics    Strongly encourage escalation           of antibiotics                                     -----------------------------                                           PCT <= 0.25 ng/mL  OR                                        > 80% decrease in PCT                                      Discontinue / Do not initiate                                             antibiotics  Performed at [facility], [address] [place], [place] [address]   Ammonia     Status: None   Collection Time: 07/25/22 10:22 AM  Result Value  Ref Range   Ammonia 14 9 - 35 umol/L    Comment: Performed at [facility], [address] [place], [place] [address]  Urinalysis, Complete w Microscopic Urine, Clean Catch     Status: Abnormal   Collection Time: 07/25/22 11:29 AM  Result Value Ref Range   Color, Urine YELLOW (A) YELLOW   APPearance CLEAR (A) CLEAR   Specific Gravity, Urine 1.009 1.005 - 1.030   pH 7.0 5.0 - 8.0   Glucose, UA NEGATIVE NEGATIVE mg/dL   Hgb urine dipstick NEGATIVE NEGATIVE   Bilirubin Urine NEGATIVE NEGATIVE   Ketones, ur NEGATIVE NEGATIVE mg/dL   Protein, ur NEGATIVE NEGATIVE mg/dL   Nitrite NEGATIVE NEGATIVE   Leukocytes,Ua TRACE (A) NEGATIVE   RBC / HPF 0-5 0 - 5 RBC/hpf   WBC, UA 0-5 0 - 5 WBC/hpf   Bacteria, UA RARE (A) NONE SEEN   Squamous Epithelial / LPF 0-5 0 - 5   Hyaline Casts, UA PRESENT     Comment: Performed at [facility], [address] [place], [place] [address]  Urine Drug Screen, Qualitative ([facility] only)     Status: None   Collection Time: 07/25/22 11:29 AM  Result Value Ref Range   Tricyclic, Ur Screen NONE DETECTED NONE DETECTED   Amphetamines, Ur Screen NONE DETECTED NONE DETECTED   MDMA (Ecstasy)Ur Screen NONE DETECTED NONE DETECTED   Cocaine Metabolite,Ur [place] NONE DETECTED NONE DETECTED   Opiate, Ur Screen NONE DETECTED NONE DETECTED   Phencyclidine (PCP) Ur S NONE DETECTED NONE DETECTED   Cannabinoid 50 Ng, Ur [place] NONE DETECTED NONE DETECTED   Barbiturates, Ur Screen NONE DETECTED NONE DETECTED   Benzodiazepine, Ur Scrn NONE DETECTED NONE DETECTED   Methadone Scn, Ur NONE DETECTED NONE DETECTED    Comment: (NOTE) Tricyclics + metabolites, urine    Cutoff 1000 ng/mL Amphetamines + metabolites, urine  Cutoff 1000 ng/mL MDMA (Ecstasy), urine              Cutoff 500 ng/mL Cocaine Metabolite, urine          Cutoff 300 ng/mL Opiate + metabolites, urine        Cutoff 300 ng/mL Phencyclidine (PCP), urine         Cutoff 25 ng/mL Cannabinoid, urine                  Cutoff 50 ng/mL Barbiturates + metabolites, urine  Cutoff 200 ng/mL Benzodiazepine, urine              Cutoff  200 ng/mL Methadone, urine                   Cutoff 300 ng/mL  The urine drug screen provides only a preliminary, unconfirmed analytical test result and should not be used for non-medical purposes. Clinical consideration and professional judgment should be applied to any positive drug screen result due to possible interfering substances. A more specific alternate chemical method must be used in order to obtain a confirmed analytical result. Gas chromatography / mass spectrometry (GC/MS) is the preferred confirm atory method. Performed at [facility], [address] [place], [place] [address]     No current facility-administered medications for this encounter.   Current Outpatient Medications  Medication Sig Dispense Refill   acetaminophen (TYLENOL) 500 MG tablet Take 500 mg by mouth daily.      Albuterol Sulfate (PROAIR RESPICLICK) 518 (90 Base) MCG/ACT AEPB Inhale 2 puffs into the lungs every 6 (six) hours as needed. 2 each 3   amLODipine (NORVASC) 2.5 MG tablet Take 2.5 mg by mouth daily.     budesonide-formoterol (SYMBICORT) 160-4.5 MCG/ACT inhaler INHALE 2 INHALATIONS INTO THE LUNGS 2 TIMES DAILY     citalopram (CELEXA) 20 MG tablet Take 20 mg by mouth daily.     cyanocobalamin 1000 MCG tablet Take 1 tablet (1,000 mcg total) by mouth daily. 30 tablet 3   cyclobenzaprine (FLEXERIL) 5 MG tablet Take 5 mg by mouth 3 (three) times daily as needed for muscle spasms.     Melatonin 5 MG TABS Take 1 tablet by mouth daily.      omeprazole (PRILOSEC) 20 MG capsule Take 20 mg by mouth daily.  (Patient not taking: Reported on 07/19/2022)     QUEtiapine (SEROQUEL) 25 MG tablet Take 25 mg by mouth at bedtime.     Respiratory Therapy Supplies (FLUTTER) DEVI Use as directed 1 each 0    Musculoskeletal: Strength & Muscle Tone: within normal limits Gait & Station:  normal Patient leans: N/A  Psychiatric Specialty Exam: Physical Exam Vitals and nursing note reviewed.  Constitutional:      Appearance: Normal appearance.  HENT:     Nose: Nose normal.  Pulmonary:     Effort: Pulmonary effort is normal. Respiratory distress: No.  Musculoskeletal:        General: Normal range of motion.     Cervical back: Normal range of motion.  Neurological:     General: No focal deficit present.     Mental Status: She is alert and oriented to person, place, and time.  Psychiatric:        Attention and Perception: She is inattentive.        Mood and Affect: Mood is anxious and elated. Affect is labile.        Speech: Speech is rapid and pressured.        Behavior: Behavior is cooperative.        Thought Content: Thought content is delusional.        Cognition and Memory: Cognition is impaired. Memory is impaired.        Judgment: Judgment is inappropriate.     Review of Systems  Psychiatric/Behavioral:  The patient is nervous/anxious.   All other systems reviewed and are negative.   Blood pressure (!) 143/64, pulse 79, temperature 98.6 F (37 C), temperature source Oral, resp. rate 14, height '5\' 4"'$  (1.626 m), weight 53.5 kg, SpO2 95 %.Body mass index is 20.25 kg/m.  General Appearance: Casual  Eye Contact:  Fair  Speech:  Pressured  Volume:  Normal  Mood:  Euphoric and Irritable  Affect:  Labile  Thought Process:  Descriptions of Associations: Loose  Orientation:  Negative  Thought Content:  Delusions  Suicidal Thoughts:   No  Homicidal Thoughts:  No  Memory:  Immediate;   Poor  Judgement:  Poor  Insight:  Lacking  Psychomotor Activity:  Normal  Concentration:  Concentration: Poor and Attention Span: Poor  Recall:  Poor  Fund of Knowledge:  Poor  Language:  Fair  Akathisia:  No  Handed:  Right  AIMS (if indicated):     Assets:  Housing Social Support  ADL's:  Intact  Cognition:  Impaired,  Severe  Sleep:   Impaired     Physical  Exam: Physical Exam Vitals and nursing note reviewed.  Constitutional:      Appearance: Normal appearance.  HENT:     Nose: Nose normal.  Pulmonary:     Effort: Pulmonary effort is normal. Respiratory distress: No.  Musculoskeletal:        General: Normal range of motion.     Cervical back: Normal range of motion.  Neurological:     General: No focal deficit present.     Mental Status: She is alert and oriented to person, place, and time.  Psychiatric:        Attention and Perception: She is inattentive.        Mood and Affect: Mood is anxious and elated. Affect is labile.        Speech: Speech is rapid and pressured.        Behavior: Behavior is cooperative.        Thought Content: Thought content is delusional.        Cognition and Memory: Cognition is impaired. Memory is impaired.        Judgment: Judgment is inappropriate.    Review of Systems  Psychiatric/Behavioral:  The patient is nervous/anxious.   All other systems reviewed and are negative.  Blood pressure (!) 143/64, pulse 79, temperature 98.6 F (37 C), temperature source Oral, resp. rate 14, height '5\' 4"'$  (1.626 m), weight 53.5 kg, SpO2 95 %. Body mass index is 20.25 kg/m.  Treatment Plan Summary: Medication induced bipolar disorder: Saphris 5 mg once  Disposition: Patient does not meet criteria for psychiatric inpatient admission. Supportive therapy provided about ongoing stressors.  Waylan Boga, NP 07/25/2022 2:37 PM

## 2022-07-25 NOTE — ED Notes (Signed)
Pt continues to rest.

## 2022-07-25 NOTE — Progress Notes (Signed)
MRI brain completed-stable changes in the pons.  No new changes.  No abnormal enhancement. Lack of response to the imaging findings to steroids makes me think of the findings of the pontine lesion are more likely small vessel disease related rather than an autoimmune inflammatory process-again also because the CSF was bland, autoimmune encephalitis antibody panel is negative and steroids had no response.  I had a brief discussion over secure chat with the psychiatry provider, they think that this presentation might be steroid-induced mania and insomnia.  I do not disagree that steroids might have some role in the insomnia but her personality change and behavioral change was present before the steroids and is still very much off of her baseline by a lot.  I appreciate recommendations from psychiatry, that they might have regarding the treatment of the insomnia and mania but I do not have any further inpatient neurological input at this time.  She will need continuing outpatient neurology follow-up  Plan was discussed with ED provider and the psychiatry APP.  -- Amie Portland, MD Neurologist [facility] Pager: [phone]

## 2022-07-25 NOTE — ED Notes (Signed)
Pt sleeping. No meds given

## 2022-07-25 NOTE — Consult Note (Signed)
Neurology Consultation  Reason for Consult: Altered mental status Referring Physician: Dr. Hulan Saas  CC: Altered mental status  History is obtained from: Husband, patient, chart  HPI: Yvonne Pitts is a 66 y.o. female discharged yesterday after presumptive diagnosis of autoimmune encephalitis with treatment with high-dose steroids for 5 days, was initially seen by my colleague on 07/18/2022-37 year old with extensive history of tobacco abuse, COPD, lumbar degenerative disc disease brought in for evaluation for 3 weeks of progressive behavioral change personality change and cognitive decline.  She was at her baseline 3 weeks prior to presentation, works full-time and takes care of her adult autistic son.  She started acting strangely when they went for a family vacation to the beach around 4 July holiday.  She had significant change in her personality.  Became extremely talkative, agitated, unable to sleep and became very argumentative.  This was very unlike her baseline personality.  She went to a hospital in [place] and was diagnosed with a UTI.  Started on antibiotics.  Symptoms did not improve but rather became more worse over the next few days.  Her primary care also thought she had sinusitis and antibiotics were changed.  She has received completed course for those. She was evaluated at [facility] by my colleague who obtained an MRI of the brain.  There were isolated T2/FLAIR hyperintensity in bilateral pons as well as medial medulla with no abnormal contrast-enhancement.  No history of malignancy. Work-up completed in the form of TSH, RPR, ammonia and B12-B12 was lower from a neurological standpoint but not abnormally low.   CT angiography head and neck was done with no concern for vasculitis. Spinal tap was done which showed normal glucose, normal protein at 43 and 0 RBC and 2 WBCs. [facility] encephalopathy panel has been sent and has been reported now to be negative  She  was treated in the hospital with 5 days of IV steroids high-dose and showed some improvement although continues to be somewhat more chatty than usual.  Discharged home yesterday.  Patient returns to the emergency room today.  Husband reports that after discharge yesterday after receiving the 5 days of steroids and patient, they went home and she started smoking and drinking Coke and would not stop talking.  She did not go to bed too late at night.  Woke up very early after sleeping for a couple of hours and woke everyone in the household.  Also did not appear at her normal baseline self and kept on talking about things that made no contextual sense to what ever was going on with them in their life.  He brought her back for further evaluation.  In the emergency room, she was evaluated by the ED provider and I was called in for consult since she was just discharged after presumptive autoimmune encephalopathy diagnosis and treatment.  ROS: Full ROS was performed and is negative except as noted in the HPI.   Past Medical History:  Diagnosis Date   COPD (chronic obstructive pulmonary disease) ([place]) 11/03/2016   DDD (degenerative disc disease), lumbar    Polycythemia    Ruptured disc, cervical    Scoliosis      Family History  Problem Relation Age of Onset   Breast cancer Maternal Aunt 40   Bone cancer Paternal Aunt    Colon cancer Maternal Aunt      Social History:   reports that she has been smoking cigarettes. She has a 86.00 pack-year smoking history. She has never  used smokeless tobacco. She reports that she does not currently use alcohol. She reports that she does not use drugs.  Medications No current facility-administered medications for this encounter.  Current Outpatient Medications:    acetaminophen (TYLENOL) 500 MG tablet, Take 500 mg by mouth daily. , Disp: , Rfl:    Albuterol Sulfate (PROAIR RESPICLICK) 322 (90 Base) MCG/ACT AEPB, Inhale 2 puffs into the lungs every 6 (six)  hours as needed., Disp: 2 each, Rfl: 3   amLODipine (NORVASC) 2.5 MG tablet, Take 2.5 mg by mouth daily., Disp: , Rfl:    budesonide-formoterol (SYMBICORT) 160-4.5 MCG/ACT inhaler, INHALE 2 INHALATIONS INTO THE LUNGS 2 TIMES DAILY, Disp: , Rfl:    citalopram (CELEXA) 20 MG tablet, Take 20 mg by mouth daily., Disp: , Rfl:    cyanocobalamin 1000 MCG tablet, Take 1 tablet (1,000 mcg total) by mouth daily., Disp: 30 tablet, Rfl: 3   cyclobenzaprine (FLEXERIL) 5 MG tablet, Take 5 mg by mouth 3 (three) times daily as needed for muscle spasms., Disp: , Rfl:    Melatonin 5 MG TABS, Take 1 tablet by mouth daily. , Disp: , Rfl:    omeprazole (PRILOSEC) 20 MG capsule, Take 20 mg by mouth daily.  (Patient not taking: Reported on 07/19/2022), Disp: , Rfl:    QUEtiapine (SEROQUEL) 25 MG tablet, Take 25 mg by mouth at bedtime., Disp: , Rfl:    Respiratory Therapy Supplies (FLUTTER) DEVI, Use as directed, Disp: 1 each, Rfl: 0   Exam: Current vital signs: BP (!) 163/86   Pulse 83   Temp 98.6 F (37 C) (Oral)   Resp 17   Ht '5\' 4"'$  (1.626 m)   Wt 53.5 kg   SpO2 97%   BMI 20.25 kg/m  Vital signs in last 24 hours: Temp:  [98.6 F (37 C)] 98.6 F (37 C) (07/28 1019) Pulse Rate:  [83-102] 83 (07/28 1200) Resp:  [16-18] 17 (07/28 1200) BP: (144-166)/(73-93) 163/86 (07/28 1200) SpO2:  [94 %-98 %] 97 % (07/28 1200) Weight:  [53.5 kg] 53.5 kg (07/28 1019) General: Awake alert in no distress HNT: Normocephalic atraumatic. Neck supple Respiratory: Breathing well saturating normally on room air Extremities warm well perfused Neurological exam Awake alert oriented x3 Rapid speech but no dysarthria or aphasia She keeps on saying that we are no why she is here and her family does not really understand what she is trying to say. Cranial nerves II to XII intact Motor examination with symmetric strength Sensation intact light touch Coordination exam with no dysmetria Deep tendon reflexes somewhat brisk on  bilateral knees but no clonus.   Labs I have reviewed labs in epic and the results pertinent to this consultation are: My leukocytosis-finished 5 days of IV steroids yesterday.  Mild hyponatremia-likely of no relevance.  CBC    Component Value Date/Time   WBC 16.8 (H) 07/25/2022 1021   RBC 4.74 07/25/2022 1021   HGB 15.1 (H) 07/25/2022 1021   HCT 44.0 07/25/2022 1021   PLT 583 (H) 07/25/2022 1021   MCV 92.8 07/25/2022 1021   MCH 31.9 07/25/2022 1021   MCHC 34.3 07/25/2022 1021   RDW 12.5 07/25/2022 1021   LYMPHSABS 1.2 07/24/2022 0633   MONOABS 0.8 07/24/2022 0633   EOSABS 0.0 07/24/2022 0633   BASOSABS 0.0 07/24/2022 0633    CMP     Component Value Date/Time   NA 134 (L) 07/25/2022 1021   K 3.7 07/25/2022 1021   CL 96 (L) 07/25/2022 1021  CO2 28 07/25/2022 1021   GLUCOSE 99 07/25/2022 1021   BUN 12 07/25/2022 1021   CREATININE 0.52 07/25/2022 1021   CALCIUM 9.7 07/25/2022 1021   PROT 7.4 07/25/2022 1021   ALBUMIN 4.4 07/25/2022 1021   AST 15 07/25/2022 1021   ALT 14 07/25/2022 1021   ALKPHOS 42 07/25/2022 1021   BILITOT 0.6 07/25/2022 1021   GFRNONAA >60 07/25/2022 1021   GFRAA >60 08/17/2019 1114  Urinalysis today with trace leukocyte esterase, rare bacteria, 0-5 WBCs.  [facility] autoimmune encephalopathy CSF panel-from last week  Component 7 d ago  [place] test code [phone]   [facility] test name Autoimmune Encephalopathy Profile, Spinal Fluid   Comment: Performed at [facility], [address] [place], [place] [address]  [place] result COMMENT   Comment: (NOTE)  Test Ordered: [phone] Autoimmune Encephalopathy CSF  Interpretation                 Negative                  BN      Reference Range: Negative                              Anti-Hu Ab                     Negative                  BN      Reference Range: Negative                              This test was developed and its performance characteristics  determined by [facility]. It has  not been cleared or approved  by the Food and Drug Administration.  Anti-Ri Ab                     Negative                  BN      Reference Range: Negative                              This test was developed and its performance characteristics  determined by [facility]. It has not been cleared or approved  by the Food and Drug Administration.  Antineruonal nuclear Ab Type 3 Negative                  BN      Reference Range: Negative                              This test was developed and its performance characteristics  determined by [facility]. It has not been cleared or approved  by the Food and Drug Administration.  PCA Type-1 (Anti-Yo) Ab        Negative                  BN      Reference Range: Negative                              This test was developed and its performance characteristics  determined by [facility]. It  has not been cleared or approved  by the Food and Drug Administration.  Purkinje Cell Cyto Ab Type 2   Negative                  BN      Reference Range: Negative                              This test was developed and its performance characteristics  determined by [facility]. It has not been cleared or approved  by the Food and Drug Administration.  Purkinje Cell Cyto Ab Type Tr  Negative                  BN      Reference Range: Negative                              This test was developed and its performance characteristics  determined by [facility]. It has not been cleared or approved  by the Food and Drug Administration.  Amphiphysin Antibody           Negative                  BN      Reference Range: Negative                              This test was developed and its performance characteristics  determined by [facility]. It has not been cleared or approved  by the Food and Drug Administration.  CRMP-5 IgG                     Negative                  BN      Reference Range: Negative                              This test was developed and its performance  characteristics  determined by [facility]. It has not been cleared or approved  by the Food and Drug Administration.  AGNA-1                         Negative                  BN      Reference Range: Negative                              This test was developed and its performance characteristics  determined by [facility]. It has not been cleared or approved  by the Food and Drug Administration.  DPPX Antibody                  Negative                  BN      Reference Range: Negative                              This test was developed and its performance characteristics  determined by [facility]. It has not been cleared or approved  by  the Food and Drug Administration.  mGluR1 Antibody                Negative                  BN      Reference Range: Negative                              This test was developed and its performance characteristics  determined by [facility]. It has not been cleared or approved  by the Food and Drug Administration.  IgLON5 Antibody                Negative                  BN      Reference Range: Negative                              This test was developed and its performance characteristics  determined by [facility]. It has not been cleared or approved  by the Food and Drug Administration.  Ma2/Ta Antibody                Negative                  BN      Reference Range: Negative                              This test was developed and its performance characteristics  determined by [facility]. It has not been cleared or approved  by the Food and Drug Administration.  Zic4 Antibody                  Negative                  BN      Reference Range: Negative                              This test was developed and its performance characteristics  determined by [facility]. It has not been cleared or approved  by the Food and Drug Administration.  DNER Antibody                  Negative                  BN      Reference Range: Negative                              This  test was developed and its performance characteristics  determined by [facility]. It has not been cleared or approved  by the Food and Drug Administration.  ITPR1 Antibody                 Negative                  BN      Reference Range: Negative                              This test was developed and its performance characteristics  determined by [facility]. It has not  been cleared or approved  by the Food and Drug Administration.  AMPA-R-1 Cell-based IFA        Negative                  BN      Reference Range: Negative                              This test was developed and its performance characteristics  determined by [facility]. It has not been cleared or approved  by the Food and Drug Administration.  AMPA-R-2 Cell-based IFA        Negative                  BN      Reference Range: Negative                              This test was developed and its performance characteristics  determined by [facility]. It has not been cleared or approved  by the Food and Drug Administration.  GABA-B Receptor Cell-based IFA Negative                  BN      Reference Range: Negative                              This test was developed and its performance characteristics  determined by [facility]. It has not been cleared or approved  by the Food and Drug Administration.  NMDAR Antibody, Cell-based IFA Negative                  BN      Reference Range: Negative                              This test was developed and its performance characteristics  determined by [facility]. It has not been cleared or approved  by the Food and Drug Administration.  GAD65 Antibody, Cell-based IFA Negative                  BN      Reference Range: Negative                              This test was developed and its performance characteristics  determined by [facility]. It has not been cleared or approved  by the Food and Drug Administration.  CASPR2 Antibody,Cell-based IFA Negative                  BN      Reference Range:  Negative                              This test was developed and its performance characteristics  determined by [facility]. It has not been cleared or approved  by the Food and Drug Administration.  LGI1 Antibody, Cell-based IFA  Negative                  BN      Reference Range: Negative  This test was developed and its performance characteristics  determined by [facility]. It has not been cleared or approved  by the Food and Drug Administration.  Performed At: [facility]  [place], [place] [id number]  Rush Farmer MD KG:[id number]     Imaging I have reviewed the images obtained:  CT-head: No acute findings MRI brain from 07/16/2022: T2 hyperintensity and T1 hyperintensity in the central pons without restricted diffusion nonspecific but could be a sequela of small vessel disease, can also be seen in CPM.  No evidence of acute or subacute infarct  Assessment: 66 year old admitted with hyperlipidemias of cognitive decline and extensive psychiatric overlay on her exam for the past 3 weeks prior to presentation.   [facility] autoimmune antibody panel since then has come back negative. 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  Radiographic findings differentials included osmotic demyelination -there is no history consistent with sodium changes.  Chronic small vessel ischemic disease explaining the brainstem finding is less likely given minimal amount of small vessel disease elsewhere in the brain parenchyma.  She has a long history of smoking but no personal history of malignancy so a paraneoplastic etiology has to be kept in the differentials.  CT angiogram head and neck not consistent with vasculitis.  CSF studies with normal glucose protein and cell  count.  B12 mildly low for neurological standards of 344.  My plan when I examined her over the past few days during her admission were to complete 5 days of steroids and readdress with psychiatry.  Other possibilities were to also consider IVIG.  Now that  autoimmune antibody panel results are available and they are completely negative, and additionally the CSF that was completely bland without any evidence of inflammation as well as treatment with high-dose steroids for 5 days did not show any good response-- this makes me question the diagnosis of a primary neurological process here.  As documented in the initial consult note by Dr. Quinn Axe, that there is a big psychiatric overlay-makes me wonder if this is a primarily psychiatric process with incidental imaging findings.  Recommendations:  MR brain with and w/o contrast.  Will follow recs after imaging if any major findings seen.  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  Would be worthwhile to re-engage psychiatry for consideration of a primary psychiatric etiology to her presentation due to reasons above for why I do not believe this to be a primary neurological process-unless brain imaging reveals some new stellar findings.  D/W Dr Earnest Conroy. Tamala Julian -- Amie Portland, MD Neurologist [facility] Pager: [phone]

## 2022-07-25 NOTE — ED Provider Notes (Addendum)
[facility] Provider Note    Event Date/Time   First MD Initiated Contact with Patient 07/25/22 1045     (approximate)   History   Altered Mental Status   HPI  Yvonne Pitts is a 66 y.o. female  with PMH depression, COPD, ongoing tobacco use, DDD, polycythemia and recent admission discharged yesterday for evaluation of abnormal behavior.  This followed in the ED visit patient was actually seen by this examiner on 7/19 with fairly extensive work-up including CTA head and neck, MRI brain, labs, urine UDS are all largely unremarkable.  During patient's most recent admission she was evaluated by neurology and psychiatry.  Psychiatry felt this was not a primary psychiatric illness.  Neurology did multiple test including an LP was negative for infectious process but there was some concern for possible autoimmune encephalitis and patient was started on steroids.  She is arriving today via EMS from home for evaluation of altered mental status.  Patient is extremely rambling and tangential and does not seem to make sense.  She is unable provide any significant history.  Per family at bedside she seemed to be doing okay for an hour or 2 before going home for she became completely nonsensical.  They cannot think she is having falls or injuries.  She is denying any acute pain.  He is stating repeatedly "this is because you know how it is"  Summary from Dr Rory Percy consult note yesterday: CSF WBC 2, RBC 0 Protein 43 Glucose 62 Pending: VDRL, culture, OCB, send out to [facility] autoimmune encephalopathy panel on CSF Meningitis PCR panel and cytology were not run 2/2 WBC 2   Ammonia, RPR WNL B12 344 MMA, homocysteine, send out to [facility] clinic ENS2 autoimmune encephalopathy panel on serum is pending   CT c/a/p - no e/o malignancy CTA H&N - no e/o vasculitis rEEG - normal      Physical Exam  Triage Vital Signs: ED Triage Vitals [07/25/22 1019]  Enc Vitals  Group     BP (!) 144/73     Pulse Rate (!) 102     Resp 18     Temp 98.6 F (37 C)     Temp Source Oral     SpO2 94 %     Weight 117 lb 15.1 oz (53.5 kg)     Height '5\' 4"'$  (1.626 m)     Head Circumference      Peak Flow      Pain Score 0     Pain Loc      Pain Edu?      Excl. in [place]?     Most recent vital signs: Vitals:   07/25/22 1330 07/25/22 1414  BP: (!) 143/64   Pulse: 79   Resp: 14   Temp:  98.6 F (37 C)  SpO2: 95%     General: Awake, ill-appearing. CV:  Good peripheral perfusion.  No significant murmur. Resp:  Normal effort.  Clear bilaterally. Abd:  No distention.  Soft. Other:  Cranial nerves are grossly intact.  Patient moving all extremities spontaneously and on command.  She is unable unwilling to participate in finger-nose testing the right upper extremity unable to get into a dispute with the left Significant symmetric.  No pronator drift.   ED Results / Procedures / Treatments  Labs (all labs ordered are listed, but only abnormal results are displayed) Labs Reviewed  COMPREHENSIVE METABOLIC PANEL - Abnormal; Notable for the following components:  Result Value   Sodium 134 (*)    Chloride 96 (*)    All other components within normal limits  CBC - Abnormal; Notable for the following components:   WBC 16.8 (*)    Hemoglobin 15.1 (*)    Platelets 583 (*)    All other components within normal limits  URINALYSIS, COMPLETE (UACMP) WITH MICROSCOPIC - Abnormal; Notable for the following components:   Color, Urine YELLOW (*)    APPearance CLEAR (*)    Leukocytes,Ua TRACE (*)    Bacteria, UA RARE (*)    All other components within normal limits  URINE CULTURE  RESP PANEL BY RT-PCR (FLU A&B, COVID) ARPGX2  ETHANOL  AMMONIA  TSH  PROCALCITONIN  URINE DRUG SCREEN, QUALITATIVE ([facility] ONLY)     EKG  EKG is markable sinus rhythm with a ventricular rate of 89, normal axis with borderline right axis deviation without clearance of acute ischemia.     RADIOLOGY  CT head on my interpretation without evidence of ischemia, edema, mass effect, hemorrhage or other acute process.  I also reviewed radiology's interpretation.  MRI brain on my interpretation without evidence of acute ischemia, hemorrhage, edema or mass effect.  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   PROCEDURES:  Critical Care performed: No  Procedures    MEDICATIONS ORDERED IN ED: Medications  albuterol (PROVENTIL) (2.5 MG/3ML) 0.083% nebulizer solution 3 mL (has no administration in time range)  amLODipine (NORVASC) tablet 2.5 mg (has no administration in time range)  mometasone-formoterol (DULERA) 200-5 MCG/ACT inhaler 2 puff (has no administration in time range)  cyanocobalamin (VITAMIN B12) tablet 1,000 mcg (has no administration in time range)  pantoprazole (PROTONIX) EC tablet 40 mg (has no administration in time range)  QUEtiapine (SEROQUEL) tablet 25 mg (has no administration in time range)  lactated ringers bolus 1,000 mL (1,000 mLs Intravenous New Bag/Given 07/25/22 1127)  gadobutrol (GADAVIST) 1 MMOL/ML injection 5 mL (5 mLs Intravenous Contrast Given 07/25/22 1410)     IMPRESSION / MDM / ASSESSMENT AND PLAN / ED COURSE  I reviewed the triage vital signs and the nursing notes. Patient's presentation is most consistent with acute presentation with potential threat to life or bodily function.                               Differential diagnosis includes, but is not limited to symptoms related to possible autoimmune encephalitis, UTI,, thyroid derangement, hepatic encephalopathy, other metabolic derangement.  She does not seem to have any clear deficits to suggest an acute CVA but does seem encephalopathic.  I reviewed recent admission and work-up which was fairly extensive as summarized above.  Completed course of  steroids before leaving hospital yesterday.  No history exam features to suggest acute trauma.  EKG is markable sinus rhythm with a ventricular rate of 89, normal axis with borderline right axis deviation without clearance of acute ischemia.   CT head on my interpretation without evidence of ischemia, edema, mass effect, hemorrhage or other acute process.  I also reviewed radiology's interpretation.  MRI brain on my interpretation without evidence of acute ischemia, hemorrhage, edema or mass effect.  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  CMP shows no significant  lecture metabolic derangements.  CBC shows WC count of 16.8 hemoglobin of 15.1 and platelets of 583.  Somewhat difficult to interpret as I suspect this may be reactive in the setting of frequent steroids.  Ethanol detectable.  TSH WNL.  Ammonia WNL.  UA has been trace leukocyte esterase and rare bacteria but does not otherwise appear clinically infected.  Urine culture sent.  Procalcitonin is undetectable.  UDS is negative.  Neurology consulted and recommend MRI brain noted above which does not show any acute process.  No other neurological diagnostic studies or interventions recommended at this time and there is concern for primary possible psychiatric cause.  Neurology recommends psychiatry consulted.  Patient seen and evaluated by psychiatry who recommends overnight observation to see if patient is any better tomorrow after hopefully getting some sleep tonight.  I advised family of this.  We will place patient ED psych for status at this time.  The patient has been placed in psychiatric observation due to the need to provide a safe environment for the patient while obtaining psychiatric consultation and evaluation, as well as ongoing medical and medication management to treat the patient's condition.   The patient has been placed under full IVC at this time.    FINAL CLINICAL IMPRESSION(S) / ED DIAGNOSES   Final diagnoses:  Altered mental status, unspecified altered mental status type     Rx / DC Orders   ED Discharge Orders     None        Note:  This document was prepared using Dragon voice recognition software and may include unintentional dictation errors.   Lucrezia Starch, MD 07/25/22 1520    Lucrezia Starch, MD 07/25/22 1520    Lucrezia Starch, MD 07/25/22 1525

## 2022-07-25 NOTE — ED Notes (Signed)
Pt starting to become restless and agitated slightly.

## 2022-07-25 NOTE — ED Notes (Signed)
Dinner tray given

## 2022-07-25 NOTE — ED Notes (Signed)
Pt to ED from lobby with husband and son for AMS. Pt was discharged from hospital yesterday and per husband, pt has continued to have severely altered mental status since about 1 hour after getting home from hospital yesterday. Pt is rambling, accusing husband of not understanding her, talking about God. Flight of ideas and loose associations noted. Lips appear dry. Following commands but is very confused.

## 2022-07-26 DIAGNOSIS — G4700 Insomnia, unspecified: Secondary | ICD-10-CM

## 2022-07-26 LAB — URINE CULTURE

## 2022-07-26 MED ORDER — ASENAPINE MALEATE 5 MG SL SUBL: 5.0000 mg | SUBLINGUAL_TABLET | Freq: Every day | SUBLINGUAL | 0 refills | Status: DC | PRN

## 2022-07-26 MED ORDER — LORAZEPAM 1 MG PO TABS: 1.0000 mg | ORAL_TABLET | Freq: Every evening | ORAL | 0 refills | Status: DC | PRN

## 2022-07-26 MED ORDER — ASENAPINE MALEATE 5 MG SL SUBL: 5.0000 mg | SUBLINGUAL_TABLET | Freq: Every day | SUBLINGUAL | Status: DC | PRN

## 2022-07-26 MED ORDER — LORAZEPAM 1 MG PO TABS: 1.0000 mg | ORAL_TABLET | Freq: Every evening | ORAL | Status: DC | PRN

## 2022-07-26 NOTE — ED Provider Notes (Signed)
The patient has been evaluated at bedside this morning by psychiatry.  Patient is clinically stable at her baseline after sleeping last night.  Not felt to be a danger to self or others.  No SI or Hi.  No indication for inpatient psychiatric admission at this time.  Appropriate for continued outpatient therapy.    Merlyn Lot, MD 07/26/22 [phone]

## 2022-07-26 NOTE — ED Notes (Signed)
Provided pt clothes. Pt dressed and is leaving with husband and son.

## 2022-07-26 NOTE — ED Notes (Signed)
Contacted NP for saphris prescription to be added to discharge prescriptions.

## 2022-07-26 NOTE — ED Notes (Signed)
Psych NP was at bedside talking with pt and will talk now to husband who just came and is sitting in family room.

## 2022-07-26 NOTE — ED Provider Notes (Signed)
Emergency Medicine Observation Re-evaluation Note  Yvonne Pitts is a 66 y.o. female, seen on rounds today.  Pt initially presented to the ED for complaints of Altered Mental Status Currently, the patient is resting, voices no medical complaints.  Physical Exam  BP (!) 147/72 (BP Location: Right Arm)   Pulse 84   Temp 97.8 F (36.6 C) (Oral)   Resp 18   Ht '5\' 4"'$  (1.626 m)   Wt 53.5 kg   SpO2 93%   BMI 20.25 kg/m  Physical Exam General: Resting in no acute distress Cardiac: No cyanosis Lungs: Equal rise and fall Psych: Not agitated  ED Course / MDM  EKG:   I have reviewed the labs performed to date as well as medications administered while in observation.  Recent changes in the last 24 hours include no events overnight.  Plan  Current plan is for psychiatric reassessment. Yvonne Pitts is under involuntary commitment.      Paulette Blanch, MD 07/26/22 [phone]

## 2022-07-26 NOTE — Consult Note (Signed)
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 female patient admitted with continued altered mental status.  Client slept with Ativan and is back to her baseline, calm and cooperative, coherent.  Her husband visited with her and agrees she is at her baseline with some sleep.  He desires to take her home with a RX for sleep, Ativan provided and explained not to give it too early so her days and nights get mixed up.  IF the Ativan does not work, PRN Saphris provided in those cases.  This provider suspects she will be fine with just the Ativan once she gets on a regular sleep pattern again.  No suicidal/homicidal ideations, mania behaviors, or psychosis; cleared for discharge.  HPI on 7/28:  Yvonne Pitts is a 66 y.o. female presenting to the ED one day after being discharged due to a continued altered mental status. Patient's husband and son at bedside. The husband reports that changes in her personality first presented during the family vacation on the 4th of July. Patient was treated for a UTI with antibiotics but symptoms continued to worsen. A second antibiotic was prescribed upon returning home and a follow-up UA showed the UTI had not resolved but altered mental status and poor sleep remained. Patient was admitted to [facility] for work-up. This last Sunday patient was placed on 5-day steroid pack which was completed yesterday.  Patient was discharged yesterday; however, husband states symptoms of excessive talking, agitation, and difficulty sleeping continued after discharge yesterday. According to the husband, patient did not go to sleep until  around 4 a.m. this morning and only rested a couple hours. Continues to use caffeine and nicotine.  No prior psychiatric history.  Husband reports appetite remains intact. He is agreeable for something to help her sleep and her mood, Saphris.  This should remedy many of her symptoms and will not be needed long term as she does not have a psych history, insomnia and steroids appear to be her issues, will re-evaluate in the am.  Past Psychiatric History: none  Risk to Self:  none Risk to Others:  none Prior Inpatient Therapy:  none Prior Outpatient Therapy:  none  Past Medical History:  Past Medical History:  Diagnosis Date   COPD (chronic obstructive pulmonary disease) ([place]) 11/03/2016   DDD (degenerative disc disease), lumbar    Polycythemia    Ruptured disc, cervical    Scoliosis     Past Surgical History:  Procedure Laterality Date   BREAST EXCISIONAL BIOPSY Right    years ago   DILATION AND CURETTAGE OF UTERUS     NECK SURGERY     Family History:  Family History  Problem Relation Age of Onset   Breast cancer Maternal Aunt 32   Bone cancer Paternal Aunt    Colon cancer Maternal Aunt    Family Psychiatric History: autistic son Social History:  Social History   Substance and Sexual Activity  Alcohol Use Not Currently   Comment: seldom maybe 2-3 times a year     Social History   Substance and Sexual Activity  Drug Use No    Social History   Socioeconomic History   Marital  status: Married    Spouse name: Not on file   Number of children: Not on file   Years of education: Not on file   Highest education level: Not on file  Occupational History   Not on file  Tobacco Use   Smoking status: Every Day    Packs/day: 2.00    Years: 43.00    Total pack years: 86.00    Types: Cigarettes   Smokeless tobacco: Never  Vaping Use   Vaping Use: Never used  Substance and Sexual Activity   Alcohol use: Not Currently    Comment: seldom maybe 2-3 times a year   Drug use:  No   Sexual activity: Not Currently  Other Topics Concern   Not on file  Social History Narrative   Not on file   Social Determinants of Health   Financial Resource Strain: Not on file  Food Insecurity: Not on file  Transportation Needs: Not on file  Physical Activity: Not on file  Stress: Not on file  Social Connections: Not on file   Additional Social History:    Allergies:   Allergies  Allergen Reactions   Prochlorperazine Maleate     Other reaction(s): Unknown   Ibuprofen Rash    Labs:  Results for orders placed or performed during the hospital encounter of 07/25/22 (from the past 48 hour(s))  Comprehensive metabolic panel     Status: Abnormal   Collection Time: 07/25/22 10:21 AM  Result Value Ref Range   Sodium 134 (L) 135 - 145 mmol/L   Potassium 3.7 3.5 - 5.1 mmol/L   Chloride 96 (L) 98 - 111 mmol/L   CO2 28 22 - 32 mmol/L   Glucose, Bld 99 70 - 99 mg/dL    Comment: Glucose reference range applies only to samples taken after fasting for at least 8 hours.   BUN 12 8 - 23 mg/dL   Creatinine, Ser 0.52 0.44 - 1.00 mg/dL   Calcium 9.7 8.9 - 10.3 mg/dL   Total Protein 7.4 6.5 - 8.1 g/dL   Albumin 4.4 3.5 - 5.0 g/dL   AST 15 15 - 41 U/L   ALT 14 0 - 44 U/L   Alkaline Phosphatase 42 38 - 126 U/L   Total Bilirubin 0.6 0.3 - 1.2 mg/dL   GFR, Estimated >60 >60 mL/min    Comment: (NOTE) Calculated using the CKD-EPI Creatinine Equation (2021)    Anion gap 10 5 - 15    Comment: Performed at [facility], [address] [place], [place] [address]  CBC     Status: Abnormal   Collection Time: 07/25/22 10:21 AM  Result Value Ref Range   WBC 16.8 (H) 4.0 - 10.5 K/uL   RBC 4.74 3.87 - 5.11 MIL/uL   Hemoglobin 15.1 (H) 12.0 - 15.0 g/dL   HCT 44.0 36.0 - 46.0 %   MCV 92.8 80.0 - 100.0 fL   MCH 31.9 26.0 - 34.0 pg   MCHC 34.3 30.0 - 36.0 g/dL   RDW 12.5 11.5 - 15.5 %   Platelets 583 (H) 150 - 400 K/uL   nRBC 0.0 0.0 - 0.2 %    Comment: Performed at  [facility], [address] [place], [place] [address]  Ethanol     Status: None   Collection Time: 07/25/22 10:21 AM  Result Value Ref Range   Alcohol, Ethyl (B) <10 <10 mg/dL    Comment: (NOTE) Lowest detectable limit for serum alcohol is 10 mg/dL.  For medical purposes only. Performed at [facility], [address] [place], [place] [address]   TSH     Status: None   Collection Time: 07/25/22 10:21 AM  Result Value Ref Range   TSH 3.714 0.350 - 4.500 uIU/mL    Comment: Performed by a 3rd Generation assay with a functional sensitivity of <=0.01 uIU/mL. Performed at [facility], [address] [place], [place] [address]   Procalcitonin - Baseline     Status: None   Collection Time: 07/25/22 10:21 AM  Result Value Ref Range   Procalcitonin <0.10 ng/mL    Comment:        Interpretation: PCT (Procalcitonin) <= 0.5 ng/mL: Systemic infection (sepsis) is not likely. Local bacterial infection is possible. (NOTE)       Sepsis PCT Algorithm           Lower Respiratory Tract                                      Infection PCT Algorithm    ----------------------------     ----------------------------         PCT < 0.25 ng/mL                PCT < 0.10 ng/mL          Strongly encourage             Strongly discourage   discontinuation of antibiotics    initiation of antibiotics    ----------------------------     -----------------------------       PCT 0.25 - 0.50 ng/mL            PCT 0.10 - 0.25 ng/mL               OR       >80% decrease in PCT            Discourage initiation of                                            antibiotics      Encourage discontinuation           of antibiotics    ----------------------------     -----------------------------         PCT >= 0.50 ng/mL              PCT 0.26 - 0.50 ng/mL               AND        <80% decrease in PCT             Encourage initiation of                                              antibiotics       Encourage continuation           of antibiotics    ----------------------------     -----------------------------        PCT >= 0.50 ng/mL                  PCT > 0.50 ng/mL  AND         increase in PCT                  Strongly encourage                                      initiation of antibiotics    Strongly encourage escalation           of antibiotics                                     -----------------------------                                           PCT <= 0.25 ng/mL                                                 OR                                        > 80% decrease in PCT                                      Discontinue / Do not initiate                                             antibiotics  Performed at [facility], [address] [place], [place] [address]   Ammonia     Status: None   Collection Time: 07/25/22 10:22 AM  Result Value Ref Range   Ammonia 14 9 - 35 umol/L    Comment: Performed at [facility], [address] [place], [place] [address]  Urinalysis, Complete w Microscopic Urine, Clean Catch     Status: Abnormal   Collection Time: 07/25/22 11:29 AM  Result Value Ref Range   Color, Urine YELLOW (A) YELLOW   APPearance CLEAR (A) CLEAR   Specific Gravity, Urine 1.009 1.005 - 1.030   pH 7.0 5.0 - 8.0   Glucose, UA NEGATIVE NEGATIVE mg/dL   Hgb urine dipstick NEGATIVE NEGATIVE   Bilirubin Urine NEGATIVE NEGATIVE   Ketones, ur NEGATIVE NEGATIVE mg/dL   Protein, ur NEGATIVE NEGATIVE mg/dL   Nitrite NEGATIVE NEGATIVE   Leukocytes,Ua TRACE (A) NEGATIVE   RBC / HPF 0-5 0 - 5 RBC/hpf   WBC, UA 0-5 0 - 5 WBC/hpf   Bacteria, UA RARE (A) NONE SEEN   Squamous Epithelial / LPF 0-5 0 - 5   Hyaline Casts, UA PRESENT     Comment: Performed at [facility], [address] [place], [place] [address]  Urine Drug Screen, Qualitative ([facility] only)     Status: None   Collection Time: 07/25/22 11:29  AM  Result Value Ref Range   Tricyclic, Ur  Screen NONE DETECTED NONE DETECTED   Amphetamines, Ur Screen NONE DETECTED NONE DETECTED   MDMA (Ecstasy)Ur Screen NONE DETECTED NONE DETECTED   Cocaine Metabolite,Ur [place] NONE DETECTED NONE DETECTED   Opiate, Ur Screen NONE DETECTED NONE DETECTED   Phencyclidine (PCP) Ur S NONE DETECTED NONE DETECTED   Cannabinoid 50 Ng, Ur [place] NONE DETECTED NONE DETECTED   Barbiturates, Ur Screen NONE DETECTED NONE DETECTED   Benzodiazepine, Ur Scrn NONE DETECTED NONE DETECTED   Methadone Scn, Ur NONE DETECTED NONE DETECTED    Comment: (NOTE) Tricyclics + metabolites, urine    Cutoff 1000 ng/mL Amphetamines + metabolites, urine  Cutoff 1000 ng/mL MDMA (Ecstasy), urine              Cutoff 500 ng/mL Cocaine Metabolite, urine          Cutoff 300 ng/mL Opiate + metabolites, urine        Cutoff 300 ng/mL Phencyclidine (PCP), urine         Cutoff 25 ng/mL Cannabinoid, urine                 Cutoff 50 ng/mL Barbiturates + metabolites, urine  Cutoff 200 ng/mL Benzodiazepine, urine              Cutoff 200 ng/mL Methadone, urine                   Cutoff 300 ng/mL  The urine drug screen provides only a preliminary, unconfirmed analytical test result and should not be used for non-medical purposes. Clinical consideration and professional judgment should be applied to any positive drug screen result due to possible interfering substances. A more specific alternate chemical method must be used in order to obtain a confirmed analytical result. Gas chromatography / mass spectrometry (GC/MS) is the preferred confirm atory method. Performed at [facility], [place], [place] [address]   Resp Panel by RT-PCR (Flu A&B, Covid) Anterior Nasal Swab     Status: None   Collection Time: 07/25/22  6:21 PM   Specimen: Anterior Nasal Swab  Result Value Ref Range   SARS Coronavirus 2 by RT PCR NEGATIVE NEGATIVE    Comment: (NOTE) SARS-CoV-2 target nucleic  acids are NOT DETECTED.  The SARS-CoV-2 RNA is generally detectable in upper respiratory specimens during the acute phase of infection. The lowest concentration of SARS-CoV-2 viral copies this assay can detect is 138 copies/mL. A negative result does not preclude SARS-Cov-2 infection and should not be used as the sole basis for treatment or other patient management decisions. A negative result may occur with  improper specimen collection/handling, submission of specimen other than nasopharyngeal swab, presence of viral mutation(s) within the areas targeted by this assay, and inadequate number of viral copies(<138 copies/mL). A negative result must be combined with clinical observations, patient history, and epidemiological information. The expected result is Negative.  Fact Sheet for Patients:  EntrepreneurPulse.com.au  Fact Sheet for Healthcare Providers:  IncredibleEmployment.be  This test is no t yet approved or cleared by the [place] FDA and  has been authorized for detection and/or diagnosis of SARS-CoV-2 by FDA under an Emergency Use Authorization (EUA). This EUA will remain  in effect (meaning this test can be used) for the duration of the COVID-19 declaration under Section 564(b)(1) of the Act, 21 U.S.C.section 360bbb-3(b)(1), unless the authorization is terminated  or revoked sooner.       Influenza A by PCR NEGATIVE NEGATIVE   Influenza B by PCR NEGATIVE  NEGATIVE    Comment: (NOTE) The Xpert Xpress SARS-CoV-2/FLU/RSV plus assay is intended as an aid in the diagnosis of influenza from Nasopharyngeal swab specimens and should not be used as a sole basis for treatment. Nasal washings and aspirates are unacceptable for Xpert Xpress SARS-CoV-2/FLU/RSV testing.  Fact Sheet for Patients: EntrepreneurPulse.com.au  Fact Sheet for Healthcare Providers: IncredibleEmployment.be  This test is not  yet approved or cleared by the [place] FDA and has been authorized for detection and/or diagnosis of SARS-CoV-2 by FDA under an Emergency Use Authorization (EUA). This EUA will remain in effect (meaning this test can be used) for the duration of the COVID-19 declaration under Section 564(b)(1) of the Act, 21 U.S.C. section 360bbb-3(b)(1), unless the authorization is terminated or revoked.  Performed at [facility], [address] [place], [place] [address]     Current Facility-Administered Medications  Medication Dose Route Frequency Provider Last Rate Last Admin   albuterol (PROVENTIL) (2.5 MG/3ML) 0.083% nebulizer solution 3 mL  3 mL Inhalation Q6H PRN Lucrezia Starch, MD       amLODipine (NORVASC) tablet 2.5 mg  2.5 mg Oral Daily Lucrezia Starch, MD   2.5 mg at 07/26/22 9024   cyanocobalamin (VITAMIN B12) tablet 1,000 mcg  1,000 mcg Oral Daily Lucrezia Starch, MD   1,000 mcg at 07/26/22 0973   LORazepam (ATIVAN) tablet 1 mg  1 mg Oral QHS PRN Patrecia Pour, NP       mometasone-formoterol (DULERA) 200-5 MCG/ACT inhaler 2 puff  2 puff Inhalation BID Lucrezia Starch, MD       pantoprazole (PROTONIX) EC tablet 40 mg  40 mg Oral Daily Lucrezia Starch, MD   40 mg at 07/26/22 5329   Current Outpatient Medications  Medication Sig Dispense Refill   amLODipine (NORVASC) 2.5 MG tablet Take 2.5 mg by mouth daily.     budesonide-formoterol (SYMBICORT) 160-4.5 MCG/ACT inhaler INHALE 2 INHALATIONS INTO THE LUNGS 2 TIMES DAILY     citalopram (CELEXA) 20 MG tablet Take 20 mg by mouth daily.     cyanocobalamin 1000 MCG tablet Take 1 tablet (1,000 mcg total) by mouth daily. 30 tablet 3   Melatonin 5 MG TABS Take 1 tablet by mouth daily.      omeprazole (PRILOSEC) 20 MG capsule Take 20 mg by mouth daily.     QUEtiapine (SEROQUEL) 25 MG tablet Take 25 mg by mouth at bedtime.     acetaminophen (TYLENOL) 500 MG tablet Take 500 mg by mouth daily.      Albuterol Sulfate (PROAIR  RESPICLICK) 924 (90 Base) MCG/ACT AEPB Inhale 2 puffs into the lungs every 6 (six) hours as needed. 2 each 3   cyclobenzaprine (FLEXERIL) 5 MG tablet Take 5 mg by mouth 3 (three) times daily as needed for muscle spasms.     LORazepam (ATIVAN) 1 MG tablet Take 1 tablet (1 mg total) by mouth at bedtime as needed for sleep. 30 tablet 0   Respiratory Therapy Supplies (FLUTTER) DEVI Use as directed 1 each 0    Musculoskeletal: Strength & Muscle Tone: within normal limits Gait & Station: normal Patient leans: N/A  Psychiatric Specialty Exam: Physical Exam Vitals and nursing note reviewed.  Constitutional:      Appearance: Normal appearance.  HENT:     Nose: Nose normal.  Pulmonary:     Effort: Pulmonary effort is normal. Respiratory distress: No.  Musculoskeletal:        General: Normal range of motion.  Cervical back: Normal range of motion.  Neurological:     General: No focal deficit present.     Mental Status: She is alert and oriented to person, place, and time.  Psychiatric:        Attention and Perception: Attention and perception normal.        Mood and Affect: Mood and affect normal.        Speech: Speech normal.        Behavior: Behavior is cooperative.        Thought Content: Thought content normal.        Cognition and Memory: Cognition and memory normal.        Judgment: Judgment normal.     Review of Systems  Psychiatric/Behavioral:  The patient has insomnia.   All other systems reviewed and are negative.   Blood pressure (!) 146/60, pulse 60, temperature 98.2 F (36.8 C), temperature source Oral, resp. rate 18, height '5\' 4"'$  (1.626 m), weight 53.5 kg, SpO2 96 %.Body mass index is 20.25 kg/m.  General Appearance: Casual  Eye Contact:  Good  Speech:  WDL  Volume:  Normal  Mood:  Anxious, mild  Affect:  WDL  Thought Process:  Clear and coherent  Orientation:  A & O x 3  Thought Content:  WDL  Suicidal Thoughts:   No  Homicidal Thoughts:  No  Memory:  Fair   Judgement:  Good  Insight:  Fair  Psychomotor Activity:  Normal  Concentration:  Good  Recall:  [place] of Knowledge:  Good  Language:  Good  Akathisia:  No  Handed:  Right  AIMS (if indicated):     Assets:  Housing Social Support  ADL's:  Intact  Cognition:  WDL  Sleep:   Good last night     Physical Exam: Physical Exam Vitals and nursing note reviewed.  Constitutional:      Appearance: Normal appearance.  HENT:     Nose: Nose normal.  Pulmonary:     Effort: Pulmonary effort is normal. Respiratory distress: No.  Musculoskeletal:        General: Normal range of motion.     Cervical back: Normal range of motion.  Neurological:     General: No focal deficit present.     Mental Status: She is alert and oriented to person, place, and time.  Psychiatric:        Attention and Perception: Attention and perception normal.        Mood and Affect: Mood and affect normal.        Speech: Speech normal.        Behavior: Behavior is cooperative.        Thought Content: Thought content normal.        Cognition and Memory: Cognition and memory normal.        Judgment: Judgment normal.    Review of Systems  Psychiatric/Behavioral:  The patient has insomnia.   All other systems reviewed and are negative.  Blood pressure (!) 146/60, pulse 60, temperature 98.2 F (36.8 C), temperature source Oral, resp. rate 18, height '5\' 4"'$  (1.626 m), weight 53.5 kg, SpO2 96 %. Body mass index is 20.25 kg/m.  Treatment Plan Summary: Insomnia: Ativan 1 mg daily at bedtime PRN Saphris 5 mg SL daily at bedtime PRN IF Ativan does not work  Disposition: Discharge home, follow up with neuro next week (appt in place)  Waylan Boga, NP 07/26/2022 10:03 AM

## 2022-07-26 NOTE — ED Notes (Signed)
Helped pt to toilet to void. Steady gait noted. Pt is calm and cooperative at this time, states slept well last night. Pt is not agitated at this time but continues to be mildly confused.

## 2022-07-26 NOTE — ED Notes (Signed)
Pt advised to change for D/C

## 2022-07-26 NOTE — ED Notes (Signed)
Pt is to be discharged with PRN ativan for agitation. IVC is still not rescinded, unable to print AVS. Contacted psych NP and EDP to have IVC rescinded so can discharge pt.

## 2022-07-31 ENCOUNTER — Ambulatory Visit: Payer: Self-pay | Admitting: *Deleted

## 2022-07-31 NOTE — Telephone Encounter (Signed)
  Chief Complaint: questions about nicotine patch Symptoms: smoking with patch Frequency: already smoked half pack this morning Pertinent Negatives: Patient denies na Disposition: '[]'$ ED /'[]'$ Urgent Care (no appt availability in office) / '[]'$ Appointment(In office/virtual)/ '[]'$  [facility]/ '[]'$ Home Care/ '[]'$ Refused Recommended Disposition /'[]'$ [facility]/ '[]'$  Follow-up with PCP Additional Notes: pt just asking if can smoke and wear nicotine patch.  States it has not decreased her smoking. We discussed the gum may help since it is in the mouth. She is waiting on her PCP to call back and she is going to ask for Nicotine gum rx. Some doses OTC, not sure about beginning dose. Pt encouraged to join a support group through [facility].  Reason for Disposition  Requesting to talk with a counselor to help quit smoking (helpline or quitline)  Answer Assessment - Initial Assessment Questions 1. MAIN CONCERN or SYMPTOMS: "What question or concern do you have?" "What symptoms are you having?" (e.g., none, cough, difficulty breathing, headache, dizziness, nausea)      Still smoking 2. ONSET:  "When did the symptoms begin?"     No symptoms 3. PRODUCT USED: "What do you smoke?" (e.g., cigarette, cigar, e-cigarette, pipe)      cigarette 4. OTHER PRODUCTS: "Do you use other tobacco or nicotine products?" (e.g., smokeless tobacco, chew or snuff, hookah, hand rolled tobacco in a leaf)      Starting patch 4. HOW OFTEN: "How many times per day or week do you typically smoke?" (e.g., 1 pack / week, half pack / day, 2 packs / day)     Used to smoke 2 packs a day, now not sure but has cut back 5.  HOW LONG: "How long have you smoked?" (e.g., months, years)     44 years 6. TREATMENT:  "What treatment have you tried? (e.g., none, gum, patch, medicine, treatment program)     Patch but smoking with it 7. PREGNANCY: "Is there any chance you are pregnant ?" "When was your last menstrual period?"      no  Protocols used: Smoking - Tobacco Use and Problems-A-AH

## 2022-08-05 LAB — MISC LABCORP TEST (SEND OUT): Labcorp test code: 9985

## 2022-08-07 ENCOUNTER — Emergency Department: Payer: Medicare HMO

## 2022-08-07 ENCOUNTER — Emergency Department
Admission: EM | Admit: 2022-08-07 | Discharge: 2022-08-08 | Disposition: A | Discharge status: Other Acute Inpatient Hospital | Payer: Medicare HMO | Attending: Emergency Medicine | Admitting: Emergency Medicine

## 2022-08-07 ENCOUNTER — Other Ambulatory Visit: Payer: Self-pay

## 2022-08-07 ENCOUNTER — Encounter: Payer: Self-pay | Admitting: Emergency Medicine

## 2022-08-07 DIAGNOSIS — R462 Strange and inexplicable behavior: Secondary | ICD-10-CM | POA: Diagnosis not present

## 2022-08-07 DIAGNOSIS — R4689 Other symptoms and signs involving appearance and behavior: Secondary | ICD-10-CM | POA: Diagnosis not present

## 2022-08-07 DIAGNOSIS — J449 Chronic obstructive pulmonary disease, unspecified: Secondary | ICD-10-CM | POA: Diagnosis not present

## 2022-08-07 DIAGNOSIS — R4182 Altered mental status, unspecified: Secondary | ICD-10-CM

## 2022-08-07 DIAGNOSIS — Z20822 Contact with and (suspected) exposure to covid-19: Secondary | ICD-10-CM | POA: Insufficient documentation

## 2022-08-07 LAB — URINALYSIS, ROUTINE W REFLEX MICROSCOPIC
Bacteria, UA: NONE SEEN
Bilirubin Urine: NEGATIVE
Glucose, UA: NEGATIVE mg/dL
Hgb urine dipstick: NEGATIVE
Ketones, ur: NEGATIVE mg/dL
Leukocytes,Ua: NEGATIVE
Nitrite: POSITIVE — AB
Protein, ur: NEGATIVE mg/dL
Specific Gravity, Urine: 1.004 — ABNORMAL LOW (ref 1.005–1.030)
pH: 6 (ref 5.0–8.0)

## 2022-08-07 LAB — COMPREHENSIVE METABOLIC PANEL
ALT: 12 U/L (ref 0–44)
AST: 17 U/L (ref 15–41)
Albumin: 4 g/dL (ref 3.5–5.0)
Alkaline Phosphatase: 49 U/L (ref 38–126)
Anion gap: 6 (ref 5–15)
BUN: 8 mg/dL (ref 8–23)
CO2: 26 mmol/L (ref 22–32)
Calcium: 9.2 mg/dL (ref 8.9–10.3)
Chloride: 107 mmol/L (ref 98–111)
Creatinine, Ser: 0.44 mg/dL (ref 0.44–1.00)
GFR, Estimated: >60 mL/min (ref >60)
Glucose, Bld: 105 mg/dL — ABNORMAL HIGH (ref 70–99)
Potassium: 3.5 mmol/L (ref 3.5–5.1)
Sodium: 139 mmol/L (ref 135–145)
Total Bilirubin: 0.6 mg/dL (ref 0.3–1.2)
Total Protein: 7 g/dL (ref 6.5–8.1)

## 2022-08-07 LAB — CBC WITH DIFFERENTIAL/PLATELET
Abs Immature Granulocytes: 0.05 10*3/uL (ref 0.00–0.07)
Basophils Absolute: 0 10*3/uL (ref 0.0–0.1)
Basophils Relative: 0 %
Eosinophils Absolute: 0.1 10*3/uL (ref 0.0–0.5)
Eosinophils Relative: 1 %
HCT: 42.4 % (ref 36.0–46.0)
Hemoglobin: 13.9 g/dL (ref 12.0–15.0)
Immature Granulocytes: 1 %
Lymphocytes Relative: 16 %
Lymphs Abs: 1.6 10*3/uL (ref 0.7–4.0)
MCH: 31.9 pg (ref 26.0–34.0)
MCHC: 32.8 g/dL (ref 30.0–36.0)
MCV: 97.2 fL (ref 80.0–100.0)
Monocytes Absolute: 0.5 10*3/uL (ref 0.1–1.0)
Monocytes Relative: 6 %
Neutro Abs: 7.5 10*3/uL (ref 1.7–7.7)
Neutrophils Relative %: 76 %
Platelets: 363 10*3/uL (ref 150–400)
RBC: 4.36 MIL/uL (ref 3.87–5.11)
RDW: 13.3 % (ref 11.5–15.5)
WBC: 9.9 10*3/uL (ref 4.0–10.5)
nRBC: 0 % (ref 0.0–0.2)

## 2022-08-07 LAB — URINE DRUG SCREEN, QUALITATIVE (ARMC ONLY)
Amphetamines, Ur Screen: NOT DETECTED
Barbiturates, Ur Screen: NOT DETECTED
Benzodiazepine, Ur Scrn: NOT DETECTED
Cannabinoid 50 Ng, Ur ~~LOC~~: NOT DETECTED
Cocaine Metabolite,Ur ~~LOC~~: NOT DETECTED
MDMA (Ecstasy)Ur Screen: NOT DETECTED
Methadone Scn, Ur: NOT DETECTED
Opiate, Ur Screen: NOT DETECTED
Phencyclidine (PCP) Ur S: NOT DETECTED
Tricyclic, Ur Screen: NOT DETECTED

## 2022-08-07 LAB — AMMONIA: Ammonia: <10 umol/L (ref 9–35)

## 2022-08-07 LAB — RESP PANEL BY RT-PCR (FLU A&B, COVID) ARPGX2
Influenza A by PCR: NEGATIVE
Influenza B by PCR: NEGATIVE
SARS Coronavirus 2 by RT PCR: NEGATIVE

## 2022-08-07 LAB — ETHANOL: Alcohol, Ethyl (B): <10 mg/dL (ref <10)

## 2022-08-07 LAB — TROPONIN I (HIGH SENSITIVITY): Troponin I (High Sensitivity): 5 ng/L (ref <18)

## 2022-08-07 MED ORDER — DIPHENHYDRAMINE HCL 50 MG/ML IJ SOLN
50.0000 mg | Freq: Once | INTRAMUSCULAR | Status: AC | PRN
  Administered 2022-08-07: 50 mg via INTRAMUSCULAR
  Filled 2022-08-07: qty 1

## 2022-08-07 MED ORDER — ASENAPINE MALEATE 5 MG SL SUBL
5.0000 mg | SUBLINGUAL_TABLET | Freq: Every day | SUBLINGUAL | Status: DC
  Filled 2022-08-07: qty 1

## 2022-08-07 MED ORDER — LORAZEPAM 2 MG/ML IJ SOLN
2.0000 mg | Freq: Once | INTRAMUSCULAR | Status: AC | PRN
  Administered 2022-08-07: 2 mg via INTRAMUSCULAR
  Filled 2022-08-07: qty 1

## 2022-08-07 MED ORDER — ASENAPINE MALEATE 5 MG SL SUBL
5.0000 mg | SUBLINGUAL_TABLET | Freq: Every day | SUBLINGUAL | Status: DC
  Administered 2022-08-07 – 2022-08-08 (×2): 5 mg via SUBLINGUAL
  Filled 2022-08-07 (×3): qty 1

## 2022-08-07 MED ORDER — OLANZAPINE 5 MG PO TBDP
10.0000 mg | ORAL_TABLET | Freq: Once | ORAL | Status: AC
  Administered 2022-08-07: 10 mg via ORAL
  Filled 2022-08-07: qty 2

## 2022-08-07 MED ORDER — HALOPERIDOL LACTATE 5 MG/ML IJ SOLN
5.0000 mg | Freq: Once | INTRAMUSCULAR | Status: AC | PRN
  Administered 2022-08-07: 5 mg via INTRAMUSCULAR
  Filled 2022-08-07: qty 1

## 2022-08-07 NOTE — ED Notes (Signed)
Resumed care from [facility].  Pt alert, watching tv.  Siderails up x 2.  Pt denies SI or HI.

## 2022-08-07 NOTE — ED Notes (Signed)
Patient stating she is wanting to leave because she has been up since 0200. States she woke her husband up at 0430 because she wanted him to see the rain. Patient continues to ask for "Romilda Joy" because she was going to go home. Continues to states " that was funny won't it. I thought it was funny to me" "It was only two weeks, Im leaving anyway, that baby face doctor knows I dont need to wait"

## 2022-08-07 NOTE — ED Provider Notes (Signed)
[facility] Provider Note   Event Date/Time   First MD Initiated Contact with Patient 08/07/22 [phone]     (approximate) History  Altered Mental Status  HPI Yvonne Pitts is a 66 y.o. female with a past medical history of COPD continued tobacco abuse, depression who presents at the request of her family stating that she has been more altered than her baseline.  Family does state that she was in her normal state of health until she was diagnosed with a urinary tract infection.  Family states that patient's mental status and came back to her baseline after that however it did improve with treatment for urinary tract infection.  Family states that since that time she has had relatively normal mental status until halfway through the day into the evening her mental status declined significantly when she begins talking about speaking to god as well as other people that the family cannot see or hear. Patient denies any pain at this time and when asked why she is here she states "God only knows".  Patient denies any pain at this time.   Physical Exam  Triage Vital Signs: ED Triage Vitals  Enc Vitals Group     BP 08/07/22 0721 (!) 151/72     Pulse Rate 08/07/22 0721 69     Resp 08/07/22 0721 16     Temp 08/07/22 0721 98.3 F (36.8 C)     Temp Source 08/07/22 0721 Oral     SpO2 08/07/22 0702 98 %     Weight 08/07/22 0721 117 lb 15.1 oz (53.5 kg)     Height 08/07/22 0721 '5\' 4"'$  (1.626 m)     Head Circumference --      Peak Flow --      Pain Score --      Pain Loc --      Pain Edu? --      Excl. in [place]? --    Most recent vital signs: Vitals:   08/07/22 0909 08/07/22 1508  BP: (!) 146/58 (!) 156/58  Pulse: 61 78  Resp: 16 17  Temp:    SpO2: 98% 94%   General: Awake, oriented x3 CV:  Good peripheral perfusion.  Resp:  Normal effort.  Abd:  No distention.  Other:  Elderly Caucasian female sitting in bed agitated and attempting to leave the room during our  conversation stating that she needs to find her husband. ED Results / Procedures / Treatments  Labs (all labs ordered are listed, but only abnormal results are displayed) Labs Reviewed  URINALYSIS, ROUTINE W REFLEX MICROSCOPIC - Abnormal; Notable for the following components:      Result Value   Color, Urine AMBER (*)    APPearance CLEAR (*)    Specific Gravity, Urine 1.004 (*)    Nitrite POSITIVE (*)    All other components within normal limits  COMPREHENSIVE METABOLIC PANEL - Abnormal; Notable for the following components:   Glucose, Bld 105 (*)    All other components within normal limits  RESP PANEL BY RT-PCR (FLU A&B, COVID) ARPGX2  CBC WITH DIFFERENTIAL/PLATELET  AMMONIA  URINE DRUG SCREEN, QUALITATIVE ([facility] ONLY)  ETHANOL  TROPONIN I (HIGH SENSITIVITY)  RADIOLOGY ED MD interpretation: CT of the head without contrast interpreted by me shows no evidence of acute abnormalities including no intracerebral hemorrhage, obvious masses, or significant edema -Agree with radiology assessment Official radiology report(s): CT Head Wo Contrast  Result Date: 08/07/2022 CLINICAL DATA:  Mental status change, unknown  cause EXAM: CT HEAD WITHOUT CONTRAST TECHNIQUE: Contiguous axial images were obtained from the base of the skull through the vertex without intravenous contrast. RADIATION DOSE REDUCTION: This exam was performed according to the departmental dose-optimization program which includes automated exposure control, adjustment of the mA and/or kV according to patient size and/or use of iterative reconstruction technique. COMPARISON:  MRI 07/25/2022. FINDINGS: Brain: No evidence of acute infarction, hemorrhage, hydrocephalus, extra-axial collection or mass lesion/mass effect. Mild cerebellar tonsillar ectopia, better characterized on prior MRI. Vascular: No hyperdense vessel identified. Skull: No acute fracture. Sinuses/Orbits: Clear sinuses.  No acute orbital findings. Other: No mastoid  effusions. IMPRESSION: No evidence of acute intracranial abnormality. Electronically Signed   By: Margaretha Sheffield M.D.   On: 08/07/2022 09:12   PROCEDURES: Critical Care performed: No Procedures MEDICATIONS ORDERED IN ED: Medications  asenapine (SAPHRIS) sublingual tablet 5 mg (has no administration in time range)  haloperidol lactate (HALDOL) injection 5 mg (5 mg Intramuscular Given 08/07/22 0923)  OLANZapine zydis (ZYPREXA) disintegrating tablet 10 mg (10 mg Oral Given 08/07/22 0922)  LORazepam (ATIVAN) injection 2 mg (2 mg Intramuscular Given 08/07/22 1114)  diphenhydrAMINE (BENADRYL) injection 50 mg (50 mg Intramuscular Given 08/07/22 0954)   IMPRESSION / MDM / ASSESSMENT AND PLAN / ED COURSE  I reviewed the triage vital signs and the nursing notes.                             The patient is on the cardiac monitor to evaluate for evidence of arrhythmia and/or significant heart rate changes. Patient's presentation is most consistent with acute presentation with potential threat to life or bodily function. Patient presents requiring IVC for hallucinations/delusions. Thoughts are disorganized. No history of prior suicide attempt, and no SI or HI at this time. Clinically w/ no overt toxidrome, low suspicion for ingestion given hx and exam Thoughts unlikely 2/2 anemia, hypothyroidism, infection, or ICH. Patients decision making capacity is compromised and they are unable to perform all ADLs (additionally they are without appropriate caretakers to assist through this deficit).  Consult: Psychiatry to evaluate patient for grave disability Disposition: Pending psychiatric evaluation  Care of this patient will be signed out the oncoming physician.  All pertinent patient formation is conveyed and all questions answered.  All further care and disposition decisions will be made by the oncoming physician.   FINAL CLINICAL IMPRESSION(S) / ED DIAGNOSES   Final diagnoses:  Altered mental status,  unspecified altered mental status type   Rx / DC Orders   ED Discharge Orders     None      Note:  This document was prepared using Dragon voice recognition software and may include unintentional dictation errors.   Naaman Plummer, MD 08/07/22 [phone]

## 2022-08-07 NOTE — ED Provider Triage Note (Signed)
Emergency Medicine Provider Triage Evaluation Note  Yvonne Pitts , a 66 y.o. female  was evaluated in triage.  Pt complains of AMS per husband.  In the past has had UTIs.   Admission [name] 06/2022 for AMS but husband states she never got completely better.  Husband wants consult for social and psychiatry.  Review of Systems  Positive:           talking in complete sentences.   Negative: No obvious shortness of breath.  Physical Exam  BP (!) 151/72 (BP Location: Left Arm)   Pulse 69   Temp 98.3 F (36.8 C) (Oral)   Resp 16   Ht '5\' 4"'$  (1.626 m)   Wt 53.5 kg   SpO2 97%   BMI 20.25 kg/m  Gen:   Awake, no distress   Resp:  Normal effort  MSK:   Moves extremities without difficulty  Other:    Medical Decision Making  Medically screening exam initiated at 7:25 AM.  Appropriate orders placed.  Sanoe Hazan Barwick was informed that the remainder of the evaluation will be completed by another provider, this initial triage assessment does not replace that evaluation, and the importance of remaining in the ED until their evaluation is complete.     Johnn Hai, PA-C 08/07/22 [phone]

## 2022-08-07 NOTE — ED Triage Notes (Signed)
EMS brings pt in from home for AMS and agitation

## 2022-08-07 NOTE — ED Provider Notes (Signed)
Discussed in detail with thigh.  Patient has had multiple test to evaluate her.  Apparently the autoimmune test on CSF came back negative.  Dr. Weber Cooks had suggested treating her like mania.  Will try some Saphris nightly and see if this helps.   Nena Polio, MD 08/07/22 1228

## 2022-08-07 NOTE — Consult Note (Addendum)
[facility] Consult   Reason for Consult: Altered mental status/abnormal behavior  Referring Physician: Proper Patient Identification: CURTISHA BENDIX MRN:  [id number] Principal Diagnosis: Abnormal behavior Diagnosis:  Principal Problem:   Abnormal behavior   Total Time spent with patient: 1 hour  Subjective:   Yvonne Pitts is a 66 y.o. female patient admitted with abnormal behavior.  HPI: Patient presents to the emergency department with decline in her mental status.  Patient was admitted to the hospital with thorough workup last month.  Patient's spouse tells Probation officer that she did okay for about 1 day after she was discharged.  Patient has presented to the emergency department 3 times with this decline in her mental status which waxes and wanes to some extent per his report.  Patient's patient states that she has not been sleeping well, talking fast, saying the same things over and over.  He states she does okay until about mid day and then this starts.  Husband states "she is not thinking straight."  Patient has not had any change in her medications since she left the hospital.  Husband reports that they had a consult referral and for psychiatry outpatient to do neurocognitive testing but has not been completed as of yet.  On evaluation, patient is very tangential.  She laughs inappropriately.  When I asked her where she is, she will only state "you know."  She is generally making nonsensical statements.  She does say that she lives with her husband and they have been married for 94 years.  She cannot tell me why she is here.  She seems to be a bit hyper religious.  She denies auditory or visual hallucinations.  Denies suicidal ideation.    Patient was given Benadryl, Haldol, and Zyprexa prior to this evaluation.  Patient continued to try to leave and she was given Ativan.  At 1230 she is currently asleep.  Past Psychiatric History: Per note of 7/21, Dr. Weber Cooks: "Patient  reports a past history of anxiety.  Says that she saw a therapist in the past.  No previous hospitalization.  No suicide attempts.  She appears to be on citalopram which has been present for a couple of years possibly for anxiety or depression.  Low-dose Seroquel was also started a couple weeks ago possibly for sleep concerns."  Risk to Self:   Risk to Others Prior Inpatient Therapy:   Prior Outpatient Therapy:    Past Medical History:  Past Medical History:  Diagnosis Date   COPD (chronic obstructive pulmonary disease) ([place]) 11/03/2016   DDD (degenerative disc disease), lumbar    Polycythemia    Ruptured disc, cervical    Scoliosis     Past Surgical History:  Procedure Laterality Date   BREAST EXCISIONAL BIOPSY Right    years ago   DILATION AND CURETTAGE OF UTERUS     NECK SURGERY     Family History:  Family History  Problem Relation Age of Onset   Breast cancer Maternal Aunt 84   Bone cancer Paternal Aunt    Colon cancer Maternal Aunt    Family Psychiatric  History: Sister, depression Social History:  Social History   Substance and Sexual Activity  Alcohol Use Not Currently   Comment: seldom maybe 2-3 times a year     Social History   Substance and Sexual Activity  Drug Use No    Social History   Socioeconomic History   Marital status: Married    Spouse name: Not on file  Number of children: Not on file   Years of education: Not on file   Highest education level: Not on file  Occupational History   Not on file  Tobacco Use   Smoking status: Every Day    Packs/day: 2.00    Years: 43.00    Total pack years: 86.00    Types: Cigarettes   Smokeless tobacco: Never  Vaping Use   Vaping Use: Never used  Substance and Sexual Activity   Alcohol use: Not Currently    Comment: seldom maybe 2-3 times a year   Drug use: No   Sexual activity: Not Currently  Other Topics Concern   Not on file  Social History Narrative   Not on file   Social Determinants of  Health   Financial Resource Strain: Not on file  Food Insecurity: Not on file  Transportation Needs: Not on file  Physical Activity: Not on file  Stress: Not on file  Social Connections: Not on file   Additional Social History:    Allergies:   Allergies  Allergen Reactions   Prochlorperazine Maleate     Other reaction(s): Unknown   Ibuprofen Rash    Labs:  Results for orders placed or performed during the hospital encounter of 08/07/22 (from the past 48 hour(s))  CBC with Differential     Status: None   Collection Time: 08/07/22  7:28 AM  Result Value Ref Range   WBC 9.9 4.0 - 10.5 K/uL   RBC 4.36 3.87 - 5.11 MIL/uL   Hemoglobin 13.9 12.0 - 15.0 g/dL   HCT 42.4 36.0 - 46.0 %   MCV 97.2 80.0 - 100.0 fL   MCH 31.9 26.0 - 34.0 pg   MCHC 32.8 30.0 - 36.0 g/dL   RDW 13.3 11.5 - 15.5 %   Platelets 363 150 - 400 K/uL   nRBC 0.0 0.0 - 0.2 %   Neutrophils Relative % 76 %   Neutro Abs 7.5 1.7 - 7.7 K/uL   Lymphocytes Relative 16 %   Lymphs Abs 1.6 0.7 - 4.0 K/uL   Monocytes Relative 6 %   Monocytes Absolute 0.5 0.1 - 1.0 K/uL   Eosinophils Relative 1 %   Eosinophils Absolute 0.1 0.0 - 0.5 K/uL   Basophils Relative 0 %   Basophils Absolute 0.0 0.0 - 0.1 K/uL   Immature Granulocytes 1 %   Abs Immature Granulocytes 0.05 0.00 - 0.07 K/uL    Comment: Performed at [facility], [address] [place],  [address]  Comprehensive metabolic panel     Status: Abnormal   Collection Time: 08/07/22  7:28 AM  Result Value Ref Range   Sodium 139 135 - 145 mmol/L   Potassium 3.5 3.5 - 5.1 mmol/L   Chloride 107 98 - 111 mmol/L   CO2 26 22 - 32 mmol/L   Glucose, Bld 105 (H) 70 - 99 mg/dL    Comment: Glucose reference range applies only to samples taken after fasting for at least 8 hours.   BUN 8 8 - 23 mg/dL   Creatinine, Ser 0.44 0.44 - 1.00 mg/dL   Calcium 9.2 8.9 - 10.3 mg/dL   Total Protein 7.0 6.5 - 8.1 g/dL   Albumin 4.0 3.5 - 5.0 g/dL   AST 17 15 - 41 U/L    ALT 12 0 - 44 U/L   Alkaline Phosphatase 49 38 - 126 U/L   Total Bilirubin 0.6 0.3 - 1.2 mg/dL   GFR, Estimated >60 >60 mL/min  Comment: (NOTE) Calculated using the CKD-EPI Creatinine Equation (2021)    Anion gap 6 5 - 15    Comment: Performed at [facility], [place], [place] [address]  Troponin I (High Sensitivity)     Status: None   Collection Time: 08/07/22  7:28 AM  Result Value Ref Range   Troponin I (High Sensitivity) 5 <18 ng/L    Comment: (NOTE) Elevated high sensitivity troponin I (hsTnI) values and significant  changes across serial measurements may suggest ACS but many other  chronic and acute conditions are known to elevate hsTnI results.  Refer to the "Links" section for chest pain algorithms and additional  guidance. Performed at [facility], [address] [place], [place] [address]   Ammonia     Status: None   Collection Time: 08/07/22  7:28 AM  Result Value Ref Range   Ammonia <10 9 - 35 umol/L    Comment: Performed at [facility], [address] [place],  [address]  Urinalysis, Routine w reflex microscopic     Status: Abnormal   Collection Time: 08/07/22  7:40 AM  Result Value Ref Range   Color, Urine AMBER (A) YELLOW    Comment: BIOCHEMICALS MAY BE AFFECTED BY COLOR   APPearance CLEAR (A) CLEAR   Specific Gravity, Urine 1.004 (L) 1.005 - 1.030   pH 6.0 5.0 - 8.0   Glucose, UA NEGATIVE NEGATIVE mg/dL   Hgb urine dipstick NEGATIVE NEGATIVE   Bilirubin Urine NEGATIVE NEGATIVE   Ketones, ur NEGATIVE NEGATIVE mg/dL   Protein, ur NEGATIVE NEGATIVE mg/dL   Nitrite POSITIVE (A) NEGATIVE   Leukocytes,Ua NEGATIVE NEGATIVE   RBC / HPF 0-5 0 - 5 RBC/hpf   WBC, UA 0-5 0 - 5 WBC/hpf   Bacteria, UA NONE SEEN NONE SEEN   Squamous Epithelial / LPF 0-5 0 - 5   Mucus PRESENT     Comment: Performed at [facility], [address] [place],  [address]  Urine Drug Screen, Qualitative     Status:  None   Collection Time: 08/07/22  7:40 AM  Result Value Ref Range   Tricyclic, Ur Screen NONE DETECTED NONE DETECTED   Amphetamines, Ur Screen NONE DETECTED NONE DETECTED   MDMA (Ecstasy)Ur Screen NONE DETECTED NONE DETECTED   Cocaine Metabolite,Ur [place] NONE DETECTED NONE DETECTED   Opiate, Ur Screen NONE DETECTED NONE DETECTED   Phencyclidine (PCP) Ur S NONE DETECTED NONE DETECTED   Cannabinoid 50 Ng, Ur [place] NONE DETECTED NONE DETECTED   Barbiturates, Ur Screen NONE DETECTED NONE DETECTED   Benzodiazepine, Ur Scrn NONE DETECTED NONE DETECTED   Methadone Scn, Ur NONE DETECTED NONE DETECTED    Comment: (NOTE) Tricyclics + metabolites, urine    Cutoff 1000 ng/mL Amphetamines + metabolites, urine  Cutoff 1000 ng/mL MDMA (Ecstasy), urine              Cutoff 500 ng/mL Cocaine Metabolite, urine          Cutoff 300 ng/mL Opiate + metabolites, urine        Cutoff 300 ng/mL Phencyclidine (PCP), urine         Cutoff 25 ng/mL Cannabinoid, urine                 Cutoff 50 ng/mL Barbiturates + metabolites, urine  Cutoff 200 ng/mL Benzodiazepine, urine              Cutoff 200 ng/mL Methadone, urine  Cutoff 300 ng/mL  The urine drug screen provides only a preliminary, unconfirmed analytical test result and should not be used for non-medical purposes. Clinical consideration and professional judgment should be applied to any positive drug screen result due to possible interfering substances. A more specific alternate chemical method must be used in order to obtain a confirmed analytical result. Gas chromatography / mass spectrometry (GC/MS) is the preferred confirm atory method. Performed at [facility], [address] [place], [place] [address]   Ethanol     Status: None   Collection Time: 08/07/22  8:29 AM  Result Value Ref Range   Alcohol, Ethyl (B) <10 <10 mg/dL    Comment: (NOTE) Lowest detectable limit for serum alcohol is 10 mg/dL.  For medical purposes  only. Performed at [facility], [address] [place], [place] [address]   Resp Panel by RT-PCR (Flu A&B, Covid) Anterior Nasal Swab     Status: None   Collection Time: 08/07/22 11:49 AM   Specimen: Anterior Nasal Swab  Result Value Ref Range   SARS Coronavirus 2 by RT PCR NEGATIVE NEGATIVE    Comment: (NOTE) SARS-CoV-2 target nucleic acids are NOT DETECTED.  The SARS-CoV-2 RNA is generally detectable in upper respiratory specimens during the acute phase of infection. The lowest concentration of SARS-CoV-2 viral copies this assay can detect is 138 copies/mL. A negative result does not preclude SARS-Cov-2 infection and should not be used as the sole basis for treatment or other patient management decisions. A negative result may occur with  improper specimen collection/handling, submission of specimen other than nasopharyngeal swab, presence of viral mutation(s) within the areas targeted by this assay, and inadequate number of viral copies(<138 copies/mL). A negative result must be combined with clinical observations, patient history, and epidemiological information. The expected result is Negative.  Fact Sheet for Patients:  EntrepreneurPulse.com.au  Fact Sheet for Healthcare Providers:  IncredibleEmployment.be  This test is no t yet approved or cleared by the [place] FDA and  has been authorized for detection and/or diagnosis of SARS-CoV-2 by FDA under an Emergency Use Authorization (EUA). This EUA will remain  in effect (meaning this test can be used) for the duration of the COVID-19 declaration under Section 564(b)(1) of the Act, 21 U.S.C.section 360bbb-3(b)(1), unless the authorization is terminated  or revoked sooner.       Influenza A by PCR NEGATIVE NEGATIVE   Influenza B by PCR NEGATIVE NEGATIVE    Comment: (NOTE) The Xpert Xpress SARS-CoV-2/FLU/RSV plus assay is intended as an aid in the diagnosis of influenza  from Nasopharyngeal swab specimens and should not be used as a sole basis for treatment. Nasal washings and aspirates are unacceptable for Xpert Xpress SARS-CoV-2/FLU/RSV testing.  Fact Sheet for Patients: EntrepreneurPulse.com.au  Fact Sheet for Healthcare Providers: IncredibleEmployment.be  This test is not yet approved or cleared by the [place] FDA and has been authorized for detection and/or diagnosis of SARS-CoV-2 by FDA under an Emergency Use Authorization (EUA). This EUA will remain in effect (meaning this test can be used) for the duration of the COVID-19 declaration under Section 564(b)(1) of the Act, 21 U.S.C. section 360bbb-3(b)(1), unless the authorization is terminated or revoked.  Performed at [facility], [address] [place], [place] [address]     Current Facility-Administered Medications  Medication Dose Route Frequency Provider Last Rate Last Admin   asenapine (SAPHRIS) sublingual tablet 5 mg  5 mg Sublingual QHS Nena Polio, MD  Current Outpatient Medications  Medication Sig Dispense Refill   Albuterol Sulfate (PROAIR RESPICLICK) 846 (90 Base) MCG/ACT AEPB Inhale 2 puffs into the lungs every 6 (six) hours as needed. 2 each 3   amLODipine (NORVASC) 2.5 MG tablet Take 2.5 mg by mouth daily.     asenapine (SAPHRIS) 5 MG SUBL 24 hr tablet Place 1 tablet (5 mg total) under the tongue daily as needed for up to 14 days (for sleep IF the Ativan does not work). 14 tablet 0   budesonide-formoterol (SYMBICORT) 160-4.5 MCG/ACT inhaler INHALE 2 INHALATIONS INTO THE LUNGS 2 TIMES DAILY     citalopram (CELEXA) 20 MG tablet Take 20 mg by mouth daily.     [employer] B-12 500 MCG tablet Take 500 mcg by mouth daily.     cyanocobalamin 1000 MCG tablet Take 1 tablet (1,000 mcg total) by mouth daily. 30 tablet 3   cyclobenzaprine (FLEXERIL) 5 MG tablet Take 5 mg by mouth 3 (three) times daily as needed for muscle spasms.      lamoTRIgine (LAMICTAL) 25 MG tablet Take by mouth.     LORazepam (ATIVAN) 1 MG tablet Take 1 tablet (1 mg total) by mouth at bedtime as needed for sleep. 30 tablet 0   omeprazole (PRILOSEC) 20 MG capsule Take 20 mg by mouth daily.     QUEtiapine (SEROQUEL) 25 MG tablet Take 25 mg by mouth at bedtime.     acetaminophen (TYLENOL) 500 MG tablet Take 500 mg by mouth daily.      Melatonin 5 MG TABS Take 1 tablet by mouth daily.      Respiratory Therapy Supplies (FLUTTER) DEVI Use as directed 1 each 0    Musculoskeletal: Strength & Muscle Tone: within normal limits Gait & Station: normal Patient leans: N/A  Psychiatric Specialty Exam:  Presentation  General Appearance: Bizarre  Eye Contact:Good  Speech:Clear and Coherent  Speech Volume:Normal  Handedness:No data recorded  Mood and Affect  Mood:Anxious; Labile; Euphoric  Affect:Congruent   Thought Process  Thought Processes:Disorganized  Descriptions of Associations:Loose  Orientation:Partial  Thought Content:Illogical  History of Schizophrenia/Schizoaffective disorder:No data recorded Duration of Psychotic Symptoms:No data recorded Hallucinations:Hallucinations: None  Ideas of Reference:None  Suicidal Thoughts:Suicidal Thoughts: No  Homicidal Thoughts:Homicidal Thoughts: No   Sensorium  Memory:Immediate Poor; Recent Poor  Judgment:Impaired  Insight:Lacking   Executive Functions  Concentration:Poor  Attention Span:Poor  Recall:Poor  Fund of Knowledge:Poor  Language:Poor   Psychomotor Activity  Psychomotor Activity:Psychomotor Activity: Increased   Assets  Assets:Resilience; Social Support; Housing; Financial Resources/Insurance   Sleep  Sleep:Sleep: Poor   Physical Exam: Physical Exam Vitals and nursing note reviewed.  HENT:     Head: Normocephalic.     Nose: No congestion or rhinorrhea.  Eyes:     General:        Right eye: No discharge.        Left eye: No discharge.   Cardiovascular:     Rate and Rhythm: Normal rate.  Pulmonary:     Effort: Pulmonary effort is normal.  Musculoskeletal:        General: Normal range of motion.     Cervical back: Normal range of motion.  Skin:    General: Skin is dry.  Neurological:     Mental Status: She is alert.  Psychiatric:        Attention and Perception: She is inattentive.        Mood and Affect: Affect is inappropriate.        Speech: Speech  is tangential.        Behavior: Behavior is uncooperative and hyperactive.        Thought Content: Thought content is paranoid and delusional. Thought content does not include homicidal or suicidal ideation.        Cognition and Memory: Cognition is impaired. Memory is impaired.        Judgment: Judgment is impulsive.    Review of Systems  Constitutional: Negative.   HENT: Negative.    Eyes: Negative.   Respiratory: Negative.    Cardiovascular: Negative.   Psychiatric/Behavioral:  The patient has insomnia.        Altered mental status, manic behaviors   Blood pressure (!) 146/58, pulse 61, temperature 98.3 F (36.8 C), temperature source Oral, resp. rate 16, height '5\' 4"'$  (1.626 m), weight 53.5 kg, SpO2 98 %. Body mass index is 20.25 kg/m.  Treatment Plan Summary: Patient appears to be having a manic episode.  Ativan given.  Ordered Saphris 5 mg nightly for sleep and mania. Refer to inpatient psychiatry.  Reviewed with EDP  Disposition: Refer to inpatient psychiatry  Sherlon Handing, NP 08/07/2022 1:22 PM

## 2022-08-07 NOTE — ED Notes (Signed)
This tech and Faith, EDT into rm to change pt, pt had an accident on pants. Clean sheets on bed and new brief on pt at this time.

## 2022-08-07 NOTE — ED Notes (Signed)
[facility]  CONSULT  DONE  PENDING  PLACEMENT 

## 2022-08-07 NOTE — ED Triage Notes (Signed)
Presents with some AMS  swelling to BLE  Family thinks she may UTI also

## 2022-08-07 NOTE — ED Notes (Signed)
Patient getting more restless at this time and wandering the halls. Pt encouraged to stay in her room and PRN anxiety medication to be given for patient comfort

## 2022-08-07 NOTE — BH Assessment (Signed)
Comprehensive Clinical Assessment (CCA) Note  08/07/2022 Yvonne Pitts [id number]  Chief Complaint:  Chief Complaint  Patient presents with   Altered Mental Status   Visit Diagnosis: Abnormal Behavior    CCA Screening, Triage and Referral (STR)  Patient Reported Information How did you hear about [place]? Family/Friend  What Is the Reason for Your Visit/Call Today? Family concern the patient have been confused and not herself.  How Long Has This Been Causing You Problems? 1 wk - 1 month  What Do You Feel Would Help You the Most Today? Treatment for Depression or other mood problem   Have You Recently Had Any Thoughts About Hurting Yourself? No  Are You Planning to Commit Suicide/Harm Yourself At This time? No   Have you Recently Had Thoughts About [place]? No  Are You Planning to Harm Someone at This Time? No  Explanation: No data recorded  Have You Used Any Alcohol or Drugs in the Past 24 Hours? No  How Long Ago Did You Use Drugs or Alcohol? No data recorded What Did You Use and How Much? No data recorded  Do You Currently Have a Therapist/Psychiatrist? No  Name of Therapist/Psychiatrist: No data recorded  Have You Been Recently Discharged From Any Office Practice or Programs? No  Explanation of Discharge From Practice/Program: No data recorded    CCA Screening Triage Referral Assessment Type of Contact: Face-to-Face  Telemedicine Service Delivery:   Is this Initial or Reassessment? No data recorded Date Telepsych consult ordered in [facility]:  No data recorded Time Telepsych consult ordered in [facility]:  No data recorded Location of Assessment: [facility] ED  Provider Location: [facility] ED   Collateral Involvement: No data recorded  Does Patient Have a [place]? No data recorded Name and Contact of Legal Guardian: No data recorded If Minor and Not Living with Parent(s), Who has Custody? No data recorded Is CPS involved or ever been  involved? Never  Is APS involved or ever been involved? Never   Patient Determined To Be At Risk for Harm To Self or Others Based on Review of Patient Reported Information or Presenting Complaint? No  Method: No data recorded Availability of Means: No data recorded Intent: No data recorded Notification Required: No data recorded Additional Information for Danger to Others Potential: No data recorded Additional Comments for Danger to Others Potential: No data recorded Are There Guns or Other Weapons in Your Home? No data recorded Types of Guns/Weapons: No data recorded Are These Weapons Safely Secured?                            No data recorded Who Could Verify You Are Able To Have These Secured: No data recorded Do You Have any Outstanding Charges, Pending Court Dates, Parole/Probation? No data recorded Contacted To Inform of Risk of Harm To Self or Others: No data recorded   Does Patient Present under Involuntary Commitment? Yes  IVC Papers Initial File Date: 08/07/22   [place] of Residence: [place]   Patient Currently Receiving the Following Services: Not Receiving Services   Determination of Need: Emergent (2 hours)   Options For Referral: ED Visit; Inpatient Hospitalization     CCA Biopsychosocial Patient Reported Schizophrenia/Schizoaffective Diagnosis in Past: No   Strengths: Have stable housing, she's polite, and have a support system.   Mental Health Symptoms Depression:   Difficulty Concentrating   Duration of Depressive symptoms:  Duration of Depressive Symptoms: Greater  than two weeks   Mania:   Change in energy/activity; Racing thoughts   Anxiety:    N/A   Psychosis:   None   Duration of Psychotic symptoms:    Trauma:   N/A   Obsessions:   N/A   Compulsions:   N/A   Inattention:   N/A   Hyperactivity/Impulsivity:   N/A   Oppositional/Defiant Behaviors:   N/A   Emotional Irregularity:   N/A   Other Mood/Personality  Symptoms:  No data recorded   Mental Status Exam Appearance and self-care  Stature:   Average   Weight:   Average weight   Clothing:   Neat/clean; Age-appropriate   Grooming:   Normal   Cosmetic use:   None   Posture/gait:   Normal   Motor activity:   -- (within normal range)   Sensorium  Attention:   Distractible; Confused   Concentration:   Focuses on irrelevancies; Preoccupied; Scattered   Orientation:   Person; Place; Object; Time   Recall/memory:   Defective in Immediate; Defective in Recent   Affect and Mood  Affect:   Anxious; Labile   Mood:   Anxious   Relating  Eye contact:   Fleeting   Facial expression:   Anxious; Responsive   Attitude toward examiner:   Cooperative; Suspicious   Thought and Language  Speech flow:  Flight of Ideas   Thought content:   Ideas of Reference   Preoccupation:   Religion   Hallucinations:   None   Organization:  No data recorded  [employer] of Knowledge:   Fair   Intelligence:   Average   Abstraction:   Overly abstract   Judgement:   Impaired   Reality Testing:   Distorted   Insight:   Flashes of insight   Decision Making:   Confused   Social Functioning  Social Maturity:   Irresponsible   Social Judgement:   Impropriety   Stress  Stressors:   Other (Comment)   Coping Ability:   Overwhelmed   Skill Deficits:   Interpersonal; Self-care   Supports:   Family; Friends/Service system     Religion: Religion/Spirituality Are You A Religious Person?: Yes  Leisure/Recreation: Leisure / Recreation Do You Have Hobbies?: No  Exercise/Diet: Exercise/Diet Do You Exercise?: No Have You Gained or Lost A Significant Amount of Weight in the Past Six Months?: No Do You Follow a Special Diet?: No Do You Have Any Trouble Sleeping?: No   CCA Employment/Education Employment/Work Situation: Employment / Work Engineer, 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  Current Illness: No Has Patient ever Been in the [employer]?: No  Education: Education Is Patient Currently Attending School?: No Did You Have An Individualized Education Program (IIEP): No Did You Have Any Difficulty At [employer]?: No Patient's Education Has Been Impacted by Current Illness: No   CCA Family/Childhood History Family and Relationship History: Family history Marital status: Married Does patient have children?: Yes How many children?: 3 How is patient's relationship with their children?: States it's good  Childhood History:  Childhood History By whom was/is the patient raised?: Both parents Did patient suffer any verbal/emotional/physical/sexual abuse as a child?: No Did patient suffer from severe childhood neglect?: No Has patient ever been sexually abused/assaulted/raped as an adolescent or adult?: No Was the patient ever a victim of a crime or a disaster?: No Witnessed domestic violence?: No Has patient been affected by domestic violence as an adult?: No  Child/Adolescent Assessment:     CCA  Substance Use Alcohol/Drug Use: Alcohol / Drug Use Pain Medications: See PTA Prescriptions: See PTA Over the Counter: See PTA History of alcohol / drug use?: No history of alcohol / drug abuse Longest period of sobriety (when/how long): n/a                         ASAM's:  Six Dimensions of Multidimensional Assessment  Dimension 1:  Acute Intoxication and/or Withdrawal Potential:      Dimension 2:  Biomedical Conditions and Complications:      Dimension 3:  Emotional, Behavioral, or Cognitive Conditions and Complications:     Dimension 4:  Readiness to Change:     Dimension 5:  Relapse, Continued use, or Continued Problem Potential:     Dimension 6:  Recovery/Living Environment:     ASAM Severity Score:    ASAM Recommended Level of Treatment:     Substance use Disorder (SUD)    Recommendations for Services/Supports/Treatments:    Discharge  Disposition:    DSM5 Diagnoses: Patient Active Problem List   Diagnosis Date Noted   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   Leukocytosis 07/22/2022   Depression 07/19/2022   Tobacco abuse 07/19/2022   Abnormal behavior 07/18/2022   GERD (gastroesophageal reflux disease) 07/18/2022   Essential hypertension 07/18/2022   Insomnia 07/18/2022   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/03/2020   Centrilobular emphysema ([place]) 03/22/2019   Varicose veins of leg with pain 11/03/2016   Chronic venous insufficiency 11/03/2016   COPD, moderate ([place]) 11/03/2016   Stenosis of cervical spine 09/24/2015   DDD (degenerative disc disease), cervical 09/24/2015     Referrals to Alternative Service(s): Referred to Alternative Service(s):   Place:   Date:   Time:    Referred to Alternative Service(s):   Place:   Date:   Time:    Referred to Alternative Service(s):   Place:   Date:   Time:    Referred to Alternative Service(s):   Place:   Date:   Time:     Gunnar Fusi MS, LCAS, [facility], [facility] Therapeutic Triage Specialist 08/07/2022 6:59 PM

## 2022-08-07 NOTE — BH Assessment (Signed)
Per charge nurse Kara Dies, Dr. Louis Meckel can review in the AM due to staffing concerns and pt having elopement attempts.

## 2022-08-07 NOTE — ED Notes (Signed)
Pt dressed out into hospital attire. Pt's belongings to include: 1 white underwear 1 red white shirt 1 black shorts 2 black shoes 1 gray ring 2 yellow rings

## 2022-08-07 NOTE — ED Notes (Signed)
Pt sleeping. 

## 2022-08-07 NOTE — BH Assessment (Signed)
Referral information for Psychiatric Hospitalization faxed to;   [facility] (Dr. Kumar-[phone]- [phone])  [facility] [phone]),   [facility] [phone]) [phone]  DRosana Hoes([phone],  FLa Cueva([phone],   [phone]  [facility]([phone],   NTyson Babinski([phone],   [facility]([phone]- [phone],   RGrier Rocher([phone]  [phone],   RMayer Camel([phone].  [facility]([phone]

## 2022-08-07 NOTE — BH Assessment (Signed)
Pt spoke with [facility] charge nurse Kara Dies about reviewing pt for admission. Kara Dies agreed to review pt's chart and contact with a decision.

## 2022-08-08 ENCOUNTER — Encounter: Payer: Self-pay | Admitting: Psychiatry

## 2022-08-08 ENCOUNTER — Other Ambulatory Visit: Payer: Self-pay

## 2022-08-08 ENCOUNTER — Inpatient Hospital Stay
Admission: AD | Admit: 2022-08-08 | Discharge: 2022-08-16 | DRG: 885 | Disposition: A | Discharge status: Home / Self Care | Payer: Medicare HMO | Source: Intra-hospital | Attending: Psychiatry | Admitting: Psychiatry

## 2022-08-08 DIAGNOSIS — Z7951 Long term (current) use of inhaled steroids: Secondary | ICD-10-CM | POA: Diagnosis not present

## 2022-08-08 DIAGNOSIS — D751 Secondary polycythemia: Secondary | ICD-10-CM | POA: Diagnosis present

## 2022-08-08 DIAGNOSIS — F29 Unspecified psychosis not due to a substance or known physiological condition: Secondary | ICD-10-CM | POA: Diagnosis present

## 2022-08-08 DIAGNOSIS — Z886 Allergy status to analgesic agent status: Secondary | ICD-10-CM

## 2022-08-08 DIAGNOSIS — F17210 Nicotine dependence, cigarettes, uncomplicated: Secondary | ICD-10-CM | POA: Diagnosis present

## 2022-08-08 DIAGNOSIS — M5136 Other intervertebral disc degeneration, lumbar region: Secondary | ICD-10-CM | POA: Diagnosis present

## 2022-08-08 DIAGNOSIS — J449 Chronic obstructive pulmonary disease, unspecified: Secondary | ICD-10-CM | POA: Diagnosis present

## 2022-08-08 DIAGNOSIS — Z79899 Other long term (current) drug therapy: Secondary | ICD-10-CM

## 2022-08-08 DIAGNOSIS — N390 Urinary tract infection, site not specified: Secondary | ICD-10-CM | POA: Diagnosis present

## 2022-08-08 DIAGNOSIS — M419 Scoliosis, unspecified: Secondary | ICD-10-CM | POA: Diagnosis present

## 2022-08-08 DIAGNOSIS — F23 Brief psychotic disorder: Secondary | ICD-10-CM | POA: Diagnosis not present

## 2022-08-08 DIAGNOSIS — Z888 Allergy status to other drugs, medicaments and biological substances status: Secondary | ICD-10-CM

## 2022-08-08 DIAGNOSIS — Z800 Family history of malignant neoplasm of digestive organs: Secondary | ICD-10-CM

## 2022-08-08 DIAGNOSIS — R4182 Altered mental status, unspecified: Secondary | ICD-10-CM | POA: Diagnosis not present

## 2022-08-08 DIAGNOSIS — Z803 Family history of malignant neoplasm of breast: Secondary | ICD-10-CM | POA: Diagnosis not present

## 2022-08-08 MED ORDER — ALBUTEROL SULFATE HFA 108 (90 BASE) MCG/ACT IN AERS: 2.0000 | INHALATION_SPRAY | Freq: Four times a day (QID) | RESPIRATORY_TRACT | Status: DC | PRN

## 2022-08-08 MED ORDER — MAGNESIUM HYDROXIDE 400 MG/5ML PO SUSP
30.0000 mL | Freq: Every day | ORAL | Status: DC | PRN
Start: 2022-08-08 — End: 2022-08-16

## 2022-08-08 MED ORDER — VITAMIN B-12 1000 MCG PO TABS
1000.0000 ug | ORAL_TABLET | Freq: Every day | ORAL | Status: DC
  Administered 2022-08-09 – 2022-08-16 (×8): 1000 ug via ORAL
  Filled 2022-08-08 (×8): qty 1

## 2022-08-08 MED ORDER — FOSFOMYCIN TROMETHAMINE 3 G PO PACK: 3.0000 g | PACK | Freq: Once | ORAL | Status: DC

## 2022-08-08 MED ORDER — CITALOPRAM HYDROBROMIDE 20 MG PO TABS
20.0000 mg | ORAL_TABLET | Freq: Every day | ORAL | Status: DC
  Administered 2022-08-09 – 2022-08-16 (×8): 20 mg via ORAL
  Filled 2022-08-08 (×8): qty 1

## 2022-08-08 MED ORDER — MELATONIN 5 MG PO TABS
5.0000 mg | ORAL_TABLET | Freq: Every day | ORAL | Status: DC
  Administered 2022-08-09 – 2022-08-15 (×7): 5 mg via ORAL
  Filled 2022-08-08 (×7): qty 1

## 2022-08-08 MED ORDER — CYCLOBENZAPRINE HCL 10 MG PO TABS
5.0000 mg | ORAL_TABLET | Freq: Three times a day (TID) | ORAL | Status: DC | PRN
Start: 2022-08-08 — End: 2022-08-16

## 2022-08-08 MED ORDER — ASENAPINE MALEATE 5 MG SL SUBL
5.0000 mg | SUBLINGUAL_TABLET | Freq: Every day | SUBLINGUAL | Status: DC
  Administered 2022-08-09 – 2022-08-11 (×3): 5 mg via SUBLINGUAL
  Filled 2022-08-08 (×3): qty 1

## 2022-08-08 MED ORDER — LORAZEPAM 1 MG PO TABS
1.0000 mg | ORAL_TABLET | Freq: Every evening | ORAL | Status: DC | PRN
  Administered 2022-08-12 (×2): 1 mg via ORAL
  Filled 2022-08-08 (×2): qty 1

## 2022-08-08 MED ORDER — ACETAMINOPHEN 500 MG PO TABS
500.0000 mg | ORAL_TABLET | Freq: Every day | ORAL | Status: DC
  Administered 2022-08-09 – 2022-08-14 (×6): 500 mg via ORAL
  Filled 2022-08-08 (×6): qty 1

## 2022-08-08 MED ORDER — AMLODIPINE BESYLATE 5 MG PO TABS
2.5000 mg | ORAL_TABLET | Freq: Every day | ORAL | Status: DC
  Administered 2022-08-09 – 2022-08-15 (×7): 2.5 mg via ORAL
  Filled 2022-08-08 (×7): qty 1

## 2022-08-08 MED ORDER — QUETIAPINE FUMARATE 25 MG PO TABS
25.0000 mg | ORAL_TABLET | Freq: Every day | ORAL | Status: DC
  Administered 2022-08-08 – 2022-08-10 (×3): 25 mg via ORAL
  Filled 2022-08-08 (×3): qty 1

## 2022-08-08 MED ORDER — MOMETASONE FURO-FORMOTEROL FUM 200-5 MCG/ACT IN AERO
2.0000 | INHALATION_SPRAY | Freq: Two times a day (BID) | RESPIRATORY_TRACT | Status: DC
  Administered 2022-08-09 – 2022-08-16 (×15): 2 via RESPIRATORY_TRACT
  Filled 2022-08-08: qty 8.8

## 2022-08-08 MED ORDER — VITAMIN B-12 1000 MCG PO TABS
500.0000 ug | ORAL_TABLET | Freq: Every day | ORAL | Status: DC
  Administered 2022-08-09 – 2022-08-16 (×8): 500 ug via ORAL
  Filled 2022-08-08 (×8): qty 1

## 2022-08-08 MED ORDER — ACETAMINOPHEN 325 MG PO TABS
650.0000 mg | ORAL_TABLET | Freq: Four times a day (QID) | ORAL | Status: DC | PRN
  Administered 2022-08-09 – 2022-08-16 (×6): 650 mg via ORAL
  Filled 2022-08-08 (×7): qty 2

## 2022-08-08 MED ORDER — LAMOTRIGINE 25 MG PO TABS
25.0000 mg | ORAL_TABLET | Freq: Every day | ORAL | Status: DC
  Administered 2022-08-10 – 2022-08-11 (×2): 25 mg via ORAL
  Filled 2022-08-08 (×3): qty 1

## 2022-08-08 MED ORDER — PANTOPRAZOLE SODIUM 40 MG PO TBEC
40.0000 mg | DELAYED_RELEASE_TABLET | Freq: Every day | ORAL | Status: DC
  Administered 2022-08-09 – 2022-08-16 (×8): 40 mg via ORAL
  Filled 2022-08-08 (×8): qty 1

## 2022-08-08 MED ORDER — ALUM & MAG HYDROXIDE-SIMETH 200-200-20 MG/5ML PO SUSP
30.0000 mL | ORAL | Status: DC | PRN
Start: 2022-08-08 — End: 2022-08-16
  Administered 2022-08-10: 30 mL via ORAL
  Filled 2022-08-08: qty 30

## 2022-08-08 MED ORDER — MELATONIN 5 MG PO TABS: 5.0000 mg | ORAL_TABLET | Freq: Every day | ORAL | Status: DC

## 2022-08-08 MED ORDER — ACETAMINOPHEN 500 MG PO TABS
500.0000 mg | ORAL_TABLET | Freq: Once | ORAL | Status: AC
  Administered 2022-08-08: 500 mg via ORAL
  Filled 2022-08-08: qty 1

## 2022-08-08 NOTE — ED Notes (Addendum)
Pt denies any SI or Hi. Denies any auditory and visual hallucinations. Pt states she feels happy at this time. Affect is congruent with mood. Pt alert, calm, and cooperative at this time.

## 2022-08-08 NOTE — Tx Team (Signed)
Initial Treatment Plan 08/08/2022 11:28 PM Yvonne Pitts HKN:[id number]    PATIENT STRESSORS: Other: none     PATIENT STRENGTHS: Supportive family/friends    PATIENT IDENTIFIED PROBLEMS: Patient identified no issues                     DISCHARGE CRITERIA:  Improved stabilization in mood, thinking, and/or behavior  PRELIMINARY DISCHARGE PLAN: Return to previous living arrangement  PATIENT/FAMILY INVOLVEMENT: This treatment plan has been presented to and reviewed with the patient, Yvonne Pitts, and/or family member.  The patient and family have been given the opportunity to ask questions and make suggestions.  Corwin Levins, RN 08/08/2022, 11:28 PM

## 2022-08-08 NOTE — ED Provider Notes (Addendum)
Emergency Medicine Observation Re-evaluation Note  Yvonne Pitts is a 66 y.o. female, seen on rounds today.  Pt initially presented to the ED for complaints of Altered Mental Status Currently, the patient is resting without any acute complaints.  Physical Exam  BP (!) 149/64 (BP Location: Left Arm)   Pulse 69   Temp (!) 97 F (36.1 C) (Oral)   Resp 20   Ht '5\' 4"'$  (1.626 m)   Wt 53.5 kg   SpO2 97%   BMI 20.25 kg/m  Physical Exam General: no acute distress Psych: calm  ED Course / MDM  EKG:   I have reviewed the labs performed to date as well as medications administered while in observation.  Recent changes in the last 24 hours include UA w/ nitrites.  Patient denies any symptoms of this may be a false positive.  Will obtain a urine culture but defer antibiotics at this time.  Plan  Current plan is for inpatient. Yvonne Pitts is under involuntary commitment.      Lucrezia Starch, MD 08/08/22 [id number]    Lucrezia Starch, MD 08/08/22 [id number]    Lucrezia Starch, MD 08/08/22 [phone]

## 2022-08-08 NOTE — ED Provider Notes (Signed)
Patient is medically cleared and free to be admitted to psychiatry.   Nena Polio, MD 08/08/22 1455

## 2022-08-08 NOTE — ED Notes (Signed)
Family here to visit.

## 2022-08-08 NOTE — ED Notes (Signed)
Belongings taken home by husband.

## 2022-08-08 NOTE — ED Notes (Signed)
Pt given drink and tray at this time.

## 2022-08-08 NOTE — ED Notes (Signed)
Meal tray given 

## 2022-08-08 NOTE — ED Notes (Signed)
Pt yelling at staff about leaving. Irritable at this time. Verbally redirected patient to her bed and informed her of plan. Pt states that she "is not crazy, and does not need to be here". Pt stating "you can't make me stay here". Informed of IVC status.

## 2022-08-08 NOTE — ED Notes (Signed)
IVC/pending Geri-Psych admit

## 2022-08-08 NOTE — ED Notes (Addendum)
RN called  MARC, SIVERTSEN [phone]   [phone]  Per pt request. Informed of plan for admission at this time.

## 2022-08-08 NOTE — Group Note (Signed)
[place] LCSW Group Therapy Note   Group Date: 08/08/2022 Start Time:  1:00 PM End Time:  2:00 PM  Type of Therapy and Topic:  Group Therapy:  Feelings around Relapse and Recovery  Participation Level:  Did Not Attend   Mood:  Description of Group:    Patients in this group will discuss emotions they experience before and after a relapse. They will process how experiencing these feelings, or avoidance of experiencing them, relates to having a relapse. Facilitator will guide patients to explore emotions they have related to recovery. Patients will be encouraged to process which emotions are more powerful. They will be guided to discuss the emotional reaction significant others in their lives may have to patients' relapse or recovery. Patients will be assisted in exploring ways to respond to the emotions of others without this contributing to a relapse.  Therapeutic Goals: Patient will identify two or more emotions that lead to relapse for them:  Patient will identify two emotions that result when they relapse:  Patient will identify two emotions related to recovery:  Patient will demonstrate ability to communicate their needs through discussion and/or role plays.   Summary of Patient Progress:  Group unable to be held due to complex discharge planning.   Therapeutic Modalities:   Cognitive Behavioral Therapy Solution-Focused Therapy Assertiveness Training Relapse Prevention Therapy   Rozann Lesches, LCSW

## 2022-08-08 NOTE — ED Notes (Signed)
Report given to Olga, RN in geri-psych.

## 2022-08-08 NOTE — Consult Note (Signed)
  Follow-up psychiatric consult: Patient seen and chart reviewed.  66 year old woman who has been displaying increasingly bizarre agitated behaviors with disorganized thoughts at home which has been occurring for several weeks.  Husband unable to manage her.  Patient has had neurologic workup which so far has been unrevealing.  On interview the patient is awake alert and says she feels better than she has ever felt.  Insight limited.  Denies suicidal or homicidal thought.  Denies hallucinations.  Patient is under IVC and concerns been expressed multiple times by family about safety outside the hospital.  We will recommend admission to the geriatric psychiatry ward.  Orders placed

## 2022-08-09 DIAGNOSIS — F23 Brief psychotic disorder: Secondary | ICD-10-CM | POA: Diagnosis not present

## 2022-08-09 LAB — URINE CULTURE: Culture: <10000 — AB

## 2022-08-09 LAB — HEMOGLOBIN A1C
Hgb A1c MFr Bld: 5.4 % (ref 4.8–5.6)
Mean Plasma Glucose: 108.28 mg/dL

## 2022-08-09 LAB — LIPID PANEL
Cholesterol: 182 mg/dL (ref 0–200)
HDL: 65 mg/dL (ref >40)
LDL Cholesterol: 96 mg/dL (ref 0–99)
Total CHOL/HDL Ratio: 2.8 RATIO
Triglycerides: 106 mg/dL (ref <150)
VLDL: 21 mg/dL (ref 0–40)

## 2022-08-09 MED ORDER — ONDANSETRON HCL 4 MG PO TABS
4.0000 mg | ORAL_TABLET | Freq: Once | ORAL | Status: AC
  Administered 2022-08-09: 4 mg via ORAL
  Filled 2022-08-09: qty 1

## 2022-08-09 NOTE — Progress Notes (Signed)
Patient is alert and oriented x4. Patient has been up in the dayroom talking to peers and staff. She ate her breakfast and took all her medications except for Lamictal. She stated she did not need that one. Dr. Danella Sensing made aware. Pt denies SI/HI, and denies AVH. She presents as very happy and animated. She denies any pain at present. She is cooperative with staff requests and answers questions accordingly. She voiced concerns about wanting to go home today but she was redirected by Dr. Danella Sensing that she will need to be observed for a few days. Patient was upset initially but calmed down and stated she understood. Will continue q72mn checks and provide verbal redirection as needed.

## 2022-08-09 NOTE — Progress Notes (Signed)
Pt requested a spiritual consult with the chaplain, ordered. Notified pt they don't come back until Monday.

## 2022-08-09 NOTE — Progress Notes (Signed)
Patient arrived onto the unit calm and cooperative. She brought no belongings with her. She is AAOx4. She denies pain, anxiety, depression, SI, HI, and AVH. She voiced no stressors leading to her admission. She stated "everything is great at home." Pt is taking medications as ordered. She is no current distress. Q 15 min safety checks in place.

## 2022-08-09 NOTE — BHH Suicide Risk Assessment (Signed)
[facility] Admission Suicide Risk Assessment   Nursing information obtained from:  Patient Demographic factors:  Caucasian Current Mental Status:  NA Loss Factors:  NA Historical Factors:  NA Risk Reduction Factors:  Living with another person, especially a relative, Positive social support, Positive therapeutic relationship   Principal Problem: Psychosis ([place]) Diagnosis:  Principal Problem:   Psychosis ([place])  Subjective Data: patient denies suicidal thoughts, plans.  SUICIDE RISK:   Minimal: No identifiable suicidal ideation.  Patients presenting with no risk factors but with morbid ruminations; may be classified as minimal risk based on the severity of the depressive symptoms  [place] Suicide Severity Rating Scale  Wish to be dead: No  Suicidal thoughts: No                  Suicidal thoughts with method: No                  Suicidal intent: No                  Suicide intent with specific plan: No                  Suicide behavior: No   PLAN OF CARE:  -inpatient psychiatric admission will be continued. -patient will be integrated in the milieu.   -patient will be encouraged to attend groups.  I certify that inpatient services furnished can reasonably be expected to improve the patient's condition.   Larita Fife, MD 08/09/2022, 1:18 PM

## 2022-08-09 NOTE — Plan of Care (Signed)
  Problem: Education: Goal: Knowledge of [facility] Education information/materials will improve Outcome: Progressing Goal: Emotional status will improve Outcome: Progressing Goal: Mental status will improve Outcome: Progressing Goal: Verbalization of understanding the information provided will improve Outcome: Progressing   

## 2022-08-09 NOTE — Progress Notes (Signed)
Patient is seen in the dayroom at the beginning of the shift. She is calm and cooperative. She denies SI, HI, AVH, anxiety and depression. Pt complained of nausea. On call NP notified and Zofran 4 mg ordered. Pt is in no current distress. Q 15 minute safety checks in place.

## 2022-08-09 NOTE — [facility] Counselor (Signed)
Adult Comprehensive Assessment  Patient ID: Yvonne Pitts, female   DOB: October 15, 1956, 66 y.o.   MRN: [id number]  Information Source: Information source: Patient  Current Stressors:  Patient states their primary concerns and needs for treatment are:: "I was sick. I got sick. Went to [facility] and I got worse." She shares that her family took her to the emergency room then returned home with medication for her to take. Pt shares that she feels the medication made her crazy. She acknowledges that she has a sinus infection and UTI. Patient states their goals for this hospitilization and ongoing recovery are:: "I don't know. See I love to read but I haven't been able to do that in so long. Made some friends, I just want to enjoy my time." Educational / Learning stressors: None reported Employment / Job issues: Pt is retired Family Relationships: None reported Museum/gallery curator / Lack of resources (include bankruptcy): None reported Housing / Lack of housing: None reported Physical health (include injuries & life threatening diseases): Recent sinus infection and UTI Social relationships: None reported Substance abuse: None reported Bereavement / Loss: None reported  Living/Environment/Situation:  Living Arrangements: Children, Spouse/significant other Living conditions (as described by patient or guardian): "Good, in fact it's better than it's ever been." Who else lives in the home?: Pt, her husband, and their son. How long has patient lived in current situation?: "55 years" What is atmosphere in current home: Comfortable, Loving, Supportive  Family History:  Marital status: Married Number of Years Married: 14 Additional relationship information: She shares that they dated two years before they got married. Are you sexually active?:  (Not asssessed) What is your sexual orientation?: Not assessed Has your sexual activity been affected by drugs, alcohol, medication, or emotional stress?: Not  assessed Does patient have children?: Yes How many children?: 3 How is patient's relationship with their children?: "I love my children, you better believe it, and my grandson."  Childhood History:  By whom was/is the patient raised?: Both parents Additional childhood history information: She states that her father was an alcoholic and growing up was "hard". Pt was raised by both parents along with her four siblings. Description of patient's relationship with caregiver when they were a child: "Always loved them but we never had any money." She shares that her parents helped raise her children and almost all of her nieces and nephews as well. Patient's description of current relationship with people who raised him/her: Pt's parents are deceased. How were you disciplined when you got in trouble as a child/adolescent?: "Actually I didn't get too much. They raised [place] to do what they said." Does patient have siblings?: Yes Number of Siblings: 4 Description of patient's current relationship with siblings: "I love all my brothers. We're all tight as ticks." Did patient suffer any verbal/emotional/physical/sexual abuse as a child?: No Did patient suffer from severe childhood neglect?: No Has patient ever been sexually abused/assaulted/raped as an adolescent or adult?: No Was the patient ever a victim of a crime or a disaster?: No Witnessed domestic violence?: No Has patient been affected by domestic violence as an adult?: No  Education:  Highest grade of school patient has completed: Some college Currently a Ship broker?: No Learning disability?: No  Employment/Work Situation:   Employment Situation: Retired Social research officer, government has Been Impacted by Current Illness: No What is the Longest Time Patient has Held a Job?: "26 years" Where was the Patient Employed at that Time?: "[employer]" Has Patient ever Been in the [employer]?:  No  Financial Resources:   Financial resources: Medicare Does patient have a  representative payee or guardian?: No  Alcohol/Substance Abuse:   What has been your use of drugs/alcohol within the last 12 months?: Pt denies any substance use. She does report at one time smoking two packs of cigarettes per day, however, she states that she has cut down on her own to about half a pack a day. If attempted suicide, did drugs/alcohol play a role in this?:  (N/A) Alcohol/Substance Abuse Treatment Hx: Denies past history If yes, describe treatment: N/A Has alcohol/substance abuse ever caused legal problems?: No  Social Support System:   Patient's Community Support System: Good Describe Community Support System: "I got all my brothers, sister-in-laws, nephews, husband, son, and my brother-in-law is my best friend." Type of faith/religion: "I'm more religious now than I've ever been." How does patient's faith help to cope with current illness?: Not assessed  Leisure/Recreation:   Do You Have Hobbies?: Yes Leisure and Hobbies: "Read, watch television."  Strengths/Needs:   What is the patient's perception of their strengths?: "I'm good at loving people and taking care of my family. And I can cook, not as good as my mother, but I can cook." Patient states they can use these personal strengths during their treatment to contribute to their recovery: N/A Patient states these barriers may affect/interfere with their treatment: Pt denies any Patient states these barriers may affect their return to the community: Pt denies any Other important information patient would like considered in planning for their treatment: n/A  Discharge Plan:   Currently receiving community mental health services: Yes (From Whom) (Pt receives medication management through her PCP, Dr. Cherre Blanc of the [facility].) Patient states concerns and preferences for aftercare planning are: She states that she would like to continue with her doctor. Patient states they will know when they are safe and ready for  discharge when: "They told me I couldn't go home at least until Monday so I'm good with that." Does patient have access to transportation?: Yes Does patient have financial barriers related to discharge medications?: No Patient description of barriers related to discharge medications: N/A Will patient be returning to same living situation after discharge?: Yes  Summary/Recommendations:   Summary and Recommendations (to be completed by the evaluator): Patient is a 66 year old, married, mother of three adult children [facility] [place] [place] [facility] She shared that she is in the hospital because of a reaction to medication she took for her recent sinus infection and UTI. Pt expressed a goal of enjoying her time here. She lives at home with her husband and son (who is autistic). Pt is retired, has Medicare, and plans to return home upon discharge. Stressors identified as recent UTI and sinus infection. Pt denied any history of trauma, abuse, or substance use. During the interaction, pt is friendly and often states that she knows the CSW has other people to see, and she is taking up too much time. CSW reminded her that he was there to complete her assessment and that she was doing fine. She shared that she gets medication management through her PCP, Dr. Cherre Blanc of [facility] and would like to continue with the post discharge. Recommendations include crisis stabilization, therapeutic milieu, encourage group attendance and participation, medication management for mood stabilization and development of comprehensive mental wellness plan.  Shirl Harris. 08/09/2022

## 2022-08-09 NOTE — H&P (Signed)
Psychiatric Admission Assessment Adult  Patient Identification: Yvonne Pitts MRN:  [id number] Date of Evaluation:  08/09/2022 Chief Complaint:  Psychosis [facility]) [F29] Principal Diagnosis: Psychosis ([place]) Diagnosis:  Principal Problem:   Psychosis ([place])   66 year old woman who without psychiatric history, who was admitted to Geriatric Psychiatry unit due to displaying increasingly bizarre agitated behaviors with disorganized thoughts at home for several weeks.   History of Present Illness:  Patient has no psychiatric history. Reportedly, per Psych Consult Note, she' \\has'$  been displaying increasingly bizarre agitated behaviors with disorganized thoughts at home for several weeks and her family were unable to manage her. Patient has had neurologic workup which so far has been unrevealing. Patient is started on some medications (Saphris, Celexa, Lamictal, Seroquel) in the ER and continued on admission. On interview the patient is awake, alert and denies any mental complaints. She thinks her altered mental status was related to recent UTI, steroids and antibiotics recently prescribed to her "it messed up my thinking". She thinks she is mentally "normal". She denies any mental complaints - denies feeling depressed, anxious, unsafe, denies suicidal or homicidal thoughts, denies hallucinations. She expresses feeling frustrated of being in locked psych ward. She is asking about discharge. She denies any physical complaints. She is cooperative with staff requests and answers questions accordingly. She is upset due to being here, although stated she understood and okay with that.    Past Psychiatric History: Patient has no psychiatric history  [place] Scale:  [facility] Admission (Current) from 08/08/2022 in [place] ED from 08/07/2022 in [place] ED from 07/25/2022 in [place] No Risk No Risk No Risk        Prior Inpatient Therapy:   Prior Outpatient Therapy:    Alcohol Screening: Patient refused Alcohol Screening Tool: Yes 1. How often do you have a drink containing alcohol?: Never 2. How many drinks containing alcohol do you have on a typical day when you are drinking?: 1 or 2 3. How often do you have six or more drinks on one occasion?: Never AUDIT-C Score: 0 9. Have you or someone else been injured as a result of your drinking?: No 10. Has a relative or friend or a doctor or another health worker been concerned about your drinking or suggested you cut down?: No Alcohol Use Disorder Identification Test Final Score (AUDIT): 0 Substance Abuse History in the last 12 months:  No. Consequences of Substance Abuse: NA Previous Psychotropic Medications: No  Psychological Evaluations: No  Past Medical History:  Past Medical History:  Diagnosis Date   COPD (chronic obstructive pulmonary disease) ([place]) 11/03/2016   DDD (degenerative disc disease), lumbar    Polycythemia    Ruptured disc, cervical    Scoliosis     Past Surgical History:  Procedure Laterality Date   BREAST EXCISIONAL BIOPSY Right    years ago   DILATION AND CURETTAGE OF UTERUS     NECK SURGERY     Family History:  Family History  Problem Relation Age of Onset   Breast cancer Maternal Aunt 74   Bone cancer Paternal Aunt    Colon cancer Maternal Aunt    Family Psychiatric  History:  Tobacco Screening:   Social History:  Social History   Substance and Sexual Activity  Alcohol Use Not Currently   Comment: seldom maybe 2-3 times a year     Social History   Substance and Sexual  Activity  Drug Use No    Additional Social History:                           Allergies:   Allergies  Allergen Reactions   Prochlorperazine Maleate     Other reaction(s): Unknown   Ibuprofen Rash   Lab Results:  Results for orders placed or performed during the hospital  encounter of 08/08/22 (from the past 48 hour(s))  Lipid panel     Status: None   Collection Time: 08/09/22  6:40 AM  Result Value Ref Range   Cholesterol 182 0 - 200 mg/dL   Triglycerides 106 <150 mg/dL   HDL 65 >40 mg/dL   Total CHOL/HDL Ratio 2.8 RATIO   VLDL 21 0 - 40 mg/dL   LDL Cholesterol 96 0 - 99 mg/dL    Comment:        Total Cholesterol/HDL:CHD Risk Coronary Heart Disease Risk Table                     Men   Women  1/2 Average Risk   3.4   3.3  Average Risk       5.0   4.4  2 X Average Risk   9.6   7.1  3 X Average Risk  23.4   11.0        Use the calculated Patient Ratio above and the CHD Risk Table to determine the patient's CHD Risk.        ATP III CLASSIFICATION (LDL):  <100     mg/dL   Optimal  100-129  mg/dL   Near or Above                    Optimal  130-159  mg/dL   Borderline  160-189  mg/dL   High  >190     mg/dL   Very High Performed at [facility], [address] [place], [place] [address]     Blood Alcohol level:  Lab Results  Component Value Date   [facility] <10 08/07/2022   ETH <10 64/40/3474    Metabolic Disorder Labs:  No results found for: "HGBA1C", "MPG" No results found for: "PROLACTIN" Lab Results  Component Value Date   CHOL 182 08/09/2022   TRIG 106 08/09/2022   HDL 65 08/09/2022   CHOLHDL 2.8 08/09/2022   VLDL 21 08/09/2022   LDLCALC 96 08/09/2022    Current Medications: Current Facility-Administered Medications  Medication Dose Route Frequency Provider Last Rate Last Admin   acetaminophen (TYLENOL) tablet 500 mg  500 mg Oral Daily Clapacs, Madie Reno, MD   500 mg at 08/09/22 2595   acetaminophen (TYLENOL) tablet 650 mg  650 mg Oral Q6H PRN Clapacs, John T, MD   650 mg at 08/09/22 0422   albuterol (VENTOLIN HFA) 108 (90 Base) MCG/ACT inhaler 2 puff  2 puff Inhalation Q6H PRN Clapacs, John T, MD       alum & mag hydroxide-simeth (MAALOX/MYLANTA) 200-200-20 MG/5ML suspension 30 mL  30 mL Oral Q4H PRN Clapacs, John T, MD        amLODipine (NORVASC) tablet 2.5 mg  2.5 mg Oral Daily Clapacs, John T, MD   2.5 mg at 08/09/22 0939   asenapine (SAPHRIS) sublingual tablet 5 mg  5 mg Sublingual QHS Clapacs, John T, MD       citalopram (CELEXA) tablet 20 mg  20 mg Oral Daily Clapacs, John  T, MD   20 mg at 08/09/22 6237   cyanocobalamin (VITAMIN B12) tablet 1,000 mcg  1,000 mcg Oral Daily Clapacs, John T, MD   1,000 mcg at 08/09/22 6283   cyanocobalamin (VITAMIN B12) tablet 500 mcg  500 mcg Oral Daily Clapacs, Madie Reno, MD   500 mcg at 08/09/22 0940   cyclobenzaprine (FLEXERIL) tablet 5 mg  5 mg Oral TID PRN Clapacs, Madie Reno, MD       lamoTRIgine (LAMICTAL) tablet 25 mg  25 mg Oral Daily Clapacs, John T, MD       LORazepam (ATIVAN) tablet 1 mg  1 mg Oral QHS PRN Clapacs, John T, MD       magnesium hydroxide (MILK OF MAGNESIA) suspension 30 mL  30 mL Oral Daily PRN Clapacs, John T, MD       melatonin tablet 5 mg  5 mg Oral QHS [name], RPH       mometasone-formoterol (DULERA) 200-5 MCG/ACT inhaler 2 puff  2 puff Inhalation BID Clapacs, Madie Reno, MD   2 puff at 08/09/22 0800   pantoprazole (PROTONIX) EC tablet 40 mg  40 mg Oral Daily Clapacs, Madie Reno, MD   40 mg at 08/09/22 0940   QUEtiapine (SEROQUEL) tablet 25 mg  25 mg Oral QHS Clapacs, John T, MD   25 mg at 08/08/22 2334   PTA Medications: Medications Prior to Admission  Medication Sig Dispense Refill Last Dose   acetaminophen (TYLENOL) 500 MG tablet Take 500 mg by mouth daily.       Albuterol Sulfate (PROAIR RESPICLICK) 151 (90 Base) MCG/ACT AEPB Inhale 2 puffs into the lungs every 6 (six) hours as needed. 2 each 3    amLODipine (NORVASC) 2.5 MG tablet Take 2.5 mg by mouth daily.      asenapine (SAPHRIS) 5 MG SUBL 24 hr tablet Place 1 tablet (5 mg total) under the tongue daily as needed for up to 14 days (for sleep IF the Ativan does not work). 14 tablet 0    budesonide-formoterol (SYMBICORT) 160-4.5 MCG/ACT inhaler INHALE 2 INHALATIONS INTO THE LUNGS 2 TIMES DAILY       citalopram (CELEXA) 20 MG tablet Take 20 mg by mouth daily.      [employer] B-12 500 MCG tablet Take 500 mcg by mouth daily.      cyanocobalamin 1000 MCG tablet Take 1 tablet (1,000 mcg total) by mouth daily. 30 tablet 3    cyclobenzaprine (FLEXERIL) 5 MG tablet Take 5 mg by mouth 3 (three) times daily as needed for muscle spasms.      lamoTRIgine (LAMICTAL) 25 MG tablet Take by mouth.      LORazepam (ATIVAN) 1 MG tablet Take 1 tablet (1 mg total) by mouth at bedtime as needed for sleep. 30 tablet 0    Melatonin 5 MG TABS Take 1 tablet by mouth daily.       omeprazole (PRILOSEC) 20 MG capsule Take 20 mg by mouth daily.      QUEtiapine (SEROQUEL) 25 MG tablet Take 25 mg by mouth at bedtime.      Respiratory Therapy Supplies (FLUTTER) DEVI Use as directed 1 each 0     Musculoskeletal: Strength & Muscle Tone: within normal limits Gait & Station: normal Patient leans: N/A            Psychiatric Specialty Exam:  Presentation  General Appearance: Bizarre  Eye Contact:Good  Speech:Clear and Coherent  Speech Volume:Normal  Handedness:No data recorded  Mood and Affect  Mood:Anxious; Labile; Euphoric  Affect:Congruent   Thought Process  Thought Processes:Disorganized  Duration of Psychotic Symptoms: No data recorded Past Diagnosis of Schizophrenia or Psychoactive disorder: No  Descriptions of Associations:Loose  Orientation:Partial  Thought Content:Illogical  Hallucinations:No data recorded Ideas of Reference:None  Suicidal Thoughts:No data recorded Homicidal Thoughts:No data recorded  Sensorium  Memory:Immediate Poor; Recent Poor  Judgment:Impaired  Insight:Lacking   Executive Functions  Concentration:Poor  Attention Span:Poor  Recall:Poor  Fund of Knowledge:Poor  Language:Poor   Psychomotor Activity  Psychomotor Activity:No data recorded  Assets  Assets:Resilience; Social Support; Housing; Financial Resources/Insurance   Sleep  Sleep:No  data recorded   Physical Exam: Physical Exam Constitutional:      Appearance: Normal appearance.  HENT:     Head: Normocephalic and atraumatic.  Eyes:     Extraocular Movements: Extraocular movements intact.     Pupils: Pupils are equal, round, and reactive to light.  Cardiovascular:     Rate and Rhythm: Normal rate and regular rhythm.  Pulmonary:     Effort: Pulmonary effort is normal.     Breath sounds: Normal breath sounds.  Abdominal:     Palpations: Abdomen is soft.  Musculoskeletal:        General: Normal range of motion.     Cervical back: Normal range of motion.  Skin:    General: Skin is warm and dry.  Neurological:     General: No focal deficit present.     Mental Status: She is alert and oriented to person, place, and time.  Psychiatric:        Mood and Affect: Mood normal.        Behavior: Behavior normal.        Thought Content: Thought content normal.    Review of Systems  Constitutional:  Negative for chills and fever.  HENT:  Negative for hearing loss.   Eyes:  Negative for blurred vision.  Respiratory:  Negative for cough and shortness of breath.   Cardiovascular:  Negative for chest pain.  Gastrointestinal:  Negative for abdominal pain.  Neurological:  Negative for tremors and headaches.  Psychiatric/Behavioral:  Negative for depression, hallucinations, substance abuse and suicidal ideas. The patient is nervous/anxious. The patient does not have insomnia.    Blood pressure 136/80, pulse 73, temperature 97.8 F (36.6 C), resp. rate 18, height '5\' 4"'$  (1.626 m), weight 51.3 kg, SpO2 97 %. Body mass index is 19.4 kg/m.  Treatment Plan Summary: Daily contact with patient to assess and evaluate symptoms and progress in treatment and Medication management  ASSESSMENT: Patient is seen and examined.  Patient is a 66 year old female without past psychiatric history who was admitted secondary altered mental status - agitation, bizarre behavior. While in ER,  patient\ started on some medications (Saphris, Celexa, Lamictal, Seroquel) and appears to be improving, at least her behavior today is appropriate, she is fully oriented, does not express any delusions and does not appear internally-stimulated. All the medications will be continued without changes for now.  PLAN: -inpatient psychiatric admission will be continued. -patient will be integrated in the milieu.   -patient will be encouraged to attend groups.    -Medications:   acetaminophen  500 mg Oral Daily   amLODipine  2.5 mg Oral Daily   asenapine  5 mg Sublingual QHS   citalopram  20 mg Oral Daily   cyanocobalamin  1,000 mcg Oral Daily   cyanocobalamin  500 mcg Oral Daily   lamoTRIgine  25 mg Oral Daily   melatonin  5 mg Oral QHS   mometasone-formoterol  2 puff Inhalation BID   pantoprazole  40 mg Oral Daily   QUEtiapine  25 mg Oral QHS    -We will attempt to collect collateral information.  -Disposition will be determined after the patient is stabilized.    Observation Level/Precautions:  15 minute checks  Laboratory:      Psychotherapy:    Medications:    Consultations:    Discharge Concerns:    Estimated LOS:  Other:     Physician Treatment Plan for Primary Diagnosis: Psychosis ([place]) Long Term Goal(s): Improvement in symptoms so as ready for discharge  Short Term Goals: Ability to identify changes in lifestyle to reduce recurrence of condition will improve, Ability to verbalize feelings will improve, Ability to disclose and discuss suicidal ideas, Ability to demonstrate self-control will improve, Ability to identify and develop effective coping behaviors will improve, Ability to maintain clinical measurements within normal limits will improve, Compliance with prescribed medications will improve, and Ability to identify triggers associated with substance abuse/mental health issues will improve  Physician Treatment Plan for Secondary Diagnosis: Principal Problem:    Psychosis ([place])  Long Term Goal(s): Improvement in symptoms so as ready for discharge  Short Term Goals: Ability to identify changes in lifestyle to reduce recurrence of condition will improve, Ability to verbalize feelings will improve, Ability to disclose and discuss suicidal ideas, Ability to demonstrate self-control will improve, Ability to identify and develop effective coping behaviors will improve, Ability to maintain clinical measurements within normal limits will improve, Compliance with prescribed medications will improve, and Ability to identify triggers associated with substance abuse/mental health issues will improve  I certify that inpatient services furnished can reasonably be expected to improve the patient's condition.    Larita Fife, MD 8/12/20231:00 PM

## 2022-08-10 DIAGNOSIS — F23 Brief psychotic disorder: Secondary | ICD-10-CM | POA: Diagnosis not present

## 2022-08-10 NOTE — Progress Notes (Signed)
   08/10/22 1040  Clinical Encounter Type  Visited With Patient  Visit Type Follow-up;Spiritual support;Social support   Chaplain Corbin Ade engaged pt and pt was glad for the visit because this chaplain is already known to her.  Chaplain B provided compassionate, non-anxious presence and words of encouragement.  Pt asked chaplain to engage other pts on unit for a Sunday worship time & that was offered as well.  Chaplain Kemiya Batdorf offered prayer and support & made her ongoing availability known (will be available overnight 8/13-14).

## 2022-08-10 NOTE — Plan of Care (Signed)
Alert, oriented and able to make needs known. Ambulating around unit and talking with staff and peers. Patient is pleasant and complimentary of the staff here. She denies any SI/HI/AVH. She did take her Lamictal this morning but states she does not need a mood stabilizer. Denies any pain at present. She voices that she does not need to be here. Will continue q22mn checks and provide verbal encouragement.     Problem: Health Behavior/Discharge Planning: Goal: Ability to manage health-related needs will improve Outcome: Progressing   Problem: Coping: Goal: Level of anxiety will decrease Outcome: Progressing   Problem: Pain Managment: Goal: General experience of comfort will improve Outcome: Progressing   Problem: Education: Goal: Emotional status will improve Outcome: Progressing Goal: Mental status will improve Outcome: Progressing

## 2022-08-10 NOTE — Progress Notes (Signed)
[facility] MD Progress Note  08/10/2022 11:43 AM Yvonne Pitts  MRN:  [id number]  Principal Problem: Psychosis [facility]) Diagnosis: Principal Problem:   Psychosis ([place])   66 year old woman who without psychiatric history, who was admitted to Geriatric Psychiatry unit due to displaying increasingly bizarre agitated behaviors with disorganized thoughts at home for several weeks.    Interval History Patient was seen today for re-evaluation.  Nursing reports no events overnight. The patient has no issues with performing ADLs.  Patient has been medication compliant.  Patient is pleasant and complimentary of the staff here. She denies any SI/HI/AVH.  Subjective:  On assessment patient reports "I am doing very good. I don`t know what to complain about". She continues to deny any mental complaints - denies feeling depressed, anxious, unsafe, denies suicidal or homicidal thoughts, denies hallucinations. She expresses feeling frustrated of being in locked psych ward and blames her husband for that. She is planning to have a visit with her husband tonight and hopes "he will see I don`t need to be here".  She denies any physical complaints.    Total Time spent with patient: 30 minutes  Past Psychiatric History: Patient has no psychiatric history  Past Medical History:  Past Medical History:  Diagnosis Date   COPD (chronic obstructive pulmonary disease) ([place]) 11/03/2016   DDD (degenerative disc disease), lumbar    Polycythemia    Ruptured disc, cervical    Scoliosis     Past Surgical History:  Procedure Laterality Date   BREAST EXCISIONAL BIOPSY Right    years ago   DILATION AND CURETTAGE OF UTERUS     NECK SURGERY     Family History:  Family History  Problem Relation Age of Onset   Breast cancer Maternal Aunt 66   Bone cancer Paternal Aunt    Colon cancer Maternal Aunt    Family Psychiatric  History:  Social History:  Social History   Substance and Sexual Activity  Alcohol Use Not  Currently   Comment: seldom maybe 2-3 times a year     Social History   Substance and Sexual Activity  Drug Use No    Social History   Socioeconomic History   Marital status: Married    Spouse name: Not on file   Number of children: Not on file   Years of education: Not on file   Highest education level: Not on file  Occupational History   Not on file  Tobacco Use   Smoking status: Every Day    Packs/day: 2.00    Years: 43.00    Total pack years: 86.00    Types: Cigarettes   Smokeless tobacco: Never  Vaping Use   Vaping Use: Never used  Substance and Sexual Activity   Alcohol use: Not Currently    Comment: seldom maybe 2-3 times a year   Drug use: No   Sexual activity: Not Currently  Other Topics Concern   Not on file  Social History Narrative   Not on file   Social Determinants of Health   Financial Resource Strain: Not on file  Food Insecurity: Not on file  Transportation Needs: Not on file  Physical Activity: Not on file  Stress: Not on file  Social Connections: Not on file   Additional Social History:                         Sleep: Fair  Appetite:  Good  Current Medications: Current Facility-Administered Medications  Medication  Dose Route Frequency Provider Last Rate Last Admin   acetaminophen (TYLENOL) tablet 500 mg  500 mg Oral Daily Clapacs, Madie Reno, MD   500 mg at 08/10/22 0786   acetaminophen (TYLENOL) tablet 650 mg  650 mg Oral Q6H PRN Clapacs, John T, MD   650 mg at 08/10/22 0602   albuterol (VENTOLIN HFA) 108 (90 Base) MCG/ACT inhaler 2 puff  2 puff Inhalation Q6H PRN Clapacs, John T, MD       alum & mag hydroxide-simeth (MAALOX/MYLANTA) 200-200-20 MG/5ML suspension 30 mL  30 mL Oral Q4H PRN Clapacs, John T, MD   30 mL at 08/10/22 0602   amLODipine (NORVASC) tablet 2.5 mg  2.5 mg Oral Daily Clapacs, John T, MD   2.5 mg at 08/10/22 7544   asenapine (SAPHRIS) sublingual tablet 5 mg  5 mg Sublingual QHS Clapacs, John T, MD   5 mg at  08/09/22 2106   citalopram (CELEXA) tablet 20 mg  20 mg Oral Daily Clapacs, Madie Reno, MD   20 mg at 08/10/22 9201   cyanocobalamin (VITAMIN B12) tablet 1,000 mcg  1,000 mcg Oral Daily Clapacs, Madie Reno, MD   1,000 mcg at 08/10/22 0071   cyanocobalamin (VITAMIN B12) tablet 500 mcg  500 mcg Oral Daily Clapacs, Madie Reno, MD   500 mcg at 08/10/22 2197   cyclobenzaprine (FLEXERIL) tablet 5 mg  5 mg Oral TID PRN Clapacs, Madie Reno, MD       lamoTRIgine (LAMICTAL) tablet 25 mg  25 mg Oral Daily Clapacs, John T, MD   25 mg at 08/10/22 5883   LORazepam (ATIVAN) tablet 1 mg  1 mg Oral QHS PRN Clapacs, Madie Reno, MD       magnesium hydroxide (MILK OF MAGNESIA) suspension 30 mL  30 mL Oral Daily PRN Clapacs, John T, MD       melatonin tablet 5 mg  5 mg Oral QHS [name], RPH   5 mg at 08/09/22 2106   mometasone-formoterol (DULERA) 200-5 MCG/ACT inhaler 2 puff  2 puff Inhalation BID Clapacs, Madie Reno, MD   2 puff at 08/10/22 0855   pantoprazole (PROTONIX) EC tablet 40 mg  40 mg Oral Daily Clapacs, Madie Reno, MD   40 mg at 08/10/22 2549   QUEtiapine (SEROQUEL) tablet 25 mg  25 mg Oral QHS Clapacs, Madie Reno, MD   25 mg at 08/09/22 2106    Lab Results:  Results for orders placed or performed during the hospital encounter of 08/08/22 (from the past 48 hour(s))  Hemoglobin A1c     Status: None   Collection Time: 08/09/22  6:40 AM  Result Value Ref Range   Hgb A1c MFr Bld 5.4 4.8 - 5.6 %    Comment: (NOTE) Pre diabetes:          5.7%-6.4%  Diabetes:              >6.4%  Glycemic control for   <7.0% adults with diabetes    Mean Plasma Glucose 108.28 mg/dL    Comment: Performed at [facility], [place] [address] [place], [place] [address]  Lipid panel     Status: None   Collection Time: 08/09/22  6:40 AM  Result Value Ref Range   Cholesterol 182 0 - 200 mg/dL   Triglycerides 106 <150 mg/dL   HDL 65 >40 mg/dL   Total CHOL/HDL Ratio 2.8 RATIO   VLDL 21 0 - 40 mg/dL   LDL Cholesterol 96 0 -  99 mg/dL     Comment:        Total Cholesterol/HDL:CHD Risk Coronary Heart Disease Risk Table                     Men   Women  1/2 Average Risk   3.4   3.3  Average Risk       5.0   4.4  2 X Average Risk   9.6   7.1  3 X Average Risk  23.4   11.0        Use the calculated Patient Ratio above and the CHD Risk Table to determine the patient's CHD Risk.        ATP III CLASSIFICATION (LDL):  <100     mg/dL   Optimal  100-129  mg/dL   Near or Above                    Optimal  130-159  mg/dL   Borderline  160-189  mg/dL   High  >190     mg/dL   Very High Performed at [facility], [address] [place], [place] [address]     Blood Alcohol level:  Lab Results  Component Value Date   [facility] <10 08/07/2022   ETH <10 38/09/1750    Metabolic Disorder Labs: Lab Results  Component Value Date   HGBA1C 5.4 08/09/2022   MPG 108.28 08/09/2022   No results found for: "PROLACTIN" Lab Results  Component Value Date   CHOL 182 08/09/2022   TRIG 106 08/09/2022   HDL 65 08/09/2022   CHOLHDL 2.8 08/09/2022   VLDL 21 08/09/2022   LDLCALC 96 08/09/2022    Physical Findings: AIMS:  , ,  ,  ,    CIWA:    COWS:     Musculoskeletal: Strength & Muscle Tone: within normal limits Gait & Station: normal Patient leans: N/A  Psychiatric Specialty Exam: Appearance:  CF, appearing stated age, appears well-nourished;  wearing appropriate to the situation casual clothes, with fair grooming and hygiene. Normal level of alertness and appropriate facial expression.  Attitude/Behavior: calm, cooperative, engaging with appropriate eye contact.  Motor: WNL; dyskinesias not evident. Gait appears in full range.  Speech: spontaneous, clear, coherent, normal comprehension.  Mood: euthymic, "very good".  Affect: appropriately-reactive, full range, a bit euphoric.  Thought process: patient appears coherent, seems to be organized and goal-directed.  Thought content: patient denies suicidal thoughts,  denies homicidal thoughts; did not express any delusions.  Thought perception: patient denies auditory and visual hallucinations. Did not appear internally stimulated.  Cognition: patient is alert and oriented in self, place, date.  Insight: limited  Judgement: questionable   Psychomotor Activity  Psychomotor Activity:No data recorded  Assets  Assets:Resilience; Social Support; Housing; Financial Resources/Insurance   Sleep  Sleep:No data recorded   Physical Exam: Physical Exam ROS Blood pressure (!) 129/93, pulse 73, temperature 98.1 F (36.7 C), resp. rate 18, height '5\' 4"'$  (1.626 m), weight 51.3 kg, SpO2 94 %. Body mass index is 19.4 kg/m.   Treatment Plan Summary: Daily contact with patient to assess and evaluate symptoms and progress in treatment and Medication management  Patient is a 66 year old female with the above-stated past psychiatric history who is seen in follow-up.  Chart reviewed. Patient discussed with nursing. It appears, that patient`s mental status and behavior has been improving with current medications; her behavior is appropriate, she is fully oriented, does not express any delusions and does not  appear internally-stimulated. All the medications will be continued without changes for now (Saphris '5mg'$  PO QHS, Celexa '20mg'$  po daily, Lamictal '25mg'$  PO daily, Seroquel '25mg'$  PO QHS).     Plan:  -continue inpatient psych admission; 15-minute checks; daily contact with patient to assess and evaluate symptoms and progress in treatment; psychoeducation.  -continue scheduled medications:  acetaminophen  500 mg Oral Daily   amLODipine  2.5 mg Oral Daily   asenapine  5 mg Sublingual QHS   citalopram  20 mg Oral Daily   cyanocobalamin  1,000 mcg Oral Daily   cyanocobalamin  500 mcg Oral Daily   lamoTRIgine  25 mg Oral Daily   melatonin  5 mg Oral QHS   mometasone-formoterol  2 puff Inhalation BID   pantoprazole  40 mg Oral Daily   QUEtiapine  25 mg Oral QHS     -continue PRN medications.  acetaminophen, albuterol, alum & mag hydroxide-simeth, cyclobenzaprine, LORazepam, magnesium hydroxide  -Disposition: Estimated duration of hospitalization: midweek next week.  All necessary aftercare will be arranged prior to discharge Likely d/c home with outpatient psych follow-up.   Larita Fife, MD 08/10/2022, 11:43 AM

## 2022-08-10 NOTE — Group Note (Signed)
ype of Therapy and Topic:  Group Therapy:  Cycle of Depression   Participation Level:  Active    Description of Group:  Patients in this group were introduced to the idea of the cycle of depression. Patients explored how stressors can trigger thoughts, feels and physical symptoms that can make you behave in ways that increase symptoms of depression.  Patient identified specific stressors that have triggered depression and explored thoughts that they have about themselves that may not always be true.   Patients encouraged to come up with positive changes and interventions put in place to stop cycle of depression. Patients also participated in discussion about benefits of being able to identify stressors, thoughts, feels and behavioral responses when depressed.      Therapeutic Goals:               1)  To discuss the positive and negative impacts of depressive feels             2)  identify signs and symptoms of depression             3)  discuss alternative behaviors to stop cycle of depression             4)  offer mutual support to others regarding depression             5)  Developing plans for ways to manage specific stressors upon discharge               Summary of Patient Progress:  Patient participated appropriately in group.  Patient discussed that she used to feel ashamed because her son had autism but now she doesn't which has helped with depression.  Patient discussed positive interventions such as reframing and taking her medications to stop cycle of depression.   Therapeutic Modalities:   Motivational Interviewing Brief Solution-Focused Therapy    Ebb Carelock, LCSW, [place] Social Worker  [facility]

## 2022-08-10 NOTE — Progress Notes (Signed)
Patient has been animated and hyper all shift. She has been trying to coordinate care for the other patients and arranging activities. Verbal redirection provided to patient.

## 2022-08-11 DIAGNOSIS — F23 Brief psychotic disorder: Secondary | ICD-10-CM

## 2022-08-11 MED ORDER — LAMOTRIGINE 25 MG PO TABS
25.0000 mg | ORAL_TABLET | ORAL | Status: DC
  Administered 2022-08-11 – 2022-08-13 (×4): 25 mg via ORAL
  Filled 2022-08-11 (×4): qty 1

## 2022-08-11 MED ORDER — QUETIAPINE FUMARATE 25 MG PO TABS
50.0000 mg | ORAL_TABLET | Freq: Every day | ORAL | Status: DC
  Administered 2022-08-11: 50 mg via ORAL
  Filled 2022-08-11: qty 2

## 2022-08-11 NOTE — Group Note (Signed)
[facility] LCSW Group Therapy Note    Group Date: 08/11/2022 Start Time: 1300 End Time: 1400  Type of Therapy and Topic:  Group Therapy:  Overcoming Obstacles  Participation Level:  [facility] PARTICIPATION LEVEL: Active  Mood:  Description of Group:   In this group patients will be encouraged to explore what they see as obstacles to their own wellness and recovery. They will be guided to discuss their thoughts, feelings, and behaviors related to these obstacles. The group will process together ways to cope with barriers, with attention given to specific choices patients can make. Each patient will be challenged to identify changes they are motivated to make in order to overcome their obstacles. This group will be process-oriented, with patients participating in exploration of their own experiences as well as giving and receiving support and challenge from other group members.  Therapeutic Goals: 1. Patient will identify personal and current obstacles as they relate to admission. 2. Patient will identify barriers that currently interfere with their wellness or overcoming obstacles.  3. Patient will identify feelings, thought process and behaviors related to these barriers. 4. Patient will identify two changes they are willing to make to overcome these obstacles:    Summary of Patient Progress Patient was present in group and an active participant.  Patient was able to engage in discussion on passive, aggressive and assertive communication styles.  Patient was able to identify when she displayed all three examples of communication in her life.  Patient was supportive of other group members.   Therapeutic Modalities:   Cognitive Behavioral Therapy Solution Focused Therapy Motivational Interviewing Relapse Prevention Therapy   Rozann Lesches, LCSW

## 2022-08-11 NOTE — [facility] Suicide Risk Assessment (Signed)
[place] INPATIENT:  Family/Significant Other Suicide Prevention Education  Suicide Prevention Education:  Education Completed; Daughter, Shannan Harper, [phone] has been identified by the patient as the family member/significant other with whom the patient will be residing, and identified as the person(s) who will aid the patient in the event of a mental health crisis (suicidal ideations/suicide attempt).  With written consent from the patient, the family member/significant other has been provided the following suicide prevention education, prior to the and/or following the discharge of the patient.  The suicide prevention education provided includes the following: Suicide risk factors Suicide prevention and interventions National Suicide Hotline telephone number [facility] assessment telephone number [facility] Emergency Assistance [place] and/or Residential Mobile Crisis Unit telephone number  Request made of family/significant other to: Remove weapons (e.g., guns, rifles, knives), all items previously/currently identified as safety concern.   Remove drugs/medications (over-the-counter, prescriptions, illicit drugs), all items previously/currently identified as a safety concern.  The family member/significant other verbalizes understanding of the suicide prevention education information provided.  The family member/significant other agrees to remove the items of safety concern listed above.   Daughter reports "she had a UTI on the June 28th".  Daughter reports "she's been acting like this every since".  Daughter reports that "they've done a lot of scans and tests and can't seem to figure out what was going on but something in her brain has switched". Daughter reports that "like this" is "she's normally an introvert but now she's and extrovert, she's been very strong-willed, and argumentative".  Daughter reports "she rambles on and it almost seemed like she wasn't  breathing, like she was trying to get all of her thoughts out at once".  Daughter reports "she was passive before and didn't want people to be made her but now it just seems like she doesn't care".  Daughter reports "she's not sleeping, she'll text me at like 2AM".  Denies access to weapons.  Denies that a danger to self or others. Daughter reports that patient seems too focused on others on family but she would like for her to focus on herself.   Rozann Lesches 08/11/2022, 10:56 AM

## 2022-08-11 NOTE — Progress Notes (Signed)
[facility] MD Progress Note  08/11/2022 10:43 AM Yvonne Pitts  MRN:  [id number] Subjective: Yvonne Pitts has a convoluted medicine history recently.  She was treated for UTI back in July when her mental status started changing.  She was admitted at that time and had a neurological workup.  Apparently, she was on steroids for possible autoimmune problem.  When she was discharged home she was not sleeping, she was inappropriately laughing, and her mental status was still a problem.  She was recently brought back to the emergency room for this admission with a similar presentation and her medications continued with Seroquel, Saphris, Celexa, and Lamictal.  She seems to be doing better and is alert and oriented x 3 and acting more appropriately, with a slightly elevated mood.  She says she is no longer on steroids.  She has been compliant with her current medications and denies any side effects.  She states that she slept well last night. Her recent urinalysis showed that she still has a UTI.  Although, the ER did not restart her on Bactrim.  We will observe her and get another urinalysis we need to.  Recent neurological workup was negative.  I think she is having problems with UTIs and the steroids.  Principal Problem: Psychosis ([place]) Diagnosis: Principal Problem:   Psychosis ([name])  Total Time spent with patient: 15 minutes  Past Psychiatric History: Unremarkable.  She says that she saw someone outpatients many years ago but does not remember much of it.  Past Medical History:  Past Medical History:  Diagnosis Date   COPD (chronic obstructive pulmonary disease) ([place]) 11/03/2016   DDD (degenerative disc disease), lumbar    Polycythemia    Ruptured disc, cervical    Scoliosis     Past Surgical History:  Procedure Laterality Date   BREAST EXCISIONAL BIOPSY Right    years ago   DILATION AND CURETTAGE OF UTERUS     NECK SURGERY     Family History:  Family History  Problem Relation Age of Onset   Breast  cancer Maternal Aunt 76   Bone cancer Paternal Aunt    Colon cancer Maternal Aunt     Social History:  Social History   Substance and Sexual Activity  Alcohol Use Not Currently   Comment: seldom maybe 2-3 times a year     Social History   Substance and Sexual Activity  Drug Use No    Social History   Socioeconomic History   Marital status: Married    Spouse name: Not on file   Number of children: Not on file   Years of education: Not on file   Highest education level: Not on file  Occupational History   Not on file  Tobacco Use   Smoking status: Every Day    Packs/day: 2.00    Years: 43.00    Total pack years: 86.00    Types: Cigarettes   Smokeless tobacco: Never  Vaping Use   Vaping Use: Never used  Substance and Sexual Activity   Alcohol use: Not Currently    Comment: seldom maybe 2-3 times a year   Drug use: No   Sexual activity: Not Currently  Other Topics Concern   Not on file  Social History Narrative   Not on file   Social Determinants of Health   Financial Resource Strain: Not on file  Food Insecurity: Not on file  Transportation Needs: Not on file  Physical Activity: Not on file  Stress: Not on file  Social Connections: Not on file   Additional Social History:                         Sleep: Good  Appetite:  Good  Current Medications: Current Facility-Administered Medications  Medication Dose Route Frequency Provider Last Rate Last Admin   acetaminophen (TYLENOL) tablet 500 mg  500 mg Oral Daily Clapacs, Madie Reno, MD   500 mg at 08/11/22 1443   acetaminophen (TYLENOL) tablet 650 mg  650 mg Oral Q6H PRN Clapacs, John T, MD   650 mg at 08/10/22 0602   albuterol (VENTOLIN HFA) 108 (90 Base) MCG/ACT inhaler 2 puff  2 puff Inhalation Q6H PRN Clapacs, John T, MD       alum & mag hydroxide-simeth (MAALOX/MYLANTA) 200-200-20 MG/5ML suspension 30 mL  30 mL Oral Q4H PRN Clapacs, John T, MD   30 mL at 08/10/22 0602   amLODipine (NORVASC)  tablet 2.5 mg  2.5 mg Oral Daily Clapacs, John T, MD   2.5 mg at 08/11/22 0842   asenapine (SAPHRIS) sublingual tablet 5 mg  5 mg Sublingual QHS Clapacs, John T, MD   5 mg at 08/10/22 2104   citalopram (CELEXA) tablet 20 mg  20 mg Oral Daily Clapacs, Madie Reno, MD   20 mg at 08/11/22 1540   cyanocobalamin (VITAMIN B12) tablet 1,000 mcg  1,000 mcg Oral Daily Clapacs, Madie Reno, MD   1,000 mcg at 08/11/22 0867   cyanocobalamin (VITAMIN B12) tablet 500 mcg  500 mcg Oral Daily Clapacs, Madie Reno, MD   500 mcg at 08/11/22 0843   cyclobenzaprine (FLEXERIL) tablet 5 mg  5 mg Oral TID PRN Clapacs, Madie Reno, MD       lamoTRIgine (LAMICTAL) tablet 25 mg  25 mg Oral Daily Clapacs, John T, MD   25 mg at 08/11/22 0842   LORazepam (ATIVAN) tablet 1 mg  1 mg Oral QHS PRN Clapacs, John T, MD       magnesium hydroxide (MILK OF MAGNESIA) suspension 30 mL  30 mL Oral Daily PRN Clapacs, John T, MD       melatonin tablet 5 mg  5 mg Oral QHS [name], RPH   5 mg at 08/10/22 2104   mometasone-formoterol (DULERA) 200-5 MCG/ACT inhaler 2 puff  2 puff Inhalation BID Clapacs, Madie Reno, MD   2 puff at 08/11/22 0846   pantoprazole (PROTONIX) EC tablet 40 mg  40 mg Oral Daily Clapacs, Madie Reno, MD   40 mg at 08/11/22 0842   QUEtiapine (SEROQUEL) tablet 25 mg  25 mg Oral QHS Clapacs, Madie Reno, MD   25 mg at 08/10/22 2104    Lab Results: No results found for this or any previous visit (from the past 48 hour(s)).  Blood Alcohol level:  Lab Results  Component Value Date   ETH <10 08/07/2022   ETH <10 61/95/0932    Metabolic Disorder Labs: Lab Results  Component Value Date   HGBA1C 5.4 08/09/2022   MPG 108.28 08/09/2022   No results found for: "PROLACTIN" Lab Results  Component Value Date   CHOL 182 08/09/2022   TRIG 106 08/09/2022   HDL 65 08/09/2022   CHOLHDL 2.8 08/09/2022   VLDL 21 08/09/2022   LDLCALC 96 08/09/2022    Physical Findings: AIMS:  , ,  ,  ,    CIWA:    COWS:     Musculoskeletal: Strength & Muscle  Tone: within normal limits Gait &  Station: normal Patient leans: N/A  Psychiatric Specialty Exam:  Presentation  General Appearance: Bizarre  Eye Contact:Good  Speech:Clear and Coherent  Speech Volume:Normal  Handedness:No data recorded  Mood and Affect  Mood:Anxious; Labile; Euphoric  Affect:Congruent   Thought Process  Thought Processes:Disorganized  Descriptions of Associations:Loose  Orientation:Partial  Thought Content:Illogical  History of Schizophrenia/Schizoaffective disorder:No  Duration of Psychotic Symptoms:No data recorded Hallucinations:No data recorded Ideas of Reference:None  Suicidal Thoughts:No data recorded Homicidal Thoughts:No data recorded  Sensorium  Memory:Immediate Poor; Recent Poor  Judgment:Impaired  Insight:Lacking   Executive Functions  Concentration:Poor  Attention Span:Poor  Recall:Poor  Fund of Knowledge:Poor  Language:Poor   Psychomotor Activity  Psychomotor Activity:No data recorded  Assets  Assets:Resilience; Social Support; Housing; Financial Resources/Insurance   Sleep  Sleep:No data recorded   Physical Exam: Physical Exam Vitals and nursing note reviewed.  Constitutional:      Appearance: Normal appearance. She is normal weight.  Neurological:     General: No focal deficit present.     Mental Status: She is alert and oriented to person, place, and time.  Psychiatric:        Attention and Perception: Attention and perception normal.        Mood and Affect: Mood and affect normal.        Speech: Speech normal.        Behavior: Behavior normal. Behavior is cooperative.        Thought Content: Thought content normal.        Cognition and Memory: Cognition and memory normal.        Judgment: Judgment normal.    Review of Systems  Constitutional: Negative.   HENT: Negative.    Eyes: Negative.   Respiratory: Negative.    Cardiovascular: Negative.   Gastrointestinal: Negative.    Genitourinary: Negative.   Musculoskeletal: Negative.   Skin: Negative.   Neurological: Negative.   Endo/Heme/Allergies: Negative.   Psychiatric/Behavioral: Negative.     Blood pressure 135/63, pulse 63, temperature 97.9 F (36.6 C), resp. rate 18, height '5\' 4"'$  (1.626 m), weight 51.3 kg, SpO2 97 %. Body mass index is 19.4 kg/m.   Treatment Plan Summary: Daily contact with patient to assess and evaluate symptoms and progress in treatment, Medication management, and Plan increase Lamictal to 25 mg twice a day and increase Seroquel to 50 mg at bedtime.  Not sure why she is on Saphris and Seroquel.  Parks Ranger, DO 08/11/2022, 10:43 AM

## 2022-08-11 NOTE — BH IP Treatment Plan (Signed)
Interdisciplinary Treatment and Diagnostic Plan Update  08/11/2022 Time of Session: 10:00AM Yvonne Pitts MRN: [id number]  Principal Diagnosis: Psychosis [name])  Secondary Diagnoses: Principal Problem:   Psychosis ([name])   Current Medications:  Current Facility-Administered Medications  Medication Dose Route Frequency Provider Last Rate Last Admin   acetaminophen (TYLENOL) tablet 500 mg  500 mg Oral Daily Clapacs, Madie Reno, MD   500 mg at 08/11/22 3383   acetaminophen (TYLENOL) tablet 650 mg  650 mg Oral Q6H PRN Clapacs, John T, MD   650 mg at 08/10/22 0602   albuterol (VENTOLIN HFA) 108 (90 Base) MCG/ACT inhaler 2 puff  2 puff Inhalation Q6H PRN Clapacs, John T, MD       alum & mag hydroxide-simeth (MAALOX/MYLANTA) 200-200-20 MG/5ML suspension 30 mL  30 mL Oral Q4H PRN Clapacs, John T, MD   30 mL at 08/10/22 0602   amLODipine (NORVASC) tablet 2.5 mg  2.5 mg Oral Daily Clapacs, John T, MD   2.5 mg at 08/11/22 0842   asenapine (SAPHRIS) sublingual tablet 5 mg  5 mg Sublingual QHS Clapacs, John T, MD   5 mg at 08/10/22 2104   citalopram (CELEXA) tablet 20 mg  20 mg Oral Daily Clapacs, Madie Reno, MD   20 mg at 08/11/22 2919   cyanocobalamin (VITAMIN B12) tablet 1,000 mcg  1,000 mcg Oral Daily Clapacs, Madie Reno, MD   1,000 mcg at 08/11/22 1660   cyanocobalamin (VITAMIN B12) tablet 500 mcg  500 mcg Oral Daily Clapacs, Madie Reno, MD   500 mcg at 08/11/22 0843   cyclobenzaprine (FLEXERIL) tablet 5 mg  5 mg Oral TID PRN Clapacs, Madie Reno, MD       lamoTRIgine (LAMICTAL) tablet 25 mg  25 mg Oral Daily Clapacs, John T, MD   25 mg at 08/11/22 0842   LORazepam (ATIVAN) tablet 1 mg  1 mg Oral QHS PRN Clapacs, John T, MD       magnesium hydroxide (MILK OF MAGNESIA) suspension 30 mL  30 mL Oral Daily PRN Clapacs, John T, MD       melatonin tablet 5 mg  5 mg Oral QHS [name], RPH   5 mg at 08/10/22 2104   mometasone-formoterol (DULERA) 200-5 MCG/ACT inhaler 2 puff  2 puff Inhalation BID Clapacs, Madie Reno, MD    2 puff at 08/11/22 0846   pantoprazole (PROTONIX) EC tablet 40 mg  40 mg Oral Daily Clapacs, Madie Reno, MD   40 mg at 08/11/22 0842   QUEtiapine (SEROQUEL) tablet 25 mg  25 mg Oral QHS Clapacs, John T, MD   25 mg at 08/10/22 2104   PTA Medications: Medications Prior to Admission  Medication Sig Dispense Refill Last Dose   acetaminophen (TYLENOL) 500 MG tablet Take 500 mg by mouth daily.       Albuterol Sulfate (PROAIR RESPICLICK) 600 (90 Base) MCG/ACT AEPB Inhale 2 puffs into the lungs every 6 (six) hours as needed. 2 each 3    amLODipine (NORVASC) 2.5 MG tablet Take 2.5 mg by mouth daily.      asenapine (SAPHRIS) 5 MG SUBL 24 hr tablet Place 1 tablet (5 mg total) under the tongue daily as needed for up to 14 days (for sleep IF the Ativan does not work). 14 tablet 0    budesonide-formoterol (SYMBICORT) 160-4.5 MCG/ACT inhaler INHALE 2 INHALATIONS INTO THE LUNGS 2 TIMES DAILY      citalopram (CELEXA) 20 MG tablet Take 20 mg by mouth daily.  [employer] B-12 500 MCG tablet Take 500 mcg by mouth daily.      cyanocobalamin 1000 MCG tablet Take 1 tablet (1,000 mcg total) by mouth daily. 30 tablet 3    cyclobenzaprine (FLEXERIL) 5 MG tablet Take 5 mg by mouth 3 (three) times daily as needed for muscle spasms.      lamoTRIgine (LAMICTAL) 25 MG tablet Take by mouth.      LORazepam (ATIVAN) 1 MG tablet Take 1 tablet (1 mg total) by mouth at bedtime as needed for sleep. 30 tablet 0    Melatonin 5 MG TABS Take 1 tablet by mouth daily.       omeprazole (PRILOSEC) 20 MG capsule Take 20 mg by mouth daily.      QUEtiapine (SEROQUEL) 25 MG tablet Take 25 mg by mouth at bedtime.      Respiratory Therapy Supplies (FLUTTER) DEVI Use as directed 1 each 0     Patient Stressors: Other: none    Patient Strengths: Supportive family/friends   Treatment Modalities: Medication Management, Group therapy, Case management,  1 to 1 session with clinician, Psychoeducation, Recreational therapy.   Physician Treatment Plan  for Primary Diagnosis: Psychosis ([place]) Long Term Goal(s): Improvement in symptoms so as ready for discharge   Short Term Goals: Ability to identify changes in lifestyle to reduce recurrence of condition will improve Ability to verbalize feelings will improve Ability to disclose and discuss suicidal ideas Ability to demonstrate self-control will improve Ability to identify and develop effective coping behaviors will improve Ability to maintain clinical measurements within normal limits will improve Compliance with prescribed medications will improve Ability to identify triggers associated with substance abuse/mental health issues will improve  Medication Management: Evaluate patient's response, side effects, and tolerance of medication regimen.  Therapeutic Interventions: 1 to 1 sessions, Unit Group sessions and Medication administration.  Evaluation of Outcomes: Not Met  Physician Treatment Plan for Secondary Diagnosis: Principal Problem:   Psychosis ([place])  Long Term Goal(s): Improvement in symptoms so as ready for discharge   Short Term Goals: Ability to identify changes in lifestyle to reduce recurrence of condition will improve Ability to verbalize feelings will improve Ability to disclose and discuss suicidal ideas Ability to demonstrate self-control will improve Ability to identify and develop effective coping behaviors will improve Ability to maintain clinical measurements within normal limits will improve Compliance with prescribed medications will improve Ability to identify triggers associated with substance abuse/mental health issues will improve     Medication Management: Evaluate patient's response, side effects, and tolerance of medication regimen.  Therapeutic Interventions: 1 to 1 sessions, Unit Group sessions and Medication administration.  Evaluation of Outcomes: Not Met   RN Treatment Plan for Primary Diagnosis: Psychosis ([place]) Long Term Goal(s): Knowledge of  disease and therapeutic regimen to maintain health will improve  Short Term Goals: Ability to demonstrate self-control, Ability to participate in decision making will improve, Ability to verbalize feelings will improve, Ability to disclose and discuss suicidal ideas, Ability to identify and develop effective coping behaviors will improve, and Compliance with prescribed medications will improve  Medication Management: RN will administer medications as ordered by provider, will assess and evaluate patient's response and provide education to patient for prescribed medication. RN will report any adverse and/or side effects to prescribing provider.  Therapeutic Interventions: 1 on 1 counseling sessions, Psychoeducation, Medication administration, Evaluate responses to treatment, Monitor vital signs and CBGs as ordered, Perform/monitor CIWA, COWS, AIMS and Fall Risk screenings as ordered, Perform wound care treatments as  ordered.  Evaluation of Outcomes: Not Met   LCSW Treatment Plan for Primary Diagnosis: Psychosis ([place]) Long Term Goal(s): Safe transition to appropriate next level of care at discharge, Engage patient in therapeutic group addressing interpersonal concerns.  Short Term Goals: Engage patient in aftercare planning with referrals and resources, Increase social support, Increase ability to appropriately verbalize feelings, Increase emotional regulation, Facilitate acceptance of mental health diagnosis and concerns, and Increase skills for wellness and recovery  Therapeutic Interventions: Assess for all discharge needs, 1 to 1 time with Social worker, Explore available resources and support systems, Assess for adequacy in community support network, Educate family and significant other(s) on suicide prevention, Complete Psychosocial Assessment, Interpersonal group therapy.  Evaluation of Outcomes: Not Met   Progress in Treatment: Attending groups: Yes. Participating in groups: Yes. Taking  medication as prescribed: Yes. Toleration medication: Yes. Family/Significant other contact made: No, will contact:  once permission is given. Patient understands diagnosis: Yes. Discussing patient identified problems/goals with staff: Yes. Medical problems stabilized or resolved: Yes. Denies suicidal/homicidal ideation: Yes. Issues/concerns per patient self-inventory: No. Other: none  New problem(s) identified: No, Describe:  none  New Short Term/Long Term Goal(s): elimination of symptoms of psychosis, medication management for mood stabilization; elimination of SI thoughts; development of comprehensive mental wellness/sobriety plan.   Patient Goals:  "not really I think I did it all"  Discharge Plan or Barriers: CSW to assist the patient in development of appropriate discharge plans.    Reason for Continuation of Hospitalization: Anxiety Depression Mania Medication stabilization  Estimated Length of Stay:  1-7 days  Last 3 [place] Suicide Severity Risk Score: Flowsheet Row Admission (Current) from 08/08/2022 in [facility] ED from 08/07/2022 in [place] ED from 07/25/2022 in [facility] No Risk No Risk No Risk       Last PHQ 2/9 Scores:     No data to display          Scribe for Treatment Team: Rozann Lesches, Marlinda Mike 08/11/2022 10:52 AM

## 2022-08-11 NOTE — BH Assessment (Signed)
2030 Received patient in the day room going from table to table being hyper verbal and talking loud. She appear to not to be aware of the volume or her constant movement. She can also be somewhat intrusive. Patient is redirectable.  2145 Patient is medication compliant. She is currently denying SI/HI, A/V hallucinations, but states she is somewhat down.   0400 Patient  up in the shower and attempted to sit in the dining room but was informed that the day room does not officially open until 0600. Patient returned to her room.  0600 Patient in the day room watching TV with a peer. She remains hyper-verbal with movment.

## 2022-08-11 NOTE — Progress Notes (Signed)
Patient denies SI, HI, and AVH. She is animated and said she is "doing great". Patient appetite is good. She is compliant with scheduled medications. Patient is hyperverbal and intrusive at times. Patient remains safe on the unit at this time.

## 2022-08-12 DIAGNOSIS — F23 Brief psychotic disorder: Secondary | ICD-10-CM | POA: Diagnosis not present

## 2022-08-12 MED ORDER — QUETIAPINE FUMARATE 100 MG PO TABS
100.0000 mg | ORAL_TABLET | Freq: Every day | ORAL | Status: DC
  Administered 2022-08-12: 100 mg via ORAL
  Filled 2022-08-12: qty 1

## 2022-08-12 MED ORDER — LORAZEPAM 0.5 MG PO TABS
0.5000 mg | ORAL_TABLET | ORAL | Status: DC | PRN
  Administered 2022-08-12: 0.5 mg via ORAL
  Filled 2022-08-12 (×2): qty 1

## 2022-08-12 NOTE — Progress Notes (Signed)
[facility] MD Progress Note  08/12/2022 11:33 AM Yvonne Pitts  MRN:  [id number] Subjective: Yvonne Pitts is seen on rounds.  She is a little bit more irritable today because she does not want to be here.  She has been compliant with her medications and it seems to be calming her down.  I also think that not having steroids on board is helpful.  She slept 6 hours last night nurses report that she is much more appropriate today.  Husband called my guess says that she is still having problems with her memory.  No side effects from her medications.  She is definitely not as hypomanic.  Principal Problem: Psychosis ([place]) Diagnosis: Principal Problem:   Psychosis ([place])  Total Time spent with patient: 15 minutes  Past Psychiatric History: None  Past Medical History:  Past Medical History:  Diagnosis Date   COPD (chronic obstructive pulmonary disease) ([place]) 11/03/2016   DDD (degenerative disc disease), lumbar    Polycythemia    Ruptured disc, cervical    Scoliosis     Past Surgical History:  Procedure Laterality Date   BREAST EXCISIONAL BIOPSY Right    years ago   DILATION AND CURETTAGE OF UTERUS     NECK SURGERY     Family History:  Family History  Problem Relation Age of Onset   Breast cancer Maternal Aunt 31   Bone cancer Paternal Aunt    Colon cancer Maternal Aunt     Social History:  Social History   Substance and Sexual Activity  Alcohol Use Not Currently   Comment: seldom maybe 2-3 times a year     Social History   Substance and Sexual Activity  Drug Use No    Social History   Socioeconomic History   Marital status: Married    Spouse name: Not on file   Number of children: Not on file   Years of education: Not on file   Highest education level: Not on file  Occupational History   Not on file  Tobacco Use   Smoking status: Every Day    Packs/day: 2.00    Years: 43.00    Total pack years: 86.00    Types: Cigarettes   Smokeless tobacco: Never  Vaping Use   Vaping  Use: Never used  Substance and Sexual Activity   Alcohol use: Not Currently    Comment: seldom maybe 2-3 times a year   Drug use: No   Sexual activity: Not Currently  Other Topics Concern   Not on file  Social History Narrative   Not on file   Social Determinants of Health   Financial Resource Strain: Not on file  Food Insecurity: Not on file  Transportation Needs: Not on file  Physical Activity: Not on file  Stress: Not on file  Social Connections: Not on file   Additional Social History:                         Sleep: Good  Appetite:  Good  Current Medications: Current Facility-Administered Medications  Medication Dose Route Frequency Provider Last Rate Last Admin   acetaminophen (TYLENOL) tablet 500 mg  500 mg Oral Daily Clapacs, Madie Reno, MD   500 mg at 08/12/22 0810   acetaminophen (TYLENOL) tablet 650 mg  650 mg Oral Q6H PRN Clapacs, John T, MD   650 mg at 08/10/22 0602   albuterol (VENTOLIN HFA) 108 (90 Base) MCG/ACT inhaler 2 puff  2 puff Inhalation Q6H  PRN Clapacs, Madie Reno, MD       alum & mag hydroxide-simeth (MAALOX/MYLANTA) 200-200-20 MG/5ML suspension 30 mL  30 mL Oral Q4H PRN Clapacs, John T, MD   30 mL at 08/10/22 0602   amLODipine (NORVASC) tablet 2.5 mg  2.5 mg Oral Daily Clapacs, John T, MD   2.5 mg at 08/12/22 0811   asenapine (SAPHRIS) sublingual tablet 5 mg  5 mg Sublingual QHS Clapacs, John T, MD   5 mg at 08/11/22 2121   citalopram (CELEXA) tablet 20 mg  20 mg Oral Daily Clapacs, Madie Reno, MD   20 mg at 08/12/22 0810   cyanocobalamin (VITAMIN B12) tablet 1,000 mcg  1,000 mcg Oral Daily Clapacs, Madie Reno, MD   1,000 mcg at 08/12/22 0810   cyanocobalamin (VITAMIN B12) tablet 500 mcg  500 mcg Oral Daily Clapacs, Madie Reno, MD   500 mcg at 08/12/22 0630   cyclobenzaprine (FLEXERIL) tablet 5 mg  5 mg Oral TID PRN Clapacs, Madie Reno, MD       lamoTRIgine (LAMICTAL) tablet 25 mg  25 mg Oral [id number] Parks Ranger, DO   25 mg at 08/12/22 0810   LORazepam  (ATIVAN) tablet 1 mg  1 mg Oral QHS PRN Clapacs, John T, MD   1 mg at 08/12/22 0210   magnesium hydroxide (MILK OF MAGNESIA) suspension 30 mL  30 mL Oral Daily PRN Clapacs, John T, MD       melatonin tablet 5 mg  5 mg Oral QHS [name], RPH   5 mg at 08/11/22 2123   mometasone-formoterol (DULERA) 200-5 MCG/ACT inhaler 2 puff  2 puff Inhalation BID Clapacs, Madie Reno, MD   2 puff at 08/12/22 0811   pantoprazole (PROTONIX) EC tablet 40 mg  40 mg Oral Daily Clapacs, Madie Reno, MD   40 mg at 08/12/22 0810   QUEtiapine (SEROQUEL) tablet 50 mg  50 mg Oral QHS Parks Ranger, DO   50 mg at 08/11/22 2123    Lab Results: No results found for this or any previous visit (from the past 48 hour(s)).  Blood Alcohol level:  Lab Results  Component Value Date   ETH <10 08/07/2022   ETH <10 16/12/930    Metabolic Disorder Labs: Lab Results  Component Value Date   HGBA1C 5.4 08/09/2022   MPG 108.28 08/09/2022   No results found for: "PROLACTIN" Lab Results  Component Value Date   CHOL 182 08/09/2022   TRIG 106 08/09/2022   HDL 65 08/09/2022   CHOLHDL 2.8 08/09/2022   VLDL 21 08/09/2022   LDLCALC 96 08/09/2022    Physical Findings: AIMS:  , ,  ,  ,    CIWA:    COWS:     Musculoskeletal: Strength & Muscle Tone: within normal limits Gait & Station: normal Patient leans: N/A  Psychiatric Specialty Exam:  Presentation  General Appearance: Bizarre  Eye Contact:Good  Speech:Clear and Coherent  Speech Volume:Normal  Handedness:No data recorded  Mood and Affect  Mood:Anxious; Labile; Euphoric  Affect:Congruent   Thought Process  Thought Processes:Disorganized  Descriptions of Associations:Loose  Orientation:Partial  Thought Content:Illogical  History of Schizophrenia/Schizoaffective disorder:No  Duration of Psychotic Symptoms:No data recorded Hallucinations:No data recorded Ideas of Reference:None  Suicidal Thoughts:No data recorded Homicidal Thoughts:No  data recorded  Sensorium  Memory:Immediate Poor; Recent Poor  Judgment:Impaired  Insight:Lacking   Executive Functions  Concentration:Poor  Attention Span:Poor  Recall:Poor  Fund of Knowledge:Poor  Language:Poor   Psychomotor Activity  Psychomotor  Activity:No data recorded  Assets  Assets:Resilience; Social Support; Housing; Financial Resources/Insurance   Sleep  Sleep:No data recorded   Physical Exam: Physical Exam Vitals and nursing note reviewed.  Constitutional:      Appearance: Normal appearance. She is normal weight.  Neurological:     General: No focal deficit present.     Mental Status: She is alert and oriented to person, place, and time.  Psychiatric:        Attention and Perception: Attention and perception normal.        Mood and Affect: Mood and affect normal.        Speech: Speech normal.        Behavior: Behavior normal. Behavior is cooperative.        Thought Content: Thought content normal.        Cognition and Memory: Cognition and memory normal.        Judgment: Judgment normal.    Review of Systems  Constitutional: Negative.   HENT: Negative.    Eyes: Negative.   Respiratory: Negative.    Cardiovascular: Negative.   Gastrointestinal: Negative.   Genitourinary: Negative.   Musculoskeletal: Negative.   Skin: Negative.   Neurological: Negative.   Endo/Heme/Allergies: Negative.   Psychiatric/Behavioral: Negative.     Blood pressure 128/67, pulse 60, temperature 97.6 F (36.4 C), resp. rate 18, height '5\' 4"'$  (1.626 m), weight 51.3 kg, SpO2 97 %. Body mass index is 19.4 kg/m.   Treatment Plan Summary: Daily contact with patient to assess and evaluate symptoms and progress in treatment, Medication management, and Plan continue current medications.  Continue to observe.  Parks Ranger, DO 08/12/2022, 11:33 AM

## 2022-08-12 NOTE — BHH Counselor (Signed)
CSW obtained permission from the patient to speak with her husband, Yvonne Pitts, [phone].  Husband reports that "she is way out there from where she used to be".  He reports that the patient has increased in forgetfulness, and has become argumentative.  He reports that the patient does not recognize when she has done wrong.  He reports that the patient "left a message on someone's answering machine and then told me that 'they didn't get it' I tried to explain that they have to listen to it and may or may not call back but she was adamant that they had not received the message".  He reports "she went up to our neighbor and asked her if she was gay".  He reports that these behaviors are outside of the patient's norms.   Yvonne Pitts, MSW, LCSW 08/12/2022 10:52 AM

## 2022-08-12 NOTE — Progress Notes (Signed)
Patient came to nurses station upset because she is still in the hospital. Patient worried she will be here for too long and she wants to be home. She states, "they gave me something in the emergency room that made me hallucinate." Patient verbally de-escalated. Patient is anxious and tearful.

## 2022-08-12 NOTE — Progress Notes (Signed)
Patient denies SI, HI, and AVH. She states she is a little sleepy today but has no other complaints. Night shift staff reported she slept about 6 hours during the night. Patient is visible in the milieu and has been sitting in the dayroom all morning. She is less restless and intrusive today compared to yesterday. Patient is compliant with scheduled medications. She interacts appropriately with staff and other patients on the unit. Support and encouragement provided. Patient remains safe on the unit at this time.

## 2022-08-13 DIAGNOSIS — F23 Brief psychotic disorder: Secondary | ICD-10-CM | POA: Diagnosis not present

## 2022-08-13 MED ORDER — SULFAMETHOXAZOLE-TRIMETHOPRIM 800-160 MG PO TABS: 1.0000 | ORAL_TABLET | Freq: Two times a day (BID) | ORAL | Status: DC

## 2022-08-13 MED ORDER — LAMOTRIGINE 25 MG PO TABS
50.0000 mg | ORAL_TABLET | ORAL | Status: DC
  Administered 2022-08-13 – 2022-08-16 (×6): 50 mg via ORAL
  Filled 2022-08-13 (×6): qty 2

## 2022-08-13 MED ORDER — QUETIAPINE FUMARATE 25 MG PO TABS
150.0000 mg | ORAL_TABLET | Freq: Every day | ORAL | Status: DC
  Administered 2022-08-13 – 2022-08-15 (×3): 150 mg via ORAL
  Filled 2022-08-13 (×3): qty 2

## 2022-08-13 NOTE — Progress Notes (Signed)
Patient denies SI, HI, and AVH. She says she feels good and slept better than the previous night. She denies any physical complaints and denies medication side effects. She is compliant with scheduled medications. Patient remains safe on the unit at this time.

## 2022-08-13 NOTE — Progress Notes (Signed)
[facility] MD Progress Note  08/13/2022 9:57 AM Yvonne Pitts  MRN:  [id number] Subjective: Yvonne Pitts is seen on rounds.  Her speech is less pressured and she is calmer.  Nurses note the same.  I spoke with her husband who said that he thought she was a little bit better this morning.  He would like to see her completely better before discharge because she has been to the ER to many times.  She had an UTI in the ER and for some reason they did not start any antibiotic so I told him to schedule ahead and start it.  He informs me that her behavior changed when she first got a UTI and then she went in the hospital and received steroids and then her behavior continued to worsen.  I told him that I think it was the UTIs and the steroids that probably contributed to this problem.  I told him I am making some changes but her commitment is up tomorrow but she agreed to stay until Saturday.  Principal Problem: Psychosis ([place]) Diagnosis: Principal Problem:   Psychosis ([place])  Total Time spent with patient: 15 minutes  Past Psychiatric History: None  Past Medical History:  Past Medical History:  Diagnosis Date   COPD (chronic obstructive pulmonary disease) ([place]) 11/03/2016   DDD (degenerative disc disease), lumbar    Polycythemia    Ruptured disc, cervical    Scoliosis     Past Surgical History:  Procedure Laterality Date   BREAST EXCISIONAL BIOPSY Right    years ago   DILATION AND CURETTAGE OF UTERUS     NECK SURGERY     Family History:  Family History  Problem Relation Age of Onset   Breast cancer Maternal Aunt 8   Bone cancer Paternal Aunt    Colon cancer Maternal Aunt     Social History:  Social History   Substance and Sexual Activity  Alcohol Use Not Currently   Comment: seldom maybe 2-3 times a year     Social History   Substance and Sexual Activity  Drug Use No    Social History   Socioeconomic History   Marital status: Married    Spouse name: Not on file   Number of children:  Not on file   Years of education: Not on file   Highest education level: Not on file  Occupational History   Not on file  Tobacco Use   Smoking status: Every Day    Packs/day: 2.00    Years: 43.00    Total pack years: 86.00    Types: Cigarettes   Smokeless tobacco: Never  Vaping Use   Vaping Use: Never used  Substance and Sexual Activity   Alcohol use: Not Currently    Comment: seldom maybe 2-3 times a year   Drug use: No   Sexual activity: Not Currently  Other Topics Concern   Not on file  Social History Narrative   Not on file   Social Determinants of Health   Financial Resource Strain: Not on file  Food Insecurity: Not on file  Transportation Needs: Not on file  Physical Activity: Not on file  Stress: Not on file  Social Connections: Not on file   Additional Social History:                         Sleep: Good  Appetite:  Good  Current Medications: Current Facility-Administered Medications  Medication Dose Route Frequency Provider Last Rate  Last Admin   acetaminophen (TYLENOL) tablet 500 mg  500 mg Oral Daily Clapacs, Madie Reno, MD   500 mg at 08/13/22 0749   acetaminophen (TYLENOL) tablet 650 mg  650 mg Oral Q6H PRN Clapacs, John T, MD   650 mg at 08/12/22 1356   albuterol (VENTOLIN HFA) 108 (90 Base) MCG/ACT inhaler 2 puff  2 puff Inhalation Q6H PRN Clapacs, John T, MD       alum & mag hydroxide-simeth (MAALOX/MYLANTA) 200-200-20 MG/5ML suspension 30 mL  30 mL Oral Q4H PRN Clapacs, John T, MD   30 mL at 08/10/22 0602   amLODipine (NORVASC) tablet 2.5 mg  2.5 mg Oral Daily Clapacs, John T, MD   2.5 mg at 08/13/22 0747   citalopram (CELEXA) tablet 20 mg  20 mg Oral Daily Clapacs, Madie Reno, MD   20 mg at 08/13/22 1443   cyanocobalamin (VITAMIN B12) tablet 1,000 mcg  1,000 mcg Oral Daily Clapacs, Madie Reno, MD   1,000 mcg at 08/13/22 1540   cyanocobalamin (VITAMIN B12) tablet 500 mcg  500 mcg Oral Daily Clapacs, John T, MD   500 mcg at 08/13/22 0749    cyclobenzaprine (FLEXERIL) tablet 5 mg  5 mg Oral TID PRN Clapacs, Madie Reno, MD       lamoTRIgine (LAMICTAL) tablet 50 mg  50 mg Oral [id number] Abbegail Matuska Edward, DO       LORazepam (ATIVAN) tablet 0.5 mg  0.5 mg Oral Q4H PRN Parks Ranger, DO   0.5 mg at 08/12/22 1411   LORazepam (ATIVAN) tablet 1 mg  1 mg Oral QHS PRN Clapacs, Madie Reno, MD   1 mg at 08/12/22 2129   magnesium hydroxide (MILK OF MAGNESIA) suspension 30 mL  30 mL Oral Daily PRN Clapacs, John T, MD       melatonin tablet 5 mg  5 mg Oral QHS [name], RPH   5 mg at 08/12/22 2130   mometasone-formoterol (DULERA) 200-5 MCG/ACT inhaler 2 puff  2 puff Inhalation BID Clapacs, Madie Reno, MD   2 puff at 08/13/22 0751   pantoprazole (PROTONIX) EC tablet 40 mg  40 mg Oral Daily Clapacs, John T, MD   40 mg at 08/13/22 0748   QUEtiapine (SEROQUEL) tablet 150 mg  150 mg Oral QHS Parks Ranger, DO       sulfamethoxazole-trimethoprim (BACTRIM DS) 800-160 MG per tablet 1 tablet  1 tablet Oral Q12H Parks Ranger, DO        Lab Results: No results found for this or any previous visit (from the past 48 hour(s)).  Blood Alcohol level:  Lab Results  Component Value Date   ETH <10 08/07/2022   ETH <10 08/67/6195    Metabolic Disorder Labs: Lab Results  Component Value Date   HGBA1C 5.4 08/09/2022   MPG 108.28 08/09/2022   No results found for: "PROLACTIN" Lab Results  Component Value Date   CHOL 182 08/09/2022   TRIG 106 08/09/2022   HDL 65 08/09/2022   CHOLHDL 2.8 08/09/2022   VLDL 21 08/09/2022   LDLCALC 96 08/09/2022    Physical Findings: AIMS:  , ,  ,  ,    CIWA:    COWS:     Musculoskeletal: Strength & Muscle Tone: within normal limits Gait & Station: normal Patient leans: N/A  Psychiatric Specialty Exam:  Presentation  General Appearance: Bizarre  Eye Contact:Good  Speech:Clear and Coherent  Speech Volume:Normal  Handedness:No data recorded  Mood and Affect  Mood:Anxious;  Labile; Euphoric  Affect:Congruent   Thought Process  Thought Processes:Disorganized  Descriptions of Associations:Loose  Orientation:Partial  Thought Content:Illogical  History of Schizophrenia/Schizoaffective disorder:No  Duration of Psychotic Symptoms:No data recorded Hallucinations:No data recorded Ideas of Reference:None  Suicidal Thoughts:No data recorded Homicidal Thoughts:No data recorded  Sensorium  Memory:Immediate Poor; Recent Poor  Judgment:Impaired  Insight:Lacking   Executive Functions  Concentration:Poor  Attention Span:Poor  Recall:Poor  Fund of Knowledge:Poor  Language:Poor   Psychomotor Activity  Psychomotor Activity:No data recorded  Assets  Assets:Resilience; Social Support; Housing; Financial Resources/Insurance   Sleep  Sleep:No data recorded   Physical Exam: Physical Exam Vitals and nursing note reviewed.  Constitutional:      Appearance: Normal appearance. She is normal weight.  Neurological:     General: No focal deficit present.     Mental Status: She is alert and oriented to person, place, and time.  Psychiatric:        Attention and Perception: Attention and perception normal.        Mood and Affect: Mood normal. Affect is labile.        Speech: Speech normal.        Behavior: Behavior normal. Behavior is cooperative.        Thought Content: Thought content normal.        Cognition and Memory: Cognition is impaired.        Judgment: Judgment is impulsive and inappropriate.    Review of Systems  Constitutional: Negative.   HENT: Negative.    Eyes: Negative.   Respiratory: Negative.    Cardiovascular: Negative.   Gastrointestinal: Negative.   Genitourinary: Negative.   Musculoskeletal: Negative.   Skin: Negative.   Neurological: Negative.   Endo/Heme/Allergies: Negative.   Psychiatric/Behavioral: Negative.     Blood pressure (!) 120/49, pulse 63, temperature 97.7 F (36.5 C), resp. rate 18, height '5\' 4"'$   (1.626 m), weight 51.3 kg, SpO2 97 %. Body mass index is 19.4 kg/m.   Treatment Plan Summary: Daily contact with patient to assess and evaluate symptoms and progress in treatment, Medication management, and Plan increase Lamictal to 50 mg twice a day.  Increase Seroquel to 150 mg at bedtime.  Start Bactrim DS every 12 hours for 5 days.  Parks Ranger, DO 08/13/2022, 9:57 AM

## 2022-08-13 NOTE — Progress Notes (Signed)
Chaplain provided a Bible and journal for patient.

## 2022-08-13 NOTE — Group Note (Signed)
[facility] LCSW Group Therapy Note   Group Date: 08/13/2022 Start Time: 1400 End Time: 1500   Type of Therapy and Topic: Group Therapy: Avoiding Self-Sabotaging and Enabling Behaviors  Participation Level: Active  Mood: pleasant   Description of Group:  In this group, patients will learn how to identify obstacles, self-sabotaging and enabling behaviors, as well as: what are they, why do we do them and what needs these behaviors meet. Discuss unhealthy relationships and how to have positive healthy boundaries with those that sabotage and enable. Explore aspects of self-sabotage and enabling in yourself and how to limit these self-destructive behaviors in everyday life.   Therapeutic Goals: 1. Patient will identify one obstacle that relates to self-sabotage and enabling behaviors 2. Patient will identify one personal self-sabotaging or enabling behavior they did prior to admission 3. Patient will state a plan to change the above identified behavior 4. Patient will demonstrate ability to communicate their needs through discussion and/or role play.    Summary of Patient Progress: Patient was present for the entirety of the group session. Patient was an active listener and participated in the topic of discussion, provided helpful advice to others, and added nuance to topic of conversation. Patient shared throughout the group, often tangential to the conversation. However, patient was pleasant and polite to others in the group.    Therapeutic Modalities:  Cognitive Behavioral Therapy Person-Centered Therapy Motivational Interviewing    Durenda Hurt, [place]

## 2022-08-13 NOTE — Plan of Care (Signed)
  Problem: Education: Goal: Knowledge of General Education information will improve Description: Including pain rating scale, medication(s)/side effects and non-pharmacologic comfort measures Outcome: Progressing   Problem: Health Behavior/Discharge Planning: Goal: Ability to manage health-related needs will improve Outcome: Progressing   Problem: Clinical Measurements: Goal: Ability to maintain clinical measurements within normal limits will improve Outcome: Progressing   Problem: Clinical Measurements: Goal: Will remain free from infection Outcome: Progressing Patient compliant with medications. No adverse effects noted. Denies SI/HI/A/VH and verbally contracted for safety. Q 15 minutes safety checks ongoing Patient remains safe.

## 2022-08-13 NOTE — Progress Notes (Signed)
   08/13/22 1600  Clinical Encounter Type  Visited With Patient  Visit Type Initial  Referral From Nurse  Consult/Referral To Chaplain   Chaplain responded to nurse consult. Chaplain met with patient in day room. Chaplain provided compassionate presence and reflective listening as patient spoke about hospital stay. Patient appreciated [place] visit.

## 2022-08-14 DIAGNOSIS — F23 Brief psychotic disorder: Secondary | ICD-10-CM | POA: Diagnosis not present

## 2022-08-14 NOTE — Progress Notes (Signed)
Patient alert and oriented x4 but patient believes she is discharging today, but according to Dr. Louis Meckel, she is projected to leave 8/19. She denies any SI/HI/AVH, denies pain at present, states she slept well last night and feels ready to go home. Education provided to pt on the importance of staying compliant with medications when she goes home. Will continue q34mn checks.

## 2022-08-14 NOTE — Progress Notes (Addendum)
Patient is seen in dayroom at the beginning of the shift with visitor. She is AAOx4. She denies SI, HI, AVH, anxiety, and depression. She is animated and assertive in interaction. She is in no current distress. Q 15 min safety checks in place.

## 2022-08-14 NOTE — Group Note (Signed)
LCSW Group Therapy Note   Group Date: 08/14/2022 Start Time: 1300 End Time: 1400   Type of Therapy and Topic:  Group Therapy: Boundaries  Participation Level:  Active  Description of Group: This group will address the use of boundaries in their personal lives. Patients will explore why boundaries are important, the difference between healthy and unhealthy boundaries, and negative and postive outcomes of different boundaries and will look at how boundaries can be crossed.  Patients will be encouraged to identify current boundaries in their own lives and identify what kind of boundary is being set. Facilitators will guide patients in utilizing problem-solving interventions to address and correct types boundaries being used and to address when no boundary is being used. Understanding and applying boundaries will be explored and addressed for obtaining and maintaining a balanced life. Patients will be encouraged to explore ways to assertively make their boundaries and needs known to significant others in their lives, using other group members and facilitator for role play, support, and feedback.  Therapeutic Goals:  1.  Patient will identify areas in their life where setting clear boundaries could be  used to improve their life.  2.  Patient will identify signs/triggers that a boundary is not being respected. 3.  Patient will identify two ways to set boundaries in order to achieve balance in  their lives: 4.  Patient will demonstrate ability to communicate their needs and set boundaries  through discussion and/or role plays  Summary of Patient Progress:   Patient was present for the entirety of the group session. Patient was an active listener and participated in the topic of discussion, provided helpful advice to others, and added nuance to topic of conversation. Patient shared often stating that she has gotten much better at conveying her boundaries to others. States that she believes that her  bounderies do align with her values.   Therapeutic Modalities:   Cognitive Behavioral Therapy Solution-Focused Therapy  Yvonne Pitts 08/14/2022  3:57 PM

## 2022-08-14 NOTE — Progress Notes (Signed)
Cooperative with treatment, pleasant on approach, visible in the milieu,no new behavioral issues to report on shift at this time.

## 2022-08-14 NOTE — Progress Notes (Signed)
[facility] MD Progress Note  08/14/2022 10:24 AM Yvonne Pitts  MRN:  [id number] Subjective: Yvonne Pitts is seen on rounds.  She tells me that she slept much better last night.  Her speech is not as pressured.  She seems to be more focused.  She states that she does feel better.  I spoke with her husband who thinks that she is improving.  He is afraid if she leaves too early that they will end up back in the emergency room because she does not want take her medicines at home.  I emphasized to her the need to take her medicine.  I agree with them to watch her for 1 more day and discharge her on Saturday morning.  She has had too many relapses to just discharge.  Husband agrees to pick her up on Saturday.  Principal Problem: Psychosis ([place]) Diagnosis: Principal Problem:   Psychosis ([place])  Total Time spent with patient: 15 minutes  Past Psychiatric History: None  Past Medical History:  Past Medical History:  Diagnosis Date   COPD (chronic obstructive pulmonary disease) ([place]) 11/03/2016   DDD (degenerative disc disease), lumbar    Polycythemia    Ruptured disc, cervical    Scoliosis     Past Surgical History:  Procedure Laterality Date   BREAST EXCISIONAL BIOPSY Right    years ago   DILATION AND CURETTAGE OF UTERUS     NECK SURGERY     Family History:  Family History  Problem Relation Age of Onset   Breast cancer Maternal Aunt 68   Bone cancer Paternal Aunt    Colon cancer Maternal Aunt     Social History:  Social History   Substance and Sexual Activity  Alcohol Use Not Currently   Comment: seldom maybe 2-3 times a year     Social History   Substance and Sexual Activity  Drug Use No    Social History   Socioeconomic History   Marital status: Married    Spouse name: Not on file   Number of children: Not on file   Years of education: Not on file   Highest education level: Not on file  Occupational History   Not on file  Tobacco Use   Smoking status: Every Day    Packs/day:  2.00    Years: 43.00    Total pack years: 86.00    Types: Cigarettes   Smokeless tobacco: Never  Vaping Use   Vaping Use: Never used  Substance and Sexual Activity   Alcohol use: Not Currently    Comment: seldom maybe 2-3 times a year   Drug use: No   Sexual activity: Not Currently  Other Topics Concern   Not on file  Social History Narrative   Not on file   Social Determinants of Health   Financial Resource Strain: Not on file  Food Insecurity: Not on file  Transportation Needs: Not on file  Physical Activity: Not on file  Stress: Not on file  Social Connections: Not on file   Additional Social History:                         Sleep: Negative  Appetite:  Negative  Current Medications: Current Facility-Administered Medications  Medication Dose Route Frequency Provider Last Rate Last Admin   acetaminophen (TYLENOL) tablet 500 mg  500 mg Oral Daily Yvonne Pitts, Yvonne T, MD   500 mg at 08/14/22 1019   acetaminophen (TYLENOL) tablet 650 mg  650  mg Oral Q6H PRN Yvonne Pitts, Yvonne T, MD   650 mg at 08/13/22 1226   albuterol (VENTOLIN HFA) 108 (90 Base) MCG/ACT inhaler 2 puff  2 puff Inhalation Q6H PRN Yvonne Pitts, Yvonne T, MD       alum & mag hydroxide-simeth (MAALOX/MYLANTA) 200-200-20 MG/5ML suspension 30 mL  30 mL Oral Q4H PRN Yvonne Pitts, Yvonne T, MD   30 mL at 08/10/22 0602   amLODipine (NORVASC) tablet 2.5 mg  2.5 mg Oral Daily Yvonne Pitts, Yvonne T, MD   2.5 mg at 08/14/22 1018   citalopram (CELEXA) tablet 20 mg  20 mg Oral Daily Yvonne Pitts, Yvonne T, MD   20 mg at 08/14/22 1019   cyanocobalamin (VITAMIN B12) tablet 1,000 mcg  1,000 mcg Oral Daily Yvonne Pitts, Yvonne T, MD   1,000 mcg at 08/14/22 1018   cyanocobalamin (VITAMIN B12) tablet 500 mcg  500 mcg Oral Daily Yvonne Pitts, Yvonne T, MD   500 mcg at 08/14/22 1018   cyclobenzaprine (FLEXERIL) tablet 5 mg  5 mg Oral TID PRN Yvonne Pitts, Yvonne Reno, MD       lamoTRIgine (LAMICTAL) tablet 50 mg  50 mg Oral [id number] Yvonne Veselka Edward, DO   50 mg at  08/14/22 1019   LORazepam (ATIVAN) tablet 0.5 mg  0.5 mg Oral Q4H PRN Yvonne Ranger, DO   0.5 mg at 08/12/22 1411   LORazepam (ATIVAN) tablet 1 mg  1 mg Oral QHS PRN Yvonne Pitts, Yvonne T, MD   1 mg at 08/12/22 2129   magnesium hydroxide (MILK OF MAGNESIA) suspension 30 mL  30 mL Oral Daily PRN Yvonne Pitts, Yvonne T, MD       melatonin tablet 5 mg  5 mg Oral QHS Yvonne Pitts, Yvonne Pitts   5 mg at 08/13/22 2133   mometasone-formoterol (DULERA) 200-5 MCG/ACT inhaler 2 puff  2 puff Inhalation BID Yvonne Pitts, Yvonne Reno, MD   2 puff at 08/13/22 2134   pantoprazole (PROTONIX) EC tablet 40 mg  40 mg Oral Daily Yvonne Pitts, Yvonne T, MD   40 mg at 08/14/22 1018   QUEtiapine (SEROQUEL) tablet 150 mg  150 mg Oral QHS Yvonne Ranger, DO   150 mg at 08/13/22 2133    Lab Results: No results found for this or any previous visit (from the past 48 hour(s)).  Blood Alcohol level:  Lab Results  Component Value Date   ETH <10 08/07/2022   ETH <10 61/44/3154    Metabolic Disorder Labs: Lab Results  Component Value Date   HGBA1C 5.4 08/09/2022   MPG 108.28 08/09/2022   No results found for: "PROLACTIN" Lab Results  Component Value Date   CHOL 182 08/09/2022   TRIG 106 08/09/2022   HDL 65 08/09/2022   CHOLHDL 2.8 08/09/2022   VLDL 21 08/09/2022   LDLCALC 96 08/09/2022    Physical Findings: AIMS:  , ,  ,  ,    CIWA:    COWS:     Musculoskeletal: Strength & Muscle Tone: within normal limits Gait & Station: normal Patient leans: N/A  Psychiatric Specialty Exam:  Presentation  General Appearance: Bizarre  Eye Contact:Good  Speech:Clear and Coherent  Speech Volume:Normal  Handedness:No data recorded  Mood and Affect  Mood:Anxious; Labile; Euphoric  Affect:Congruent   Thought Process  Thought Processes:Disorganized  Descriptions of Associations:Loose  Orientation:Partial  Thought Content:Illogical  History of Schizophrenia/Schizoaffective disorder:No  Duration of Psychotic  Symptoms:No data recorded Hallucinations:No data recorded Ideas of Reference:None  Suicidal Thoughts:No data recorded Homicidal Thoughts:No data recorded  Sensorium  Memory:Immediate Poor; Recent Poor  Judgment:Impaired  Insight:Lacking   Executive Functions  Concentration:Poor  Attention Span:Poor  Recall:Poor  [employer] of Knowledge:Poor  Language:Poor   Psychomotor Activity  Psychomotor Activity:No data recorded  Assets  Assets:Resilience; Social Support; Housing; Financial Resources/Insurance   Sleep  Sleep:No data recorded   Physical Exam: Physical Exam Vitals and nursing note reviewed.  Constitutional:      Appearance: Normal appearance. She is normal weight.  Neurological:     General: No focal deficit present.     Mental Status: She is alert and oriented to person, place, and time.  Psychiatric:        Attention and Perception: Attention and perception normal.        Mood and Affect: Mood and affect normal.        Speech: Speech normal.        Behavior: Behavior normal. Behavior is cooperative.        Thought Content: Thought content normal.        Cognition and Memory: Cognition and memory normal.        Judgment: Judgment is inappropriate.    Review of Systems  Constitutional: Negative.   HENT: Negative.    Eyes: Negative.   Respiratory: Negative.    Cardiovascular: Negative.   Gastrointestinal: Negative.   Genitourinary: Negative.   Musculoskeletal: Negative.   Skin: Negative.   Neurological: Negative.   Endo/Heme/Allergies: Negative.   Psychiatric/Behavioral: Negative.     Blood pressure (!) 111/53, pulse 61, temperature 98.2 F (36.8 C), temperature source Oral, resp. rate 18, height '5\' 4"'$  (1.626 m), weight 51.3 kg, SpO2 98 %. Body mass index is 19.4 kg/m.   Treatment Plan Summary: Daily contact with patient to assess and evaluate symptoms and progress in treatment, Medication management, and Plan continue current  medications.  [place], DO 08/14/2022, 10:24 AM

## 2022-08-15 DIAGNOSIS — F23 Brief psychotic disorder: Secondary | ICD-10-CM | POA: Diagnosis not present

## 2022-08-15 NOTE — Progress Notes (Signed)
Patient denies SI, HI, AVH, and depression. She reports that her anxiety went away when she found out she was discharging tomorrow. Patient is animated and in good spirits throughout the day. She is active on the milieu, interacts appropriately with staff and other patients, and participated in group. Patient is compliant with scheduled medications. She remains safe on the unit at this time.

## 2022-08-15 NOTE — Group Note (Signed)
[facility] LCSW Group Therapy Note   Group Date: 08/15/2022 Start Time: 1400 End Time: 1500  Type of Therapy and Topic:  Group Therapy:  Feelings around Relapse and Recovery  Participation Level:  Active   Mood: Pleasant   Description of Group:    Patients in this group will discuss emotions they experience before and after a relapse. They will process how experiencing these feelings, or avoidance of experiencing them, relates to having a relapse. Facilitator will guide patients to explore emotions they have related to recovery. Patients will be encouraged to process which emotions are more powerful. They will be guided to discuss the emotional reaction significant others in their lives may have to patients' relapse or recovery. Patients will be assisted in exploring ways to respond to the emotions of others without this contributing to a relapse.  Therapeutic Goals: Patient will identify two or more emotions that lead to relapse for them:  Patient will identify two emotions that result when they relapse:  Patient will identify two emotions related to recovery:  Patient will demonstrate ability to communicate their needs through discussion and/or role plays.   Summary of Patient Progress:  Patient was present for the entirety of the group session. Patient was an active listener and participated in the topic of discussion, provided helpful advice to others, and added nuance to topic of conversation. Patient was often off topic or anticipating conversations, consistent with hypomanic presentation.However, patient was pleasant and cooperative once redirected.   Therapeutic Modalities:   Cognitive Behavioral Therapy Solution-Focused Therapy Assertiveness Training Relapse Prevention Therapy   Durenda Hurt, [place]

## 2022-08-15 NOTE — Progress Notes (Signed)
[facility] MD Progress Note  08/15/2022 11:09 AM Yvonne Pitts  MRN:  [id number] Subjective: Yvonne Pitts continues to improve.  She has been compliant with her medication.  She denies any side effects.  She apologizes for pressuring me yesterday to be discharged.  Her speech has returned to normal.  She states that she slept well.  She is looking forward to discharge tomorrow.  Principal Problem: Psychosis ([place]) Diagnosis: Principal Problem:   Psychosis ([place])  Total Time spent with patient: 15 minutes  Past Psychiatric History: None  Past Medical History:  Past Medical History:  Diagnosis Date   COPD (chronic obstructive pulmonary disease) ([place]) 11/03/2016   DDD (degenerative disc disease), lumbar    Polycythemia    Ruptured disc, cervical    Scoliosis     Past Surgical History:  Procedure Laterality Date   BREAST EXCISIONAL BIOPSY Right    years ago   DILATION AND CURETTAGE OF UTERUS     NECK SURGERY     Family History:  Family History  Problem Relation Age of Onset   Breast cancer Maternal Aunt 34   Bone cancer Paternal Aunt    Colon cancer Maternal Aunt     Social History:  Social History   Substance and Sexual Activity  Alcohol Use Not Currently   Comment: seldom maybe 2-3 times a year     Social History   Substance and Sexual Activity  Drug Use No    Social History   Socioeconomic History   Marital status: Married    Spouse name: Not on file   Number of children: Not on file   Years of education: Not on file   Highest education level: Not on file  Occupational History   Not on file  Tobacco Use   Smoking status: Every Day    Packs/day: 2.00    Years: 43.00    Total pack years: 86.00    Types: Cigarettes   Smokeless tobacco: Never  Vaping Use   Vaping Use: Never used  Substance and Sexual Activity   Alcohol use: Not Currently    Comment: seldom maybe 2-3 times a year   Drug use: No   Sexual activity: Not Currently  Other Topics Concern   Not on file   Social History Narrative   Not on file   Social Determinants of Health   Financial Resource Strain: Not on file  Food Insecurity: Not on file  Transportation Needs: Not on file  Physical Activity: Not on file  Stress: Not on file  Social Connections: Not on file   Additional Social History:                         Sleep: Good  Appetite:  Good  Current Medications: Current Facility-Administered Medications  Medication Dose Route Frequency Provider Last Rate Last Admin   acetaminophen (TYLENOL) tablet 650 mg  650 mg Oral Q6H PRN Clapacs, John T, MD   650 mg at 08/14/22 2130   albuterol (VENTOLIN HFA) 108 (90 Base) MCG/ACT inhaler 2 puff  2 puff Inhalation Q6H PRN Clapacs, John T, MD       alum & mag hydroxide-simeth (MAALOX/MYLANTA) 200-200-20 MG/5ML suspension 30 mL  30 mL Oral Q4H PRN Clapacs, John T, MD   30 mL at 08/10/22 0602   amLODipine (NORVASC) tablet 2.5 mg  2.5 mg Oral Daily Clapacs, John T, MD   2.5 mg at 08/15/22 0958   citalopram (CELEXA) tablet 20 mg  20 mg Oral Daily Clapacs, Madie Reno, MD   20 mg at 08/15/22 3300   cyanocobalamin (VITAMIN B12) tablet 1,000 mcg  1,000 mcg Oral Daily Clapacs, Madie Reno, MD   1,000 mcg at 08/15/22 1001   cyanocobalamin (VITAMIN B12) tablet 500 mcg  500 mcg Oral Daily Clapacs, Madie Reno, MD   500 mcg at 08/15/22 7622   cyclobenzaprine (FLEXERIL) tablet 5 mg  5 mg Oral TID PRN Clapacs, Madie Reno, MD       lamoTRIgine (LAMICTAL) tablet 50 mg  50 mg Oral BH-q8a4p Parks Ranger, DO   50 mg at 08/15/22 0957   LORazepam (ATIVAN) tablet 0.5 mg  0.5 mg Oral Q4H PRN Parks Ranger, DO   0.5 mg at 08/12/22 1411   LORazepam (ATIVAN) tablet 1 mg  1 mg Oral QHS PRN Clapacs, Madie Reno, MD   1 mg at 08/12/22 2129   magnesium hydroxide (MILK OF MAGNESIA) suspension 30 mL  30 mL Oral Daily PRN Clapacs, Madie Reno, MD       melatonin tablet 5 mg  5 mg Oral QHS [name], RPH   5 mg at 08/14/22 2129   mometasone-formoterol (DULERA) 200-5  MCG/ACT inhaler 2 puff  2 puff Inhalation BID Clapacs, Madie Reno, MD   2 puff at 08/15/22 0957   pantoprazole (PROTONIX) EC tablet 40 mg  40 mg Oral Daily Clapacs, Madie Reno, MD   40 mg at 08/15/22 0957   QUEtiapine (SEROQUEL) tablet 150 mg  150 mg Oral QHS Parks Ranger, DO   150 mg at 08/14/22 2130    Lab Results: No results found for this or any previous visit (from the past 48 hour(s)).  Blood Alcohol level:  Lab Results  Component Value Date   ETH <10 08/07/2022   ETH <10 63/33/5456    Metabolic Disorder Labs: Lab Results  Component Value Date   HGBA1C 5.4 08/09/2022   MPG 108.28 08/09/2022   No results found for: "PROLACTIN" Lab Results  Component Value Date   CHOL 182 08/09/2022   TRIG 106 08/09/2022   HDL 65 08/09/2022   CHOLHDL 2.8 08/09/2022   VLDL 21 08/09/2022   LDLCALC 96 08/09/2022    Physical Findings: AIMS:  , ,  ,  ,    CIWA:    COWS:     Musculoskeletal: Strength & Muscle Tone: within normal limits Gait & Station: normal Patient leans: N/A  Psychiatric Specialty Exam:  Presentation  General Appearance: Bizarre  Eye Contact:Good  Speech:Clear and Coherent  Speech Volume:Normal  Handedness:No data recorded  Mood and Affect  Mood:Anxious; Labile; Euphoric  Affect:Congruent   Thought Process  Thought Processes:Disorganized  Descriptions of Associations:Loose  Orientation:Partial  Thought Content:Illogical  History of Schizophrenia/Schizoaffective disorder:No  Duration of Psychotic Symptoms:No data recorded Hallucinations:No data recorded Ideas of Reference:None  Suicidal Thoughts:No data recorded Homicidal Thoughts:No data recorded  Sensorium  Memory:Immediate Poor; Recent Poor  Judgment:Impaired  Insight:Lacking   Executive Functions  Concentration:Poor  Attention Span:Poor  Recall:Poor  Fund of Knowledge:Poor  Language:Poor   Psychomotor Activity  Psychomotor Activity:No data recorded  Assets   Assets:Resilience; Social Support; Housing; Financial Resources/Insurance   Sleep  Sleep:No data recorded   Physical Exam: Physical Exam Vitals and nursing note reviewed.  Constitutional:      Appearance: Normal appearance. She is normal weight.  Neurological:     General: No focal deficit present.     Mental Status: She is alert and oriented to person, place,  and time.  Psychiatric:        Attention and Perception: Attention and perception normal.        Mood and Affect: Mood and affect normal.        Speech: Speech normal.        Behavior: Behavior normal. Behavior is cooperative.        Thought Content: Thought content normal.    Review of Systems  Constitutional: Negative.   HENT: Negative.    Eyes: Negative.   Respiratory: Negative.    Cardiovascular: Negative.   Gastrointestinal: Negative.   Genitourinary: Negative.   Musculoskeletal: Negative.   Skin: Negative.   Neurological: Negative.   Endo/Heme/Allergies: Negative.   Psychiatric/Behavioral: Negative.     Blood pressure 113/69, pulse 64, temperature 97.9 F (36.6 C), temperature source Oral, resp. rate 18, height '5\' 4"'$  (1.626 m), weight 51.3 kg, SpO2 98 %. Body mass index is 19.4 kg/m.   Treatment Plan Summary: Daily contact with patient to assess and evaluate symptoms and progress in treatment, Medication management, and Plan continue current medications.  Discharge tomorrow.   [place], DO 08/15/2022, 11:09 AM

## 2022-08-16 DIAGNOSIS — F23 Brief psychotic disorder: Secondary | ICD-10-CM | POA: Diagnosis not present

## 2022-08-16 MED ORDER — CITALOPRAM HYDROBROMIDE 20 MG PO TABS: 20.0000 mg | ORAL_TABLET | Freq: Every day | ORAL | 3 refills | Status: DC

## 2022-08-16 MED ORDER — LAMOTRIGINE 25 MG PO TABS: 50.0000 mg | ORAL_TABLET | ORAL | 3 refills | Status: DC

## 2022-08-16 MED ORDER — LORAZEPAM 0.5 MG PO TABS: 0.5000 mg | ORAL_TABLET | ORAL | 0 refills | Status: DC | PRN

## 2022-08-16 MED ORDER — QUETIAPINE FUMARATE 50 MG PO TABS: 150.0000 mg | ORAL_TABLET | Freq: Every day | ORAL | 3 refills | Status: DC

## 2022-08-16 NOTE — Progress Notes (Signed)
  [facility] Adult Case Management Discharge Plan :  Will you be returning to the same living situation after discharge:  Yes,  Patient to return to place of residence with spouse.   At discharge, do you have transportation home?: Yes,  Patient's spouse to provide transportation from hospital. Do you have the ability to pay for your medications: Yes,  [employer] Medicare  Patient informed of right to appeal discharge, provided phone number to [facility]. Patient expressed no interest in appealing discharge at this time. CSW will continue to monitor situation.  Release of information consent forms completed and in the chart;  Patient's signature needed at discharge.  Patient to Follow up at:  Follow-up Information     [facility]. Go on 10/06/2022.   Specialty: Behavioral Health Why: Please present for scheduled appontment on 10/9 at 0800 am to complete new patient paperwork. Your appointment is for 0830 am. Expect new patient paperwork emailed from [email or web address] Contact information: [place] [phone]                Next level of care provider has access to [place] and Suicide Prevention discussed: Yes,  SPE completed with patient and Daughter, Shannan Harper, [phone]  CSW completed SPE with patient. Discussed potential triggers leading to suicidal ideation in addition to coping skills one might use in order to delay and distract self from self harming behaviors. CSW encouraged patient to utilize emergency services if they felt unable to maintain their safety. SPE flyer provided to patient at this time.    Has patient been referred to the Quitline?: Patient refused referral Tobacco Use: High Risk (08/08/2022)   Patient History    Smoking Tobacco Use: Every Day    Smokeless Tobacco Use: Never    Passive Exposure: Not on file   Patient has been  referred for addiction treatment: N/A Patient denies active substance use, UDS Negative for all, screened low risk during nursing admission (see [facility] quick tab).  Social History   Substance and Sexual Activity  Drug Use No   Social History   Substance and Sexual Activity  Alcohol Use Not Currently   Comment: seldom maybe 2-3 times a year    Durenda Hurt, Latanya Presser 08/16/2022, 11:04 AM

## 2022-08-16 NOTE — BH IP Treatment Plan (Signed)
Interdisciplinary Treatment and Diagnostic Plan Update  08/16/2022 Time of Session: 0830 DORETHEA STRUBEL MRN: [id number]  Principal Diagnosis: Psychosis [facility])  Secondary Diagnoses: Principal Problem:   Psychosis ([place])   Current Medications:  Current Facility-Administered Medications  Medication Dose Route Frequency Provider Last Rate Last Admin   acetaminophen (TYLENOL) tablet 650 mg  650 mg Oral Q6H PRN Clapacs, John T, MD   650 mg at 08/16/22 0214   albuterol (VENTOLIN HFA) 108 (90 Base) MCG/ACT inhaler 2 puff  2 puff Inhalation Q6H PRN Clapacs, John T, MD       alum & mag hydroxide-simeth (MAALOX/MYLANTA) 200-200-20 MG/5ML suspension 30 mL  30 mL Oral Q4H PRN Clapacs, John T, MD   30 mL at 08/10/22 0602   amLODipine (NORVASC) tablet 2.5 mg  2.5 mg Oral Daily Clapacs, John T, MD   2.5 mg at 08/15/22 1601   citalopram (CELEXA) tablet 20 mg  20 mg Oral Daily Clapacs, Madie Reno, MD   20 mg at 08/16/22 0940   cyanocobalamin (VITAMIN B12) tablet 1,000 mcg  1,000 mcg Oral Daily Clapacs, Madie Reno, MD   1,000 mcg at 08/16/22 0932   cyanocobalamin (VITAMIN B12) tablet 500 mcg  500 mcg Oral Daily Clapacs, Madie Reno, MD   500 mcg at 08/16/22 0940   cyclobenzaprine (FLEXERIL) tablet 5 mg  5 mg Oral TID PRN Clapacs, Madie Reno, MD       lamoTRIgine (LAMICTAL) tablet 50 mg  50 mg Oral [id number] Parks Ranger, DO   50 mg at 08/16/22 0940   LORazepam (ATIVAN) tablet 0.5 mg  0.5 mg Oral Q4H PRN Parks Ranger, DO   0.5 mg at 08/12/22 1411   LORazepam (ATIVAN) tablet 1 mg  1 mg Oral QHS PRN Clapacs, Madie Reno, MD   1 mg at 08/12/22 2129   magnesium hydroxide (MILK OF MAGNESIA) suspension 30 mL  30 mL Oral Daily PRN Clapacs, John T, MD       melatonin tablet 5 mg  5 mg Oral QHS [name], RPH   5 mg at 08/15/22 2112   mometasone-formoterol (DULERA) 200-5 MCG/ACT inhaler 2 puff  2 puff Inhalation BID Clapacs, Madie Reno, MD   2 puff at 08/16/22 0941   pantoprazole (PROTONIX) EC tablet 40 mg  40 mg  Oral Daily Clapacs, Madie Reno, MD   40 mg at 08/16/22 0939   QUEtiapine (SEROQUEL) tablet 150 mg  150 mg Oral QHS Parks Ranger, DO   150 mg at 08/15/22 2112   PTA Medications: Medications Prior to Admission  Medication Sig Dispense Refill Last Dose   acetaminophen (TYLENOL) 500 MG tablet Take 500 mg by mouth daily.       Albuterol Sulfate (PROAIR RESPICLICK) 355 (90 Base) MCG/ACT AEPB Inhale 2 puffs into the lungs every 6 (six) hours as needed. 2 each 3    amLODipine (NORVASC) 2.5 MG tablet Take 2.5 mg by mouth daily.      asenapine (SAPHRIS) 5 MG SUBL 24 hr tablet Place 1 tablet (5 mg total) under the tongue daily as needed for up to 14 days (for sleep IF the Ativan does not work). 14 tablet 0    budesonide-formoterol (SYMBICORT) 160-4.5 MCG/ACT inhaler INHALE 2 INHALATIONS INTO THE LUNGS 2 TIMES DAILY      citalopram (CELEXA) 20 MG tablet Take 20 mg by mouth daily.      [employer] B-12 500 MCG tablet Take 500 mcg by mouth daily.  cyanocobalamin 1000 MCG tablet Take 1 tablet (1,000 mcg total) by mouth daily. 30 tablet 3    cyclobenzaprine (FLEXERIL) 5 MG tablet Take 5 mg by mouth 3 (three) times daily as needed for muscle spasms.      lamoTRIgine (LAMICTAL) 25 MG tablet Take by mouth.      LORazepam (ATIVAN) 1 MG tablet Take 1 tablet (1 mg total) by mouth at bedtime as needed for sleep. 30 tablet 0    Melatonin 5 MG TABS Take 1 tablet by mouth daily.       omeprazole (PRILOSEC) 20 MG capsule Take 20 mg by mouth daily.      QUEtiapine (SEROQUEL) 25 MG tablet Take 25 mg by mouth at bedtime.      Respiratory Therapy Supplies (FLUTTER) DEVI Use as directed 1 each 0     Patient Stressors: Other: none    Patient Strengths: Supportive family/friends   Treatment Modalities: Medication Management, Group therapy, Case management,  1 to 1 session with clinician, Psychoeducation, Recreational therapy.   Physician Treatment Plan for Primary Diagnosis: Psychosis ([place]) Long Term Goal(s):  Improvement in symptoms so as ready for discharge   Short Term Goals: Ability to identify changes in lifestyle to reduce recurrence of condition will improve Ability to verbalize feelings will improve Ability to disclose and discuss suicidal ideas Ability to demonstrate self-control will improve Ability to identify and develop effective coping behaviors will improve Ability to maintain clinical measurements within normal limits will improve Compliance with prescribed medications will improve Ability to identify triggers associated with substance abuse/mental health issues will improve  Medication Management: Evaluate patient's response, side effects, and tolerance of medication regimen.  Therapeutic Interventions: 1 to 1 sessions, Unit Group sessions and Medication administration.  Evaluation of Outcomes: Adequate for Discharge  Physician Treatment Plan for Secondary Diagnosis: Principal Problem:   Psychosis ([place])  Long Term Goal(s): Improvement in symptoms so as ready for discharge   Short Term Goals: Ability to identify changes in lifestyle to reduce recurrence of condition will improve Ability to verbalize feelings will improve Ability to disclose and discuss suicidal ideas Ability to demonstrate self-control will improve Ability to identify and develop effective coping behaviors will improve Ability to maintain clinical measurements within normal limits will improve Compliance with prescribed medications will improve Ability to identify triggers associated with substance abuse/mental health issues will improve     Medication Management: Evaluate patient's response, side effects, and tolerance of medication regimen.  Therapeutic Interventions: 1 to 1 sessions, Unit Group sessions and Medication administration.  Evaluation of Outcomes: Adequate for Discharge   RN Treatment Plan for Primary Diagnosis: Psychosis ([place]) Long Term Goal(s): Knowledge of disease and therapeutic  regimen to maintain health will improve  Short Term Goals: Ability to remain free from injury will improve, Ability to verbalize frustration and anger appropriately will improve, Ability to demonstrate self-control, Ability to participate in decision making will improve, Ability to verbalize feelings will improve, Ability to disclose and discuss suicidal ideas, Ability to identify and develop effective coping behaviors will improve, and Compliance with prescribed medications will improve  Medication Management: RN will administer medications as ordered by provider, will assess and evaluate patient's response and provide education to patient for prescribed medication. RN will report any adverse and/or side effects to prescribing provider.  Therapeutic Interventions: 1 on 1 counseling sessions, Psychoeducation, Medication administration, Evaluate responses to treatment, Monitor vital signs and CBGs as ordered, Perform/monitor CIWA, COWS, AIMS and Fall Risk screenings as ordered, Perform wound  care treatments as ordered.  Evaluation of Outcomes: Adequate for Discharge   LCSW Treatment Plan for Primary Diagnosis: Psychosis [facility]) Long Term Goal(s): Safe transition to appropriate next level of care at discharge, Engage patient in therapeutic group addressing interpersonal concerns.  Short Term Goals: Engage patient in aftercare planning with referrals and resources, Increase social support, Increase ability to appropriately verbalize feelings, Increase emotional regulation, Facilitate acceptance of mental health diagnosis and concerns, Facilitate patient progression through stages of change regarding substance use diagnoses and concerns, Identify triggers associated with mental health/substance abuse issues, and Increase skills for wellness and recovery  Therapeutic Interventions: Assess for all discharge needs, 1 to 1 time with Social worker, Explore available resources and support systems, Assess for  adequacy in community support network, Educate family and significant other(s) on suicide prevention, Complete Psychosocial Assessment, Interpersonal group therapy.  Evaluation of Outcomes: Adequate for Discharge   Progress in Treatment: Attending groups: Yes. Participating in groups: Yes. Taking medication as prescribed: Yes. Toleration medication: Yes. Family/Significant other contact made: Yes, individual(s) contacted:  Daughter, Shannan Harper, [phone]  Patient understands diagnosis: Yes. Discussing patient identified problems/goals with staff: Yes. Medical problems stabilized or resolved: Yes. Denies suicidal/homicidal ideation: Yes. Issues/concerns per patient self-inventory: Yes. Other: none  New problem(s) identified: No, Describe:  none   New Short Term/Long Term Goal(s): Patient to continue working towards treatment goals after discharge. Patient no longer meets criteria for inpatient criteria per attending physician. Continue taking medications as prescribed, nursing to provide instructions at discharge. Follow up with all scheduled appointments.   Patient Goals:  No additional goals identified at this time. Patient to continue to work towards original goals identified in initial treatment team meeting. CSW will remain available to patient should they voice additional treatment goals.   Discharge Plan or Barriers: No psychosocial barriers identified at this time, patient to return to place of residence when appropriate for discharge.   Reason for Continuation of Hospitalization: NA, patient is scheduled to discharge on this day.    Scribe for Treatment Team: Larose Kells 08/16/2022 10:19 AM

## 2022-08-16 NOTE — Discharge Summary (Signed)
Physician Discharge Summary Note  Patient:  Yvonne Pitts is an 66 y.o., female MRN:  [id number] DOB:  15-Oct-1956 Patient phone:  [phone] (home)  Patient address:   [place] [address],  Total Time spent with patient: 1 hour  Date of Admission:  08/08/2022 Date of Discharge: 08/15/2022  Reason for Admission:  Patient has no psychiatric history. Reportedly, per Psych Consult Note, she' \\has'$  been displaying increasingly bizarre agitated behaviors with disorganized thoughts at home for several weeks and her family were unable to manage her. Patient has had neurologic workup which so far has been unrevealing. Patient is started on some medications (Saphris, Celexa, Lamictal, Seroquel) in the ER and continued on admission. On interview the patient is awake, alert and denies any mental complaints. She thinks her altered mental status was related to recent UTI, steroids and antibiotics recently prescribed to her "it messed up my thinking". She thinks she is mentally "normal". She denies any mental complaints - denies feeling depressed, anxious, unsafe, denies suicidal or homicidal thoughts, denies hallucinations. She expresses feeling frustrated of being in locked psych ward. She is asking about discharge. She denies any physical complaints. She is cooperative with staff requests and answers questions accordingly. She is upset due to being here, although stated she understood and okay with that.   Principal Problem: Psychosis [facility]) Discharge Diagnoses: Principal Problem:   Psychosis ([place])   Past Psychiatric History: None  Past Medical History:  Past Medical History:  Diagnosis Date   COPD (chronic obstructive pulmonary disease) ([place]) 11/03/2016   DDD (degenerative disc disease), lumbar    Polycythemia    Ruptured disc, cervical    Scoliosis     Past Surgical History:  Procedure Laterality Date   BREAST EXCISIONAL BIOPSY Right    years ago   DILATION AND  CURETTAGE OF UTERUS     NECK SURGERY     Family History:  Family History  Problem Relation Age of Onset   Breast cancer Maternal Aunt 22   Bone cancer Paternal Aunt    Colon cancer Maternal Aunt    Family Psychiatric  History: Unremarkable Social History:  Social History   Substance and Sexual Activity  Alcohol Use Not Currently   Comment: seldom maybe 2-3 times a year     Social History   Substance and Sexual Activity  Drug Use No    Social History   Socioeconomic History   Marital status: Married    Spouse name: Not on file   Number of children: Not on file   Years of education: Not on file   Highest education level: Not on file  Occupational History   Not on file  Tobacco Use   Smoking status: Every Day    Packs/day: 2.00    Years: 43.00    Total pack years: 86.00    Types: Cigarettes   Smokeless tobacco: Never  Vaping Use   Vaping Use: Never used  Substance and Sexual Activity   Alcohol use: Not Currently    Comment: seldom maybe 2-3 times a year   Drug use: No   Sexual activity: Not Currently  Other Topics Concern   Not on file  Social History Narrative   Not on file   Social Determinants of Health   Financial Resource Strain: Not on file  Food Insecurity: Not on file  Transportation Needs: Not on file  Physical Activity: Not on file  Stress: Not on file  Social Connections: Not on file  Hospital Course: [name] is a 66 year old white female who was involuntarily admitted to geriatric psychiatry under routine orders and precautions.  She had frequent visits to the emergency room for worsening behavior which started approximately a few weeks ago after a UTI and a neurological workup in which she was on steroids for a week.  She was started on Lamictal and Seroquel which was titrated up to 50 mg twice a day of Lamictal and 150 mg of Seroquel.  She tolerated the medications without any problems.  Initially she had pressured speech and flight of ideas  and agitation.  All 3 of those symptoms improved.  She denied any side effects from the medication.  She started sleeping better and her insight improved.  She continued on Celexa, her blood pressure medicines and stomach medications.  She did have some episodes of agitation and Ativan did help with this.  It was felt that she maximized hospitalization she was discharged home.  On the day of discharge she denied suicidal ideation, homicidal ideation, auditory or visual hallucinations.  Her judgment and insight were good.  Physical Findings: AIMS:  , ,  ,  ,    CIWA:    COWS:     Musculoskeletal: Strength & Muscle Tone: within normal limits Gait & Station: normal Patient leans: N/A   Psychiatric Specialty Exam:  Presentation  General Appearance: Bizarre  Eye Contact:Good  Speech:Clear and Coherent  Speech Volume:Normal  Handedness:No data recorded  Mood and Affect  Mood:Anxious; Labile; Euphoric  Affect:Congruent   Thought Process  Thought Processes:Disorganized  Descriptions of Associations:Loose  Orientation:Partial  Thought Content:Illogical  History of Schizophrenia/Schizoaffective disorder:No  Duration of Psychotic Symptoms:No data recorded Hallucinations:No data recorded Ideas of Reference:None  Suicidal Thoughts:No data recorded Homicidal Thoughts:No data recorded  Sensorium  Memory:Immediate Poor; Recent Poor  Judgment:Impaired  Insight:Lacking   Executive Functions  Concentration:Poor  Attention Span:Poor  Recall:Poor  Fund of Knowledge:Poor  Language:Poor   Psychomotor Activity  Psychomotor Activity:No data recorded  Assets  Assets:Resilience; Social Support; Housing; Financial Resources/Insurance   Sleep  Sleep:No data recorded   Physical Exam: Physical Exam Vitals and nursing note reviewed.  Constitutional:      Appearance: Normal appearance. She is normal weight.  Neurological:     General: No focal deficit present.      Mental Status: She is alert and oriented to person, place, and time.  Psychiatric:        Attention and Perception: Attention and perception normal.        Mood and Affect: Mood and affect normal.        Speech: Speech normal.        Behavior: Behavior normal. Behavior is cooperative.        Thought Content: Thought content normal.        Cognition and Memory: Cognition and memory normal.        Judgment: Judgment normal.    Review of Systems  Constitutional: Negative.   HENT: Negative.    Eyes: Negative.   Respiratory: Negative.    Cardiovascular: Negative.   Gastrointestinal: Negative.   Genitourinary: Negative.   Musculoskeletal: Negative.   Skin: Negative.   Neurological: Negative.   Endo/Heme/Allergies: Negative.   Psychiatric/Behavioral: Negative.     Blood pressure (!) 129/58, pulse 61, temperature 98 F (36.7 C), temperature source Oral, resp. rate 18, height '5\' 4"'$  (1.626 m), weight 51.3 kg, SpO2 95 %. Body mass index is 19.4 kg/m.   Social History   Tobacco Use  Smoking Status Every Day   Packs/day: 2.00   Years: 43.00   Total pack years: 86.00   Types: Cigarettes  Smokeless Tobacco Never   Tobacco Cessation:  A prescription for an FDA-approved tobacco cessation medication was offered at discharge and the patient refused   Blood Alcohol level:  Lab Results  Component Value Date   [facility] <10 08/07/2022   ETH <10 24/40/1027    Metabolic Disorder Labs:  Lab Results  Component Value Date   HGBA1C 5.4 08/09/2022   MPG 108.28 08/09/2022   No results found for: "PROLACTIN" Lab Results  Component Value Date   CHOL 182 08/09/2022   TRIG 106 08/09/2022   HDL 65 08/09/2022   CHOLHDL 2.8 08/09/2022   VLDL 21 08/09/2022   LDLCALC 96 08/09/2022    See Psychiatric Specialty Exam and Suicide Risk Assessment completed by Attending Physician prior to discharge.  Discharge destination:  Home  Is patient on multiple antipsychotic therapies at discharge:  No    Has Patient had three or more failed trials of antipsychotic monotherapy by history:  No  Recommended Plan for Multiple Antipsychotic Therapies: NA   Allergies as of 08/16/2022       Reactions   Prochlorperazine Maleate    Other reaction(s): Unknown   Ibuprofen Rash        Medication List     STOP taking these medications    acetaminophen 500 MG tablet Commonly known as: TYLENOL   asenapine 5 MG Subl 24 hr tablet Commonly known as: SAPHRIS   cyclobenzaprine 5 MG tablet Commonly known as: FLEXERIL   Flutter Devi   melatonin 5 MG Tabs       TAKE these medications      Indication  Albuterol Sulfate 108 (90 Base) MCG/ACT Aepb Commonly known as: ProAir RespiClick Inhale 2 puffs into the lungs every 6 (six) hours as needed.  Indication: Disease Involving Spasms of the Bronchus   amLODipine 2.5 MG tablet Commonly known as: NORVASC Take 2.5 mg by mouth daily.    budesonide-formoterol 160-4.5 MCG/ACT inhaler Commonly known as: SYMBICORT INHALE 2 INHALATIONS INTO THE LUNGS 2 TIMES DAILY    citalopram 20 MG tablet Commonly known as: CELEXA Take 1 tablet (20 mg total) by mouth daily.  Indication: Major Depressive Disorder   cyanocobalamin 1000 MCG tablet Take 1 tablet (1,000 mcg total) by mouth daily. What changed: Another medication with the same name was removed. Continue taking this medication, and follow the directions you see here.    lamoTRIgine 25 MG tablet Commonly known as: LAMICTAL Take 2 tablets (50 mg total) by mouth 2 (two) times daily at 8 am and 4 pm. What changed:  how much to take when to take this  Indication: Manic-Depression   LORazepam 0.5 MG tablet Commonly known as: ATIVAN Take 1 tablet (0.5 mg total) by mouth every 4 (four) hours as needed for anxiety. What changed:  medication strength how much to take when to take this reasons to take this    omeprazole 20 MG capsule Commonly known as: PRILOSEC Take 20 mg by mouth  daily.    QUEtiapine 50 MG tablet Commonly known as: SEROQUEL Take 3 tablets (150 mg total) by mouth at bedtime. What changed:  medication strength how much to take  Indication: Agitation, Manic Phase of Manic-Depression        Follow-up Information     [place]. Go on 10/06/2022.   Specialty: Behavioral Health Why: Please present for scheduled appontment  on 10/9 at 0800 am to complete new patient paperwork. Your appointment is for 0830 am. Expect new patient paperwork emailed from [email or web address] Contact information: [place] [phone]                Follow-up recommendations: [facility]     Signed: Parks Ranger, DO 08/16/2022, 11:53 AM

## 2022-08-16 NOTE — Progress Notes (Signed)
Patient is AAOx4. She denies SI, HI, AVH, anxiety, and depression. She is medication complaint and voiced no complaints to this Probation officer. She is safe on the unit at this time Q 15 minute safety checks in place.

## 2022-08-16 NOTE — Care Management Important Message (Signed)
Important Message  Patient Details  Name: Yvonne Pitts MRN: [id number] Date of Birth: 1956/07/13   Medicare Important Message Given:  Yes  Patient informed of right to appeal discharge, provided phone number to [facility]. Patient expressed no interest in appealing discharge at this time. CSW will continue to monitor situation.   Durenda Hurt, LCSWA 08/16/2022, 11:03 AM

## 2022-08-16 NOTE — Plan of Care (Signed)
Pt to be discharged today.

## 2022-08-16 NOTE — BHH Suicide Risk Assessment (Signed)
[facility] Discharge Suicide Risk Assessment   Principal Problem: Psychosis [facility]) Discharge Diagnoses: Principal Problem:   Psychosis ([place])   Total Time spent with patient: 1 hour  Musculoskeletal: Strength & Muscle Tone: within normal limits Gait & Station: normal Patient leans: N/A  Psychiatric Specialty Exam  Presentation  General Appearance: Bizarre  Eye Contact:Good  Speech:Clear and Coherent  Speech Volume:Normal  Handedness:No data recorded  Mood and Affect  Mood:Anxious; Labile; Euphoric  Duration of Depression Symptoms: Greater than two weeks  Affect:Congruent   Thought Process  Thought Processes:Disorganized  Descriptions of Associations:Loose  Orientation:Partial  Thought Content:Illogical  History of Schizophrenia/Schizoaffective disorder:No  Duration of Psychotic Symptoms:No data recorded Hallucinations:No data recorded Ideas of Reference:None  Suicidal Thoughts:No data recorded Homicidal Thoughts:No data recorded  Sensorium  Memory:Immediate Poor; Recent Poor  Judgment:Impaired  Insight:Lacking   Executive Functions  Concentration:Poor  Attention Span:Poor  Recall:Poor  Fund of Knowledge:Poor  Language:Poor   Psychomotor Activity  Psychomotor Activity:No data recorded  Assets  Assets:Resilience; Social Support; Housing; Financial Resources/Insurance   Sleep  Sleep:No data recorded   Blood pressure (!) 129/58, pulse 61, temperature 98 F (36.7 C), temperature source Oral, resp. rate 18, height '5\' 4"'$  (1.626 m), weight 51.3 kg, SpO2 95 %. Body mass index is 19.4 kg/m.  Mental Status Per Nursing Assessment::   On Admission:  NA  Demographic Factors:  Age 66 or older and Caucasian  Loss Factors: NA  Historical Factors: NA  Risk Reduction Factors:   Sense of responsibility to family, Religious beliefs about death, Living with another person, especially a relative, Positive social support, Positive therapeutic  relationship, and Positive coping skills or problem solving skills  Continued Clinical Symptoms:  Depression:   Impulsivity  Cognitive Features That Contribute To Risk:  Polarized thinking    Suicide Risk:  Minimal: No identifiable suicidal ideation.  Patients presenting with no risk factors but with morbid ruminations; may be classified as minimal risk based on the severity of the depressive symptoms   Follow-up [place]. Go on 10/06/2022.   Specialty: Behavioral Health Why: Please present for scheduled appontment on 10/9 at 0800 am to complete new patient paperwork. Your appointment is for 0830 am. Expect new patient paperwork emailed from [email or web address] Contact information: [place] [phone]                Plan Of Care/Follow-up recommendations: [place], DO 08/16/2022, 11:38 AM

## 2022-08-16 NOTE — Progress Notes (Signed)
Patient discharged from the unit per MD order. Verbalized understanding of discharge instructions. Denied SI/HI and was pleasant. Went home with spouse and son. No sign of distress upon discharge.

## 2022-08-27 ENCOUNTER — Other Ambulatory Visit (HOSPITAL_COMMUNITY): Payer: Self-pay | Admitting: Psychiatry

## 2022-08-27 NOTE — Progress Notes (Unsigned)
Psychiatric Initial Adult Assessment   Patient Identification: ROSHONDA Pitts MRN:  [id number] Date of Evaluation:  08/28/2022 Referral Source: Tracie Harrier, MD  Chief Complaint:   Chief Complaint  Patient presents with   Establish Care   Visit Diagnosis:    ICD-10-CM   1. Altered mental status, unspecified altered mental status type  R41.82     2. Major depressive disorder in full remission, unspecified whether recurrent ([place])  F32.5       History of Present Illness:   Yvonne Pitts is a 66 y.o. year old female with a history of depression, psychosis, COPD, ongoing tobacco use, DDD, polycythemia, who is referred for aftercare visit.   - Per chart review, "Yvonne Pitts is a 66 y.o. female discharged yesterday after presumptive diagnosis of autoimmune encephalitis with treatment with high-dose steroids for 5 days, was initially seen by my colleague on 07/18/2022-75 year old with extensive history of tobacco abuse, COPD, lumbar degenerative disc disease brought in for evaluation for 3 weeks of progressive behavioral change personality change and cognitive decline.  She was at her baseline 3 weeks prior to presentation, works full-time and takes care of her adult autistic son.  She started acting strangely when they went for a family vacation to the beach around 4 July holiday.  She had significant change in her personality.  Became extremely talkative, agitated, unable to sleep and became very argumentative.  This was very unlike her baseline personality.  She went to a hospital in [place] and was diagnosed with a UTI.  Started on antibiotics.  Symptoms did not improve but rather became more worse over the next few days.  Her primary care also thought she had sinusitis and antibiotics were changed.  She has received completed course for those. She was evaluated at [facility] by my colleague who obtained an MRI of the brain.  There were isolated T2/FLAIR hyperintensity in  bilateral pons as well as medial medulla with no abnormal contrast-enhancement.  No history of malignancy. Work-up completed in the form of TSH, RPR, ammonia and B12-B12 was lower from a neurological standpoint but not abnormally low.   CT angiography head and neck was done with no concern for vasculitis. Spinal tap was done which showed normal glucose, normal protein at 43 and 0 RBC and 2 WBCs.[facility] encephalopathy panel has been sent and has been reported now to be negative.   She was treated in the hospital with 5 days of IV steroids high-dose and showed some improvement although continues to be somewhat more chatty than usual.  Discharged home yesterday.  Patient returns to the emergency room today.  Husband reports that after discharge yesterday after receiving the 5 days of steroids and patient, they went home and she started smoking and drinking Coke and would not stop talking.  She did not go to bed too late at night.  Woke up very early after sleeping for a couple of hours and woke everyone in the household.  Also did not appear at her normal baseline self and kept on talking about things that made no contextual sense to what ever was going on with them in their life.  He brought her back for further evaluation."  MRI brain completed-stable changes in the pons.  No new changes.  No abnormal enhancement. Lack of response to the imaging findings to steroids makes me think of the findings of the pontine lesion are more likely small vessel disease related rather than an autoimmune inflammatory process-again also  because the CSF was bland, autoimmune encephalitis antibody panel is negative and steroids had no response.    She was treated in the hospital with 5 days of IV steroids high-dose and showed some improvement although continues to be somewhat more chatty than usual.  Discharged home yesterday.   Patient returns to the emergency room today.  Husband reports that after discharge yesterday  after receiving the 5 days of steroids and patient, they went home and she started smoking and drinking Coke and would not stop talking.  She did not go to bed too late at night.  Woke up very early after sleeping for a couple of hours and woke everyone in the household.  Also did not appear at her normal baseline self and kept on talking about things that made no contextual sense to what ever was going on with them in their life.  He brought her back for further evaluation.  Now that  autoimmune antibody panel results are available and they are completely negative, and additionally the CSF that was completely bland without any evidence of inflammation as well as treatment with high-dose steroids for 5 days did not show any good response-- this makes me question the diagnosis of a primary neurological process here.  As documented in the initial consult note by Dr. Quinn Axe, that there is a big psychiatric overlay-makes me wonder if this is a primarily psychiatric process with incidental imaging findings."  - she was admitted to [facility] in 07/2022. Per chart review, "66 year old woman who has been displaying increasingly bizarre agitated behaviors with disorganized thoughts at home which has been occurring for several weeks." - Per chart review, she was seen by neurologist on 8/1. 1. "Disinhibition, word-finding difficulty, altered mental status, changes in personality (argumentative, poor judgment), insomnia, visual hallucinations, worsening hunched forward posture, decreased appetite, onset July 2023 after UTI in patient with history of depression, negative lumbar puncture, negative CSF autoimmune panel, normal routine EEG, unremarkable CT head, MRI Brain showing abnormal T2/FLAIR hyperintensities in pons and medulla. Need to rule out autoimmune encephalopathy (but negative csf labs) Creutzfeldt-Jakob disease (CJD) vs rhombencephalitis."   She states that she was admitted twice, and was advised to be seen by a  psychiatrist.  She states that she was sick for a while when she was at the beach, and this "made me crazy."  She was "not acting right."  She remembers she had insomnia, not able to watch TV or read books as she "just couldn't read."  She also experiences difficulty in speech.  She needed to wait for second 2 minutes to talk, although she has worse in her mind, and was able to comprehend things.  She eventually lost her voice.  She talks about an episode of her leaving him and notes when he was asleep to communicate with him, although she later found out it did not make any sense. She had "spurts" of energy, and had insomnia, decrease in appetite. She has been doing better since discharge from the hospital.  She has difficulty in concentration.  She admits that she is unable to complete tasks.  She denies any significant mood symptoms except she was stressed years ago around the situation of her parents.  She denies feeling depressed otherwise.  She denies AH, VH, paranoia, ideas of reference.  She denies any substance use, herbal use, or over the counter medication.  She reports good relationship with her family including her son with autism.  He has been doing better, and he  has been communicative.  She enjoys interacting with her daughter and her grandchild as well.   Juanda Crumble, her husband presents to the interview.  He states that she was sleeping only 3-4 hours, not eating, restless, and talking nonsense prior to admission.  She was loud, although she was not physically aggressive. These started around the end of June.  He denies any similar episode in the past. He states that although he does not think she is all the way back, she has been doing better since discharge.  He recognizes that she has difficulty in concentration and completing tasks, although she used to be this way ("not to this extreme.")    ADL/IADL independent    Medication- citalopram 20 mg daily, quetiapine 150 mg at night,  lamotrigine 50 mg twice a day, lorazepam 0.5 mg qidprn for anxiety  Lost from 125 lbs to 113 lbs in the past Wt Readings from Last 3 Encounters:  08/28/22 117 lb 9.6 oz (53.3 kg)  08/08/22 113 lb (51.3 kg)  08/07/22 117 lb 15.1 oz (53.5 kg)    Household: husband, son with autism, 43 yo  Marital status: married Number of children: 2 (son, daughter in [place]) Employment: unemployed, retired , 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  Consequences of Substance Abuse: NA  Past Medical History:  Past Medical History:  Diagnosis Date   COPD (chronic obstructive pulmonary disease) ([place]) 11/03/2016   DDD (degenerative disc disease), lumbar    Polycythemia    Ruptured disc, cervical    Scoliosis     Past Surgical History:  Procedure Laterality Date   BREAST EXCISIONAL BIOPSY Right    years ago   DILATION AND CURETTAGE OF UTERUS     NECK SURGERY      Family Psychiatric History: denies  Family History:  Family History  Problem Relation Age of Onset   Anxiety disorder Brother    Breast cancer Maternal Aunt 40   Colon cancer Maternal Aunt    Bone cancer Paternal Aunt     Social History:   Social History   Socioeconomic History   Marital status: Married    Spouse name: Not on file   Number of children: 2   Years of education: Not on file   Highest education level: Some college, no degree  Occupational  History   Not on file  Tobacco Use   Smoking status: Every Day    Packs/day: 2.00    Years: 43.00    Total pack years: 86.00    Types: Cigarettes   Smokeless tobacco: Never  Vaping Use   Vaping Use: Never used  Substance and Sexual Activity   Alcohol use: Not Currently    Comment: seldom maybe 2-3 times a year   Drug use: No   Sexual activity: Not Currently  Other Topics Concern   Not on file  Social History Narrative   Not on file   Social Determinants of Health   Financial Resource Strain: Not  on file  Food Insecurity: Not on file  Transportation Needs: Not on file  Physical Activity: Not on file  Stress: Not on file  Social Connections: Not on file    Additional Social History: as above  Allergies:   Allergies  Allergen Reactions   Prochlorperazine Maleate     Other reaction(s): Unknown   Ibuprofen Rash    Metabolic Disorder Labs: Lab Results  Component Value Date   HGBA1C 5.4 08/09/2022   MPG 108.28 08/09/2022   No results found for: "PROLACTIN" Lab Results  Component Value Date   CHOL 182 08/09/2022   TRIG 106 08/09/2022   HDL 65 08/09/2022   CHOLHDL 2.8 08/09/2022   VLDL 21 08/09/2022   LDLCALC 96 08/09/2022   Lab Results  Component Value Date   TSH 3.714 07/25/2022    Therapeutic Level Labs: No results found for: "LITHIUM" No results found for: "CBMZ" No results found for: "VALPROATE"  Current Medications: Current Outpatient Medications  Medication Sig Dispense Refill   Albuterol Sulfate (PROAIR RESPICLICK) 161 (90 Base) MCG/ACT AEPB Inhale 2 puffs into the lungs every 6 (six) hours as needed. 2 each 3   amLODipine (NORVASC) 2.5 MG tablet Take 2.5 mg by mouth daily.     amLODipine (NORVASC) 2.5 MG tablet Take 1 tablet by mouth daily.     budesonide-formoterol (SYMBICORT) 160-4.5 MCG/ACT inhaler INHALE 2 INHALATIONS INTO THE LUNGS 2 TIMES DAILY     citalopram (CELEXA) 20 MG tablet Take 1 tablet (20 mg total) by mouth daily. 30 tablet 3    cyanocobalamin 1000 MCG tablet Take 1 tablet (1,000 mcg total) by mouth daily. 30 tablet 3   lamoTRIgine (LAMICTAL) 25 MG tablet Take 2 tablets (50 mg total) by mouth 2 (two) times daily at 8 am and 4 pm. 60 tablet 3   LORazepam (ATIVAN) 0.5 MG tablet Take 1 tablet (0.5 mg total) by mouth every 4 (four) hours as needed for anxiety. 30 tablet 0   omeprazole (PRILOSEC) 20 MG capsule Take 20 mg by mouth daily.     QUEtiapine (SEROQUEL) 50 MG tablet Take 3 tablets (150 mg total) by mouth at bedtime. 90 tablet 3   No current facility-administered medications for this visit.    Musculoskeletal: Strength & Muscle Tone: within normal limits Gait & Station: normal Patient leans: N/A  Psychiatric Specialty Exam: Review of Systems  Psychiatric/Behavioral: Negative.    All other systems reviewed and are negative.   Blood pressure (!) 147/77, pulse 80, temperature 97.9 F (36.6 C), temperature source Temporal, weight 117 lb 9.6 oz (53.3 kg).Body mass index is 20.19 kg/m.  General Appearance: Fairly Groomed  Eye Contact:  Good  Speech:  Clear and Coherent  Volume:  Normal  Mood:   good  Affect:  Appropriate, Congruent, and Full Range  Thought Process:  Coherent  Orientation:  Full (Time, Place, and Person)  Thought Content:  Logical  Suicidal Thoughts:  No  Homicidal Thoughts:  No  Memory:  Immediate;   Good  Judgement:  Good  Insight:  Good  Psychomotor Activity:  Normal  Concentration:  Concentration: Good and Attention Span: Good  Recall:  Good  Fund of Knowledge:Good  Language: Good  Akathisia:  No  Handed:  Right  AIMS (if indicated):  not done  Assets:  Communication Skills Desire for Improvement  ADL's:  Intact  Cognition: WNL  Sleep:  Good   Screenings: AUDIT    Flowsheet Row Admission (Discharged) from 08/08/2022 in [facility]  MEDICINE  Alcohol Use Disorder Identification Test Final Score (AUDIT) 0      GAD-7    Flowsheet Row Office Visit from  08/28/2022 in [place]  Total GAD-7 Score 2      PHQ2-9    [place] Office Visit from 08/28/2022 in [facility]  PHQ-2 Total Score 0      [place] Admission (Discharged) from 08/08/2022 in [facility] from 08/07/2022 in [place] from 07/25/2022 in [place] CATEGORY No Risk No Risk No Risk       Assessment and Plan:  SHAMARI TROSTEL is a 66 y.o. year old female with a history of depression, psychosis, COPD, ongoing tobacco use, DDD, polycythemia, who is referred for aftercare visit. - she was admitted to [facility] in 07/2022. Per chart review, "66 year old woman who has been displaying increasingly bizarre agitated behaviors with disorganized thoughts at home which has been occurring for several weeks." Admitted twice,   1. Altered mental status, unspecified altered mental status type- resolving Exam is notable for euthymic affect, smiling appropriately , and she demonstrates logical thought process and good recall regarding the evaluation/treatment she has received during the admission.  She had subacute onset of altered mental status with behavior changes, which includes insomnia, disorganized thought process, along with word-finding difficulty, which has improved after hospitalization.  The only residual symptoms are difficulty in concentration at this time.  She is under evaluation by neurologist.  Discussed with the patient and her husband that it is atypical for psychiatric condition to cause this relatively abrupt onset for altered mental status.  Although the followings are less likely, differential includes bipolar disorder, dementia and CJD.  She denies any substance, herbs, or over-the-counter medication except Tylenol.  Will continue current dose of quetiapine and lamotrigine at this time given  it has been beneficial for her symptoms without obvious adverse reaction.  Will taper off lorazepam to avoid possible side effect of drowsiness/polypharmacy.  She and her husband was advised to contact the office if any worsening in her symptoms.   2. Major depressive disorder in full remission, unspecified whether recurrent ([place]) She denies any significant mood symptoms preceding to this episode of altered mental status.  She reports very good relationship with her family including her son with autism.  She has been stable mood at least for many years, being on citalopram.  Will continue current dose to target depression.   Plan Continue citalopram 20 mg daily Continue quetiapine 150 mg at night Continue lamotrigine 50 mg twice a day Decrease lorazepam by 0.5 mg per day (was taking 0.5 mg four times a day since admission, which was tapered down to 0.5 mg three times a day) Next appointment: 10/26 at 10 AM for 30 mins, in person   The patient demonstrates the following risk factors for suicide: Chronic risk factors for suicide include: psychiatric disorder of depression . Acute risk factors for suicide include: N/A. Protective factors for this patient include: positive social support, responsibility to others (children, family), coping skills, and hope for the future. Considering these factors, the overall suicide risk at this point appears to be low. Patient is appropriate for outpatient follow up.     Collaboration of Care: Other reviewed notes in Epic  Patient/Guardian was advised Release of Information must be obtained prior to any record release in order to collaborate their care with an outside  provider. Patient/Guardian was advised if they have not already done so to contact the registration department to sign all necessary forms in order for [place] to release information regarding their care.   Consent: Patient/Guardian gives verbal consent for treatment and assignment of benefits for  services provided during this visit. Patient/Guardian expressed understanding and agreed to proceed.    The duration of this appointment visit was 45 minutes of face-to-face time with the patient.  Greater than 50% of this time was spent in counseling, explanation of  diagnosis, planning of further management, and coordination of care.   Norman Clay, MD 8/31/202311:33 AM

## 2022-08-28 ENCOUNTER — Ambulatory Visit: Payer: Medicare HMO | Admitting: Psychiatry

## 2022-08-28 ENCOUNTER — Encounter: Payer: Self-pay | Admitting: Psychiatry

## 2022-08-28 VITALS — BP 147/77 | HR 80 | Temp 97.9°F | Wt 117.6 lb

## 2022-08-28 DIAGNOSIS — R4182 Altered mental status, unspecified: Secondary | ICD-10-CM

## 2022-08-28 DIAGNOSIS — F325 Major depressive disorder, single episode, in full remission: Secondary | ICD-10-CM

## 2022-08-28 NOTE — Patient Instructions (Signed)
Continue citalopram 20 mg daily Continue quetiapine 150 mg at night Continue lamotrigine 50 mg twice a day Decrease lorazepam by 0.5 mg per day  Next appointment: 10/26 at 10 AM

## 2022-09-11 ENCOUNTER — Inpatient Hospital Stay (HOSPITAL_BASED_OUTPATIENT_CLINIC_OR_DEPARTMENT_OTHER): Payer: Medicare HMO | Admitting: Oncology

## 2022-09-11 ENCOUNTER — Encounter: Payer: Self-pay | Admitting: Oncology

## 2022-09-11 ENCOUNTER — Inpatient Hospital Stay: Payer: Medicare HMO | Attending: Oncology

## 2022-09-11 VITALS — BP 140/59 | HR 65 | Temp 98.7°F | Resp 18 | Wt 116.1 lb

## 2022-09-11 DIAGNOSIS — D75839 Thrombocytosis, unspecified: Secondary | ICD-10-CM

## 2022-09-11 DIAGNOSIS — Z803 Family history of malignant neoplasm of breast: Secondary | ICD-10-CM | POA: Diagnosis not present

## 2022-09-11 DIAGNOSIS — I700 Atherosclerosis of aorta: Secondary | ICD-10-CM | POA: Insufficient documentation

## 2022-09-11 DIAGNOSIS — Z79899 Other long term (current) drug therapy: Secondary | ICD-10-CM | POA: Diagnosis not present

## 2022-09-11 DIAGNOSIS — Z87891 Personal history of nicotine dependence: Secondary | ICD-10-CM

## 2022-09-11 DIAGNOSIS — J432 Centrilobular emphysema: Secondary | ICD-10-CM | POA: Insufficient documentation

## 2022-09-11 DIAGNOSIS — F17210 Nicotine dependence, cigarettes, uncomplicated: Secondary | ICD-10-CM | POA: Insufficient documentation

## 2022-09-11 DIAGNOSIS — D751 Secondary polycythemia: Secondary | ICD-10-CM

## 2022-09-11 DIAGNOSIS — Z800 Family history of malignant neoplasm of digestive organs: Secondary | ICD-10-CM | POA: Insufficient documentation

## 2022-09-11 DIAGNOSIS — M419 Scoliosis, unspecified: Secondary | ICD-10-CM | POA: Insufficient documentation

## 2022-09-11 DIAGNOSIS — M5136 Other intervertebral disc degeneration, lumbar region: Secondary | ICD-10-CM | POA: Insufficient documentation

## 2022-09-11 LAB — CBC WITH DIFFERENTIAL/PLATELET
Abs Immature Granulocytes: 0.04 10*3/uL (ref 0.00–0.07)
Basophils Absolute: 0.1 10*3/uL (ref 0.0–0.1)
Basophils Relative: 1 %
Eosinophils Absolute: 0.2 10*3/uL (ref 0.0–0.5)
Eosinophils Relative: 2 %
HCT: 40.4 % (ref 36.0–46.0)
Hemoglobin: 13.1 g/dL (ref 12.0–15.0)
Immature Granulocytes: 1 %
Lymphocytes Relative: 19 %
Lymphs Abs: 1.6 10*3/uL (ref 0.7–4.0)
MCH: 31.6 pg (ref 26.0–34.0)
MCHC: 32.4 g/dL (ref 30.0–36.0)
MCV: 97.3 fL (ref 80.0–100.0)
Monocytes Absolute: 0.6 10*3/uL (ref 0.1–1.0)
Monocytes Relative: 7 %
Neutro Abs: 6.4 10*3/uL (ref 1.7–7.7)
Neutrophils Relative %: 70 %
Platelets: 341 10*3/uL (ref 150–400)
RBC: 4.15 MIL/uL (ref 3.87–5.11)
RDW: 14 % (ref 11.5–15.5)
WBC: 8.8 10*3/uL (ref 4.0–10.5)
nRBC: 0 % (ref 0.0–0.2)

## 2022-09-11 LAB — COMPREHENSIVE METABOLIC PANEL
ALT: 15 U/L (ref 0–44)
AST: 28 U/L (ref 15–41)
Albumin: 3.8 g/dL (ref 3.5–5.0)
Alkaline Phosphatase: 55 U/L (ref 38–126)
Anion gap: 5 (ref 5–15)
BUN: 7 mg/dL — ABNORMAL LOW (ref 8–23)
CO2: 26 mmol/L (ref 22–32)
Calcium: 8.7 mg/dL — ABNORMAL LOW (ref 8.9–10.3)
Chloride: 106 mmol/L (ref 98–111)
Creatinine, Ser: 0.41 mg/dL — ABNORMAL LOW (ref 0.44–1.00)
GFR, Estimated: >60 mL/min (ref >60)
Glucose, Bld: 90 mg/dL (ref 70–99)
Potassium: 3.8 mmol/L (ref 3.5–5.1)
Sodium: 137 mmol/L (ref 135–145)
Total Bilirubin: 0.3 mg/dL (ref 0.3–1.2)
Total Protein: 6.5 g/dL (ref 6.5–8.1)

## 2022-09-11 NOTE — Progress Notes (Signed)
Pt reports that she has been in the hospital a couple of times since last visit. Pt has been having short term memory issues.

## 2022-09-11 NOTE — Progress Notes (Signed)
Hematology/Oncology Progress note [phone] [phone]      Patient Care Team: Tracie Harrier, MD as PCP - General (Internal Medicine)  REFERRING PROVIDER: Tracie Harrier, MD  CHIEF COMPLAINTS/REASON FOR VISIT:  Follow up for thrombocytosis erythrocytosis  HISTORY OF PRESENTING ILLNESS:  Yvonne Pitts is a 66 y.o. female who was seen in consultation at the request of Tracie Harrier, MD for follow up thrombocytosis.  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 Elevated carbon Monoxide level,  JAK2 V617F, w Reflex to CALR/E12/MPL negative.   11/28/2019 CT chest Lung cancer screening showed benign appearance. INTERVAL HISTORY Yvonne Pitts is a 66 y.o. female who has above history reviewed by me today presents for follow up visit for thrombocytosis and erythrocytosis.   Patient smokes 2 packs daily.  No new complaints. Denies any shortness of breath, cough  07/19/22 CT chest abdomen pelvis w contrast  1. No acute findings within the chest, abdomen or pelvis.2. No evidence of malignancy. 3. Several small lung nodules without change from the prior CT chest, lung cancer screening exam. Please refer to that study for recommendations.  Review of Systems  Constitutional:  Negative for appetite change, chills, fatigue and fever.  HENT:   Negative for hearing loss and voice change.   Eyes:  Negative for eye problems.  Respiratory:  Negative for chest tightness and cough.   Cardiovascular:  Negative for chest pain.  Gastrointestinal:  Negative for abdominal distention, abdominal pain and blood in stool.  Endocrine: Negative for hot flashes.  Genitourinary:  Negative for difficulty urinating and frequency.   Musculoskeletal:  Negative for arthralgias.  Skin:  Negative for itching and rash.  Neurological:  Negative for extremity weakness.  Hematological:  Negative for adenopathy.  Psychiatric/Behavioral:  Negative for confusion. The patient is not  nervous/anxious.     MEDICAL HISTORY:  Past Medical History:  Diagnosis Date   COPD (chronic obstructive pulmonary disease) ([place]) 11/03/2016   DDD (degenerative disc disease), lumbar    Polycythemia    Ruptured disc, cervical    Scoliosis     SURGICAL HISTORY: Past Surgical History:  Procedure Laterality Date   BREAST EXCISIONAL BIOPSY Right    years ago   DILATION AND CURETTAGE OF UTERUS     NECK SURGERY      SOCIAL HISTORY: Social History   Socioeconomic History   Marital status: Married    Spouse name: Not on file   Number of children: 2   Years of education: Not on file   Highest education level: Some college, no degree  Occupational History   Not on file  Tobacco Use   Smoking status: Every Day    Packs/day: 2.00    Years: 43.00    Total pack years: 86.00    Types: Cigarettes   Smokeless tobacco: Never  Vaping Use   Vaping Use: Never used  Substance and Sexual Activity   Alcohol use: Not Currently    Comment: seldom maybe 2-3 times a year   Drug use: No   Sexual activity: Not Currently  Other Topics Concern   Not on file  Social History Narrative   Not on file   Social Determinants of Health   Financial Resource Strain: Not on file  Food Insecurity: Not on file  Transportation Needs: Not on file  Physical Activity: Not on file  Stress: Not on file  Social Connections: Not on file  Intimate Partner Violence: Not on file    FAMILY HISTORY: Family History  Problem Relation Age of Onset   Anxiety disorder Brother    Breast cancer Maternal Aunt 40   Colon cancer Maternal Aunt    Bone cancer Paternal Aunt     ALLERGIES:  is allergic to prochlorperazine maleate and ibuprofen.  MEDICATIONS:  Current Outpatient Medications  Medication Sig Dispense Refill   Albuterol Sulfate (PROAIR RESPICLICK) 416 (90 Base) MCG/ACT AEPB Inhale 2 puffs into the lungs every 6 (six) hours as needed. 2 each 3   amLODipine (NORVASC) 2.5 MG tablet Take 2.5 mg by  mouth daily.     budesonide-formoterol (SYMBICORT) 160-4.5 MCG/ACT inhaler INHALE 2 INHALATIONS INTO THE LUNGS 2 TIMES DAILY     citalopram (CELEXA) 20 MG tablet Take 1 tablet (20 mg total) by mouth daily. 30 tablet 3   cyanocobalamin 1000 MCG tablet Take 1 tablet (1,000 mcg total) by mouth daily. 30 tablet 3   lamoTRIgine (LAMICTAL) 25 MG tablet Take 2 tablets (50 mg total) by mouth 2 (two) times daily at 8 am and 4 pm. 60 tablet 3   omeprazole (PRILOSEC) 20 MG capsule Take 20 mg by mouth daily.     QUEtiapine (SEROQUEL) 50 MG tablet Take 3 tablets (150 mg total) by mouth at bedtime. 90 tablet 3   LORazepam (ATIVAN) 0.5 MG tablet Take 1 tablet (0.5 mg total) by mouth every 4 (four) hours as needed for anxiety. (Patient not taking: Reported on 09/11/2022) 30 tablet 0   No current facility-administered medications for this visit.     PHYSICAL EXAMINATION: ECOG PERFORMANCE STATUS: 1 - Symptomatic but completely ambulatory Vitals:   09/11/22 1344  BP: (!) 140/59  Pulse: 65  Resp: 18  Temp: 98.7 F (37.1 C)   Filed Weights   09/11/22 1344  Weight: 116 lb 1.6 oz (52.7 kg)    Physical Exam Constitutional:      General: She is not in acute distress.    Comments: Thin built.   HENT:     Head: Normocephalic and atraumatic.  Eyes:     General: No scleral icterus.    Pupils: Pupils are equal, round, and reactive to light.  Cardiovascular:     Rate and Rhythm: Normal rate and regular rhythm.     Heart sounds: Normal heart sounds.  Pulmonary:     Effort: Pulmonary effort is normal. No respiratory distress.     Breath sounds: No wheezing.  Abdominal:     General: Bowel sounds are normal. There is no distension.     Palpations: Abdomen is soft. There is no mass.     Tenderness: There is no abdominal tenderness.  Musculoskeletal:        General: No deformity. Normal range of motion.     Cervical back: Normal range of motion and neck supple.  Skin:    General: Skin is warm and dry.      Findings: No erythema or rash.  Neurological:     Mental Status: She is alert and oriented to person, place, and time. Mental status is at baseline.     Cranial Nerves: No cranial nerve deficit.     Coordination: Coordination normal.  Psychiatric:        Mood and Affect: Mood normal.    RADIOGRAPHIC STUDIES: I have personally reviewed the radiological images as listed and agreed with the findings in the report. CT Head Wo Contrast  Result Date: 08/07/2022 CLINICAL DATA:  Mental status change, unknown cause EXAM: CT HEAD WITHOUT CONTRAST TECHNIQUE: Contiguous axial images were obtained  from the base of the skull through the vertex without intravenous contrast. RADIATION DOSE REDUCTION: This exam was performed according to the departmental dose-optimization program which includes automated exposure control, adjustment of the mA and/or kV according to patient size and/or use of iterative reconstruction technique. COMPARISON:  MRI 07/25/2022. FINDINGS: Brain: No evidence of acute infarction, hemorrhage, hydrocephalus, extra-axial collection or mass lesion/mass effect. Mild cerebellar tonsillar ectopia, better characterized on prior MRI. Vascular: No hyperdense vessel identified. Skull: No acute fracture. Sinuses/Orbits: Clear sinuses.  No acute orbital findings. Other: No mastoid effusions. IMPRESSION: No evidence of acute intracranial abnormality. Electronically Signed   By: Margaretha Sheffield M.D.   On: 08/07/2022 09:12   MR Brain W and Wo Contrast  Result Date: 07/25/2022 CLINICAL DATA:  Mental status changes.  Confusion EXAM: MRI HEAD WITHOUT AND WITH CONTRAST TECHNIQUE: Multiplanar, multiecho pulse sequences of the brain and surrounding structures were obtained without and with intravenous contrast. CONTRAST:  11m GADAVIST GADOBUTROL 1 MMOL/ML IV SOLN COMPARISON:  MR head without contrast and MR head with contrast 07/16/2022 FINDINGS: Brain: No acute infarct, hemorrhage, or mass lesion is  present. Central pontine T2 hyperintensities stable. Minimal periventricular and subcortical T2 hyperintensities are stable bilaterally. Basal ganglia are within normal limits. The internal auditory canals are within normal limits. Brainstem and cerebellum are otherwise within normal limits. Postcontrast images demonstrate no pathologic enhancement. Vascular: Flow is present in the major intracranial arteries. Skull and upper cervical spine: The craniocervical junction is normal. Upper cervical spine is within normal limits. Marrow signal is unremarkable. Sinuses/Orbits: The paranasal sinuses and mastoid air cells are clear. The globes and orbits are within normal limits. Other: IMPRESSION: 1. No acute intracranial abnormality or significant interval change. 2. Central pontine T2 hyperintensities are stable. This likely reflects the sequelae of chronic ischemia. Electronically Signed   By: CSan MorelleM.D.   On: 07/25/2022 14:22   CT HEAD WO CONTRAST (5MM)  Result Date: 07/25/2022 CLINICAL DATA:  Mental status change, unknown cause EXAM: CT HEAD WITHOUT CONTRAST TECHNIQUE: Contiguous axial images were obtained from the base of the skull through the vertex without intravenous contrast. RADIATION DOSE REDUCTION: This exam was performed according to the departmental dose-optimization program which includes automated exposure control, adjustment of the mA and/or kV according to patient size and/or use of iterative reconstruction technique. COMPARISON:  07/19/2022 FINDINGS: Brain: No evidence of acute infarction, hemorrhage, hydrocephalus, extra-axial collection or mass lesion/mass effect. Vascular: Atherosclerotic calcifications involving the large vessels of the skull base. No unexpected hyperdense vessel. Skull: Normal. Negative for fracture or focal lesion. Sinuses/Orbits: No acute finding. Other: None. IMPRESSION: No acute intracranial abnormality. Electronically Signed   By: NDavina PokeD.O.   On:  07/25/2022 11:50   CT CHEST ABDOMEN PELVIS W CONTRAST  Result Date: 07/19/2022 CLINICAL DATA:  Altered mental status for the last 3 weeks since having a UTI. EXAM: CT CHEST, ABDOMEN, AND PELVIS WITH CONTRAST TECHNIQUE: Multidetector CT imaging of the chest, abdomen and pelvis was performed following the standard protocol during bolus administration of intravenous contrast. RADIATION DOSE REDUCTION: This exam was performed according to the departmental dose-optimization program which includes automated exposure control, adjustment of the mA and/or kV according to patient size and/or use of iterative reconstruction technique. CONTRAST:  833mOMNIPAQUE IOHEXOL 350 MG/ML SOLN COMPARISON:  CT chest, lung cancer screening, 04/07/2022. CT abdomen and pelvis, 02/05/2010. FINDINGS: CT CHEST FINDINGS Cardiovascular: Heart normal in size. No pericardial effusion. Mild left coronary artery calcifications. Great vessels are normal  in caliber. Mild aortic atherosclerosis. Aortic arch branch vessels are widely patent. Mediastinum/Nodes: No enlarged mediastinal, hilar, or axillary lymph nodes. Thyroid gland, trachea, and esophagus demonstrate no significant findings. Lungs/Pleura: Several small stable nodules from the previous lung cancer screening CT. No new or suspicious nodules. Stable areas of scarring, most prominent in the anterior right upper and middle lobes adjacent to the minor fissure. 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 Stable centrilobular emphysema. No lung consolidation. No evidence of pulmonary edema. No pleural effusion or pneumothorax. Musculoskeletal: No acute fracture or acute finding. No bone lesion. No chest wall mass. CT ABDOMEN PELVIS FINDINGS Hepatobiliary: No focal liver abnormality is seen. No gallstones, gallbladder wall thickening, or biliary dilatation. Pancreas: Unremarkable. No pancreatic ductal dilatation or surrounding inflammatory  changes. Spleen: Normal in size without focal abnormality. Adrenals/Urinary Tract: No adrenal masses. Kidneys normal in size with symmetric enhancement and excretion. 6 mm low-attenuation mass from the upper pole the right kidney consistent with a cyst. No other renal masses, no stones and no hydronephrosis. Ureters are normal in course and in caliber. Normal bladder. Stomach/Bowel: Normal stomach. Small bowel and colon are normal in caliber. No wall thickening. No inflammation. No evidence of appendicitis. Vascular/Lymphatic: Aortic atherosclerosis. No dissection. No enlarged lymph nodes. Reproductive: Uterus and bilateral adnexa are unremarkable. Other: No abdominal wall hernia or abnormality. No abdominopelvic ascites. Musculoskeletal: No fracture or acute finding. No osteoblastic or osteolytic lesions. IMPRESSION: 1. No acute findings within the chest, abdomen or pelvis. 2. No evidence of malignancy. 3. Several small lung nodules without change from the prior CT chest, lung cancer screening exam. Please refer to that study for recommendations. Electronically Signed   By: Lajean Manes M.D.   On: 07/19/2022 11:01   CT ANGIO HEAD W OR WO CONTRAST  Result Date: 07/19/2022 CLINICAL DATA:  CNS vasculitis suspected EXAM: CT ANGIOGRAPHY HEAD TECHNIQUE: Multidetector CT imaging of the head was performed using the standard protocol during bolus administration of intravenous contrast. Multiplanar CT image reconstructions and MIPs were obtained to evaluate the vascular anatomy. RADIATION DOSE REDUCTION: This exam was performed according to the departmental dose-optimization program which includes automated exposure control, adjustment of the mA and/or kV according to patient size and/or use of iterative reconstruction technique. CONTRAST:  38m OMNIPAQUE IOHEXOL 350 MG/ML SOLN COMPARISON:  Brain MRI 07/16/2022 FINDINGS: CT HEAD Brain: No evidence of acute infarction, hemorrhage, hydrocephalus, extra-axial collection or  mass lesion/mass effect. 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 Brain volume is normal. Pons was highlighted on prior brain MRIs and is unremarkable by CT. Vascular: No hyperdense vessel or unexpected calcification. Skull: Normal. Negative for fracture or focal lesion. Sinuses: No acute or significant finding. CTA HEAD Anterior circulation: Mild atheromatous calcification on the carotid siphons. Posterior outpouching from the supraclinoid right ICA measuring 2 mm, with small emanating vessel suggested on source images. No branch occlusion or flow limiting stenosis. Hypoplastic left A1 segment. Posterior circulation: The vertebral and basilar arteries are smoothly contoured and widely patent. No branch occlusion, beading, or aneurysm. Hypoplastic left P1 segment. Venous sinuses: Unremarkable for the arterial phase Anatomic variants: Negative Review of the MIP images confirms the above findings. IMPRESSION: 1. Stable from CTA 3 days ago. No emergent finding or detected vasculitis. 2. 2 mm right supraclinoid ICA outpouching again favored to reflect infundibulum rather than aneurysm. Electronically Signed   By: JJorje GuildM.D.   On: 07/19/2022 10:57   DG FL GUIDED LUMBAR  PUNCTURE  Result Date: 07/18/2022 CLINICAL DATA:  Patient presented to the ED with altered mental status and there is concern for possible meningitis. Radiology request for diagnostic lumbar puncture. EXAM: DIAGNOSTIC LUMBAR PUNCTURE UNDER FLUOROSCOPIC GUIDANCE COMPARISON:  None Available. FLUOROSCOPY: Radiation Exposure Index (as provided by the fluoroscopic device): 13.8 mGy Kerma PROCEDURE: Informed consent was obtained from the patient's husband prior to the procedure, including potential complications of headache, allergy, and pain. With the patient prone, the lower back was prepped with Betadine. 1% Lidocaine was used for local anesthesia. Lumbar puncture was performed at the L3-L4 level using a 20 gauge spinal needle  with return of clear CSF with an opening pressure of 29 cm water. 15 ml of CSF were obtained for laboratory studies. Closing pressure of 15 cm water. The patient tolerated the procedure well and there were no apparent complications. IMPRESSION: Technically successful L3-L4 lumbar puncture yielding 15 mL of clear CSF for laboratory studies. Perfromed by Soyla Dryer, NP and interpreted by Kathreen Devoid, MD Electronically Signed   By: Kathreen Devoid M.D.   On: 07/18/2022 16:04   EEG adult  Result Date: 07/18/2022 Derek Jack, MD     07/21/2022  8:03 AM Routine EEG Report Junette Bernat Kemler is a 66 y.o. female with a history of encephalopathy who is undergoing an EEG to evaluate for seizures. Report: This EEG was acquired with electrodes placed according to the International 10-20 electrode system (including Fp1, Fp2, F3, F4, C3, C4, P3, P4, O1, O2, T3, T4, T5, T6, A1, A2, Fz, Cz, Pz). The following electrodes were missing or displaced: none. The occipital dominant rhythm was 12 Hz. This activity is reactive to stimulation. Drowsiness was manifested by background fragmentation; deeper stages of sleep were identified by K complexes and sleep spindles. There was no focal slowing. There were no interictal epileptiform discharges. There were no electrographic seizures identified. There was no abnormal response to photic stimulation or hyperventilation. Impression: This EEG was obtained while awake and asleep and is normal.   Clinical Correlation: Normal EEGs, however, do not rule out epilepsy. Su Monks, MD [facility] [phone] If 7pm- 7am, please page neurology on call as listed in [place].   MR BRAIN W CONTRAST  Result Date: 07/16/2022 CLINICAL DATA:  Pontine lesions EXAM: MRI HEAD WITH CONTRAST TECHNIQUE: Multiplanar, multiecho pulse sequences of the brain and surrounding structures were obtained with intravenous contrast. CONTRAST:  3m GADAVIST GADOBUTROL 1 MMOL/ML IV SOLN COMPARISON:   MRI brain without contrast 07/16/2022 FINDINGS: There is no abnormal contrast enhancement at the site of the pontine signal abnormality or anywhere else in the brain. IMPRESSION: No abnormal contrast enhancement within the pons. Electronically Signed   By: KUlyses JarredM.D.   On: 07/16/2022 22:54   MR BRAIN WO CONTRAST  Result Date: 07/16/2022 CLINICAL DATA:  Altered mental status stroke suspected EXAM: MRI HEAD WITHOUT CONTRAST TECHNIQUE: Multiplanar, multiecho pulse sequences of the brain and surrounding structures were obtained without intravenous contrast. COMPARISON:  No prior MRI, correlation is made with 07/16/2022 CTA head neck FINDINGS: Brain: No restricted diffusion to suggest acute or subacute infarct. No acute hemorrhage, mass, mass effect, or midline shift. No hemosiderin deposition to suggest remote hemorrhage. No hydrocephalus or extra-axial collection. Minimal T2 hyperintense signal in the periventricular white matter decreased T1 and increased T2 signal in the central pons (series 16 image 58 and series 15, image 16). Vascular: Normal arterial flow voids. Skull and upper cervical spine: Normal marrow signal. Sinuses/Orbits: No acute  finding. Other: Trace fluid in the right mastoid air cells. IMPRESSION: 1. Decreased T1 and increased T2 signal in the central pons without diffusion restriction, which is nonspecific and could represent the sequela of small vessel ischemic disease although few other T2 hyperintense foci are seen in the periventricular white matter. This could also be seen in the setting of central pontine myelinolysis. 2. No evidence of acute or subacute infarct. No additional acute intracranial process. Electronically Signed   By: Merilyn Baba M.D.   On: 07/16/2022 18:34   CT ANGIO HEAD NECK W WO CM  Result Date: 07/16/2022 CLINICAL DATA:  Stuttering, difficulty speaking EXAM: CT ANGIOGRAPHY HEAD AND NECK TECHNIQUE: Multidetector CT imaging of the head and neck was performed  using the standard protocol during bolus administration of intravenous contrast. Multiplanar CT image reconstructions and MIPs were obtained to evaluate the vascular anatomy. Carotid stenosis measurements (when applicable) are obtained utilizing NASCET criteria, using the distal internal carotid diameter as the denominator. RADIATION DOSE REDUCTION: This exam was performed according to the departmental dose-optimization program which includes automated exposure control, adjustment of the mA and/or kV according to patient size and/or use of iterative reconstruction technique. CONTRAST:  43m OMNIPAQUE IOHEXOL 350 MG/ML SOLN COMPARISON:  10/14/2010 CT head, no prior CTA FINDINGS: CT HEAD FINDINGS Brain: No evidence of acute infarct, hemorrhage, mass, mass effect, or midline shift. No hydrocephalus or extra-axial fluid collection. Vascular: No hyperdense vessel. Skull: Normal. Negative for fracture or focal lesion. Sinuses/Orbits: No acute finding. Other: The mastoid air cells are well aerated. CTA NECK FINDINGS Aortic arch: Standard branching. Imaged portion shows no evidence of aneurysm or dissection. No significant stenosis of the major arch vessel origins. Mild aortic atherosclerosis. Right carotid system: No evidence of dissection, occlusion, or hemodynamically significant stenosis (greater than 50%). Atherosclerotic disease at the bifurcation and in the proximal ICA is not hemodynamically significant. Left carotid system: No evidence of dissection, occlusion, or hemodynamically significant stenosis (greater than 50%). Vertebral arteries: Right dominant system. No evidence of dissection, occlusion, or hemodynamically significant stenosis (greater than 50%). Skeleton: No acute osseous abnormality. Degenerative changes in the temporomandibular joints. Other neck: No acute finding. Upper chest: Centrilobular and paraseptal emphysema. No focal pulmonary opacity or pleural effusion. Apical pleural-parenchymal scarring.  Review of the MIP images confirms the above findings CTA HEAD FINDINGS Anterior circulation: Both internal carotid arteries are patent to the termini, without significant stenosis. A small outpouching from the right ICA terminus (series 11, image 105) appears to be an infundibulum at the origin of the right posterior communicating artery, which is not otherwise visualized. A1 segments patent, although the left A1 appears hypoplastic. Normal anterior communicating artery. Anterior cerebral arteries are patent to their distal aspects. No M1 stenosis or occlusion.  MCA branches perfused and symmetric. Posterior circulation: Vertebral arteries patent to the vertebrobasilar junction without stenosis. Posterior inferior cerebellar arteries patent proximally. Basilar patent to its distal aspect. Superior cerebellar arteries patent proximally. Patent P1 segments. PCAs perfused to their distal aspects without stenosis. Left posterior communicating artery is patent. Venous sinuses: As permitted by contrast timing, patent. Anatomic variants: None significant. Review of the MIP images confirms the above findings IMPRESSION: 1.  No acute intracranial process. 2.  No intracranial large vessel occlusion or significant stenosis. 3.  No hemodynamically significant stenosis in the neck. 4. An outpouching from the right ICA terminus is favored to be an infundibulum at the origin of an otherwise not visualized right posterior communicating artery, which may be hypoplastic  or stenosed, rather than a tiny aneurysm. Electronically Signed   By: Merilyn Baba M.D.   On: 07/16/2022 17:26   DG Chest 2 View  Result Date: 07/16/2022 CLINICAL DATA:  Weakness. EXAM: CHEST - 2 VIEW COMPARISON:  December 31, 2018 FINDINGS: Calcific atherosclerotic disease and tortuosity of the aorta. Cardiomediastinal silhouette is normal. Mediastinal contours appear intact. There is no evidence of focal airspace consolidation, pleural effusion or pneumothorax.  Hyperinflation of the lungs. Osseous structures are without acute abnormality. Soft tissues are grossly normal. IMPRESSION: 1. No active cardiopulmonary disease. 2. Hyperinflation of the lungs. Electronically Signed   By: Fidela Salisbury M.D.   On: 07/16/2022 16:07   LABORATORY DATA:  I have reviewed the data as listed Lab Results  Component Value Date   WBC 8.8 09/11/2022   HGB 13.1 09/11/2022   HCT 40.4 09/11/2022   MCV 97.3 09/11/2022   PLT 341 09/11/2022   Recent Labs    07/16/22 1518 07/18/22 0841 07/25/22 1021 08/07/22 0728 09/11/22 1312  NA 140   < > 134* 139 137  K 3.7   < > 3.7 3.5 3.8  CL 108   < > 96* 107 106  CO2 26   < > '28 26 26  '$ GLUCOSE 111*   < > 99 105* 90  BUN 6*   < > 12 8 7*  CREATININE 0.49   < > 0.52 0.44 0.41*  CALCIUM 9.0   < > 9.7 9.2 8.7*  GFRNONAA >60   < > >60 >60 >60  PROT 6.8   < > 7.4 7.0 6.5  ALBUMIN 3.9   < > 4.4 4.0 3.8  AST 15   < > '15 17 28  '$ ALT 14   < > '14 12 15  '$ ALKPHOS 51   < > 42 49 55  BILITOT 0.4   < > 0.6 0.6 0.3  BILIDIR <0.1  --   --   --   --   IBILI NOT CALCULATED  --   --   --   --    < > = values in this interval not displayed.    Iron/TIBC/Ferritin/ %Sat No results found for: "IRON", "TIBC", "FERRITIN", "IRONPCTSAT"    JAK2 V617F mutation negative, with reflex to other mutations CALR, MPL, JAK 2 Ex 12-15 mutations negative.   ASSESSMENT & PLAN:  1. Thrombocytosis   2. Erythrocytosis   3. Personal history of tobacco use, presenting hazards to health    # Chronic thrombocytosis,normalized #Erythrocytosis, normalized.  Previously work up showed JAK2 V617F mutation negative, with reflex to other mutations CALR, MPL, JAK 2 Ex 12-15 mutations negative.negative [id number]-[id number], Elevated carbon monoxide level.  Likely due to smoking.  Currently her counts have normalized.  Every day smoker, smoke cessation was discussed.  No need to follow up routinely with hematology clinic. She will be discharged. She may reestablish  care if clinically indicated.    We spent sufficient time to discuss many aspect of care, questions were answered to patient's satisfaction. The patient knows to call the clinic with any problems questions or concerns.  Cc Tracie Harrier, MD  We spent sufficient time to discuss many aspect of care, questions were answered to patient's satisfaction.  Earlie Server, MD, PhD 09/11/2022

## 2022-09-17 IMAGING — CT CT CHEST LUNG CANCER SCREENING LOW DOSE W/O CM
2 of 5 series · 14 of 40 positions shown, 17 images · non-contrast
Comparison: Low-dose lung cancer screening chest CT 04/04/2021.

CLINICAL DATA: 65-year-old female current smoker with 81 pack-year
history of smoking. Lung cancer screening examination.



[Series 3: lung 1.00 · axial · 0.64mm/px · z∈[-1240,-899]mm · 11 of 377 slices shown, 14 images]
[im 18/377  mediastinal]
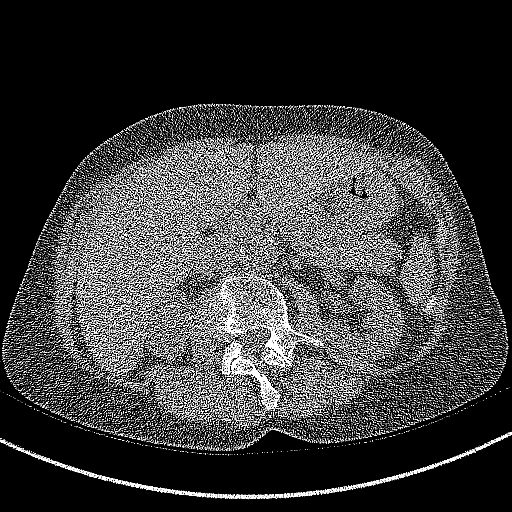
[im 18/377  lung]
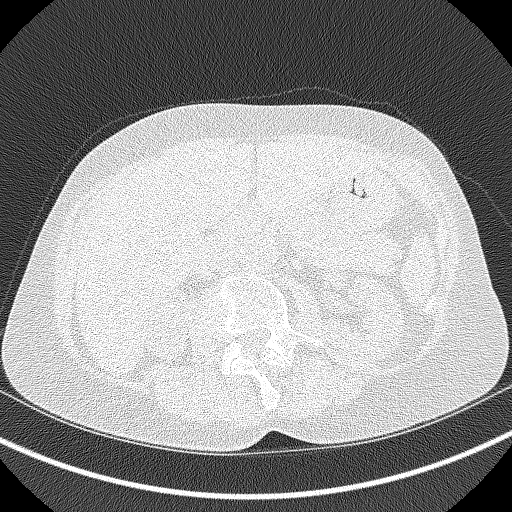
[im 52/377  lung]
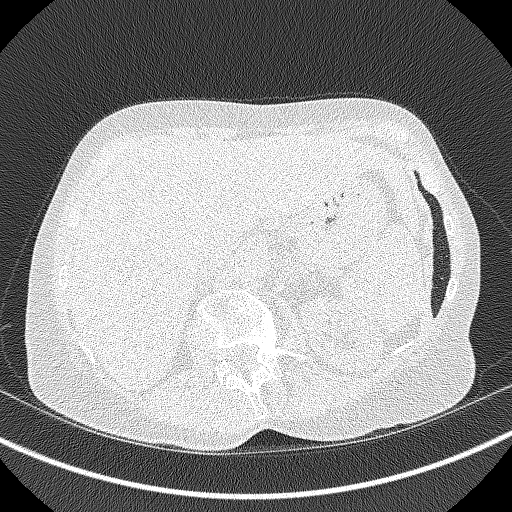
[im 86/377  lung]
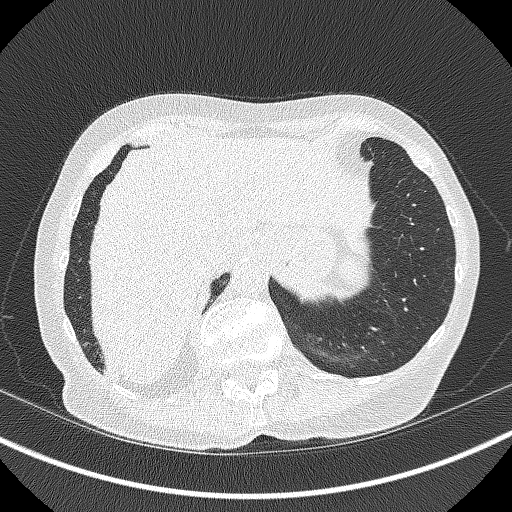
[im 120/377  lung]
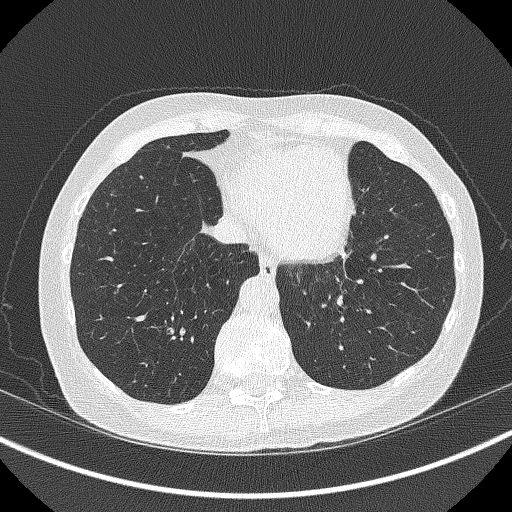
[im 154/377  mediastinal]
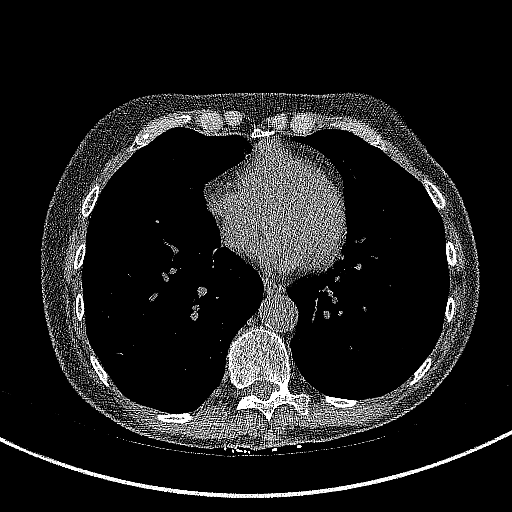
[im 154/377  lung]
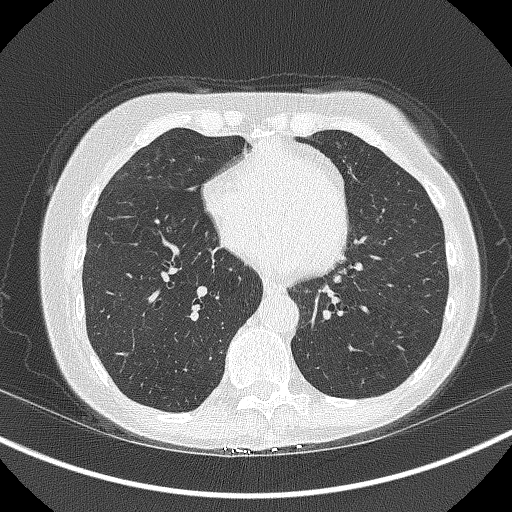
[im 189/377  lung]
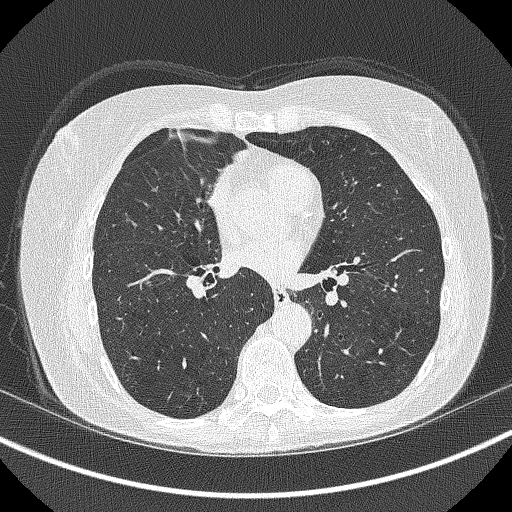
[im 223/377  lung]
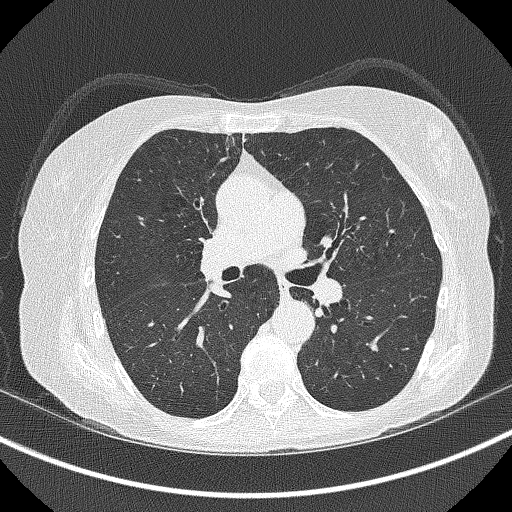
[im 257/377  lung]
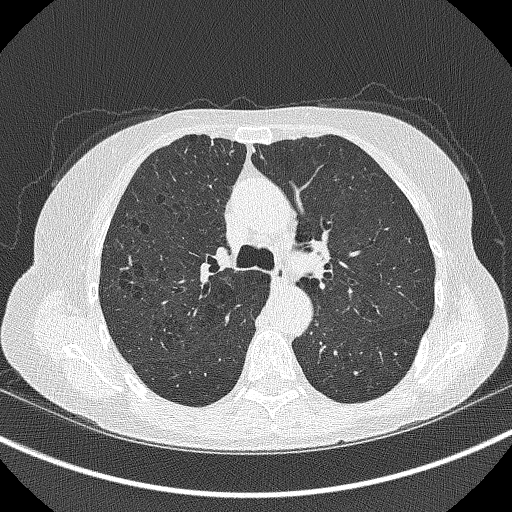
[im 291/377  mediastinal]
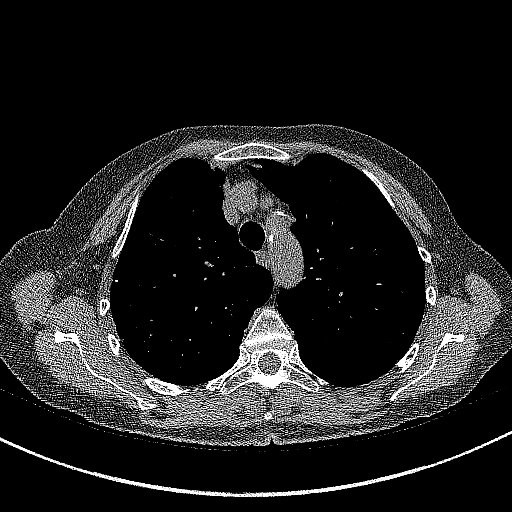
[im 291/377  lung]
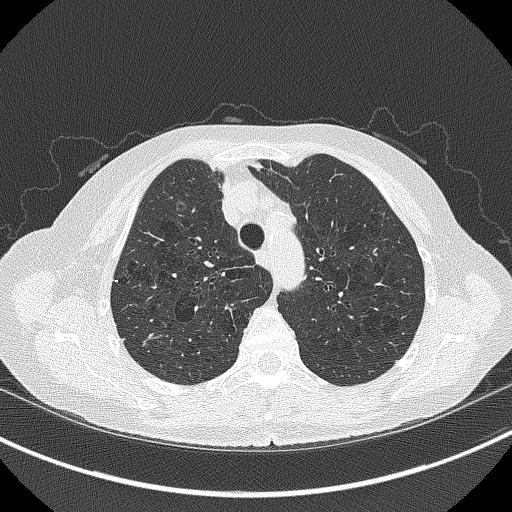
[im 325/377  lung]
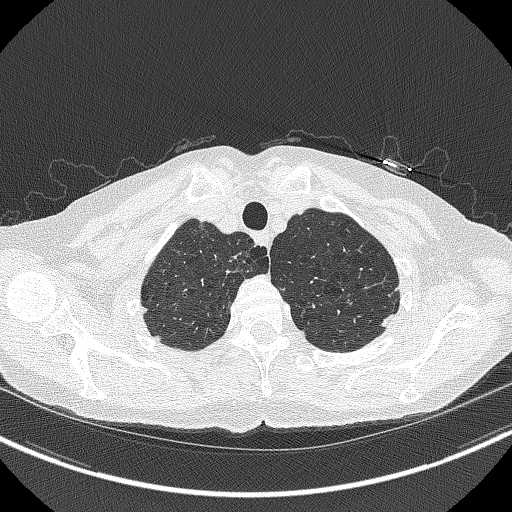
[im 359/377  lung]
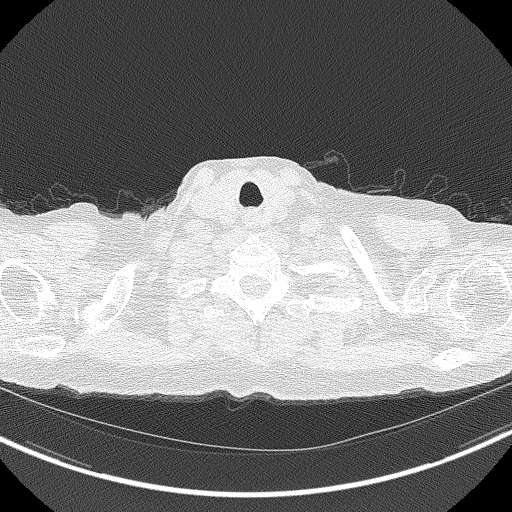

[Series 5: coronals lung 1.00 cor · coronal · 0.64mm/px · 3 of 286 slices shown]
[im 58/286  lung]
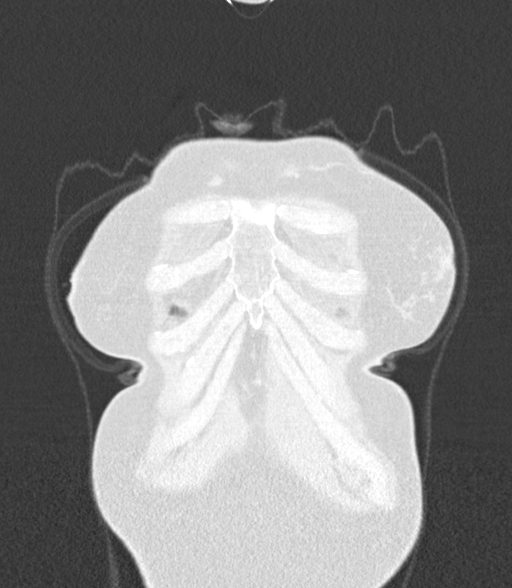
[im 115/286  lung]
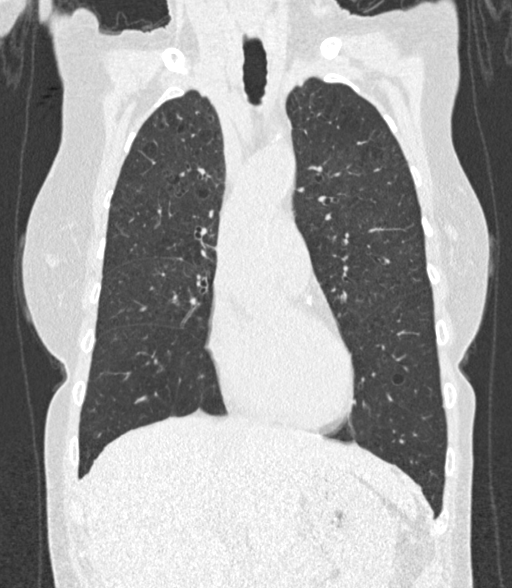
[im 172/286  lung]
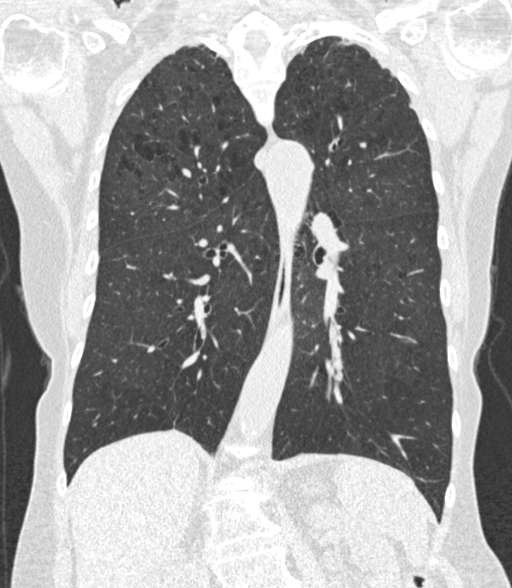

[14 of 40 positions shown; findings below may reference images not displayed]

FINDINGS: Cardiovascular: Heart size is normal. There is no significant
pericardial fluid, thickening or pericardial calcification. There is
aortic atherosclerosis, as well as atherosclerosis of the great
vessels of the mediastinum and the coronary arteries, including
calcified atherosclerotic plaque in the left anterior descending
coronary artery.

Mediastinum/Nodes: No pathologically enlarged mediastinal or hilar
lymph nodes. Please note that accurate exclusion of hilar adenopathy
is limited on noncontrast CT scans. Esophagus is unremarkable in
appearance. No axillary lymphadenopathy.

Lungs/Pleura: Multiple small pulmonary nodules are again noted
throughout the lungs bilaterally, similar in size and number to the
prior study, largest of which is in the posterior aspect of the
right lower lobe (axial image 280 of series 3), with a volume
derived mean diameter of 4.8 mm. No larger more suspicious appearing
pulmonary nodules or masses are noted. No acute consolidative
airspace disease. No pleural effusions. Diffuse bronchial wall
thickening with moderate centrilobular and paraseptal emphysema.
Scattered areas of chronic post infectious or inflammatory scarring
are again noted, most evident in the right middle lobe and inferior
segment of the lingula.

Upper Abdomen: Aortic atherosclerosis.

Musculoskeletal: There are no aggressive appearing lytic or blastic
lesions noted in the visualized portions of the skeleton.
IMPRESSION: 1. Lung-RADS 2S, benign appearance or behavior. Continue annual
screening with low-dose chest CT without contrast in 12 months.
2. The "S" modifier above refers to potentially clinically
significant non lung cancer related findings. Specifically, there is
aortic atherosclerosis, in addition to left anterior descending
coronary artery disease. Please note that although the presence of
coronary artery calcium documents the presence of coronary artery
disease, the severity of this disease and any potential stenosis
cannot be assessed on this non-gated CT examination. Assessment for
potential risk factor modification, dietary therapy or pharmacologic
therapy may be warranted, if clinically indicated.
3. Mild diffuse bronchial wall thickening with moderate
centrilobular and paraseptal emphysema; imaging findings suggestive
of underlying COPD.

Aortic Atherosclerosis (E8NEW-W1B.B) and Emphysema (E8NEW-A53.O).

## 2022-10-06 ENCOUNTER — Ambulatory Visit: Payer: Medicare HMO | Admitting: Psychiatry

## 2022-10-21 NOTE — Progress Notes (Signed)
BH MD/PA/NP OP Progress Note  10/23/2022 10:58 AM Yvonne Pitts  MRN:  [id number]  Chief Complaint:  Chief Complaint  Patient presents with   Follow-up   HPI:  This is a follow-up appointment for altered mental status and depression.  She states that she wants to talk "about your medication."  She then states that she has been taking 2 tablets, and has been working on "smoking (cessation)."  When she was asked if she had any side effect from the medication, she states that it was "too much" and she does not need medication for sleep. She wants to be "back on regular schedule."  She denies any drowsiness or fatigue, although she later states that she may feel a little drunk.  After she was explained that this medication was also due to her behaviors when she was in the hospital.  She then states that she does not think she needs this medication as she is able to sleep. When she was asked to reflect on the recent admission, although she recalls the recent admission ("I was so sick"), she does not feel that way anymore.  She is aware that she tends to forget things at times.  When she was working on clock drawing, she states that she has difficulty in this.  She states that it is "from smoke."  After having explained the rationality of continuing quetiapine at least at the current dose, she verbalized understanding to stay on this medication at this time unless she has any side effect. She denies SI/HI, hallucinations.  She denies alcohol use or drug use.    Yvonne Pitts her husband presents to the interview.  He states that although she is up doing things constantly, she forgets things done.  She start one thing, and work on another.  She is forgetful.  She used to read all the time, which he does not do anymore Yvonne Pitts states that she has difficulty in concentration, and reports that she thinks it is due to smoking).  She tends to be more bossy, and a little irritable Yvonne Pitts states that it is because of  "too much smoking.")  He denies any safety concern. He does not think she is back to herself.  He has seen only little improvement since August, although there is no worsening.    Mini cog- oriented to time, place except date ("23 th"), 2/3 (delayed recall), clock drawing- 2/3 (initially wrote line in stead of numbers. She then wrote numbers correctly after she was advised to place hands as 11:10. Placed long hands pointing to 11 and 3).   Family history-denies any history of dementia. Her father may have been diagnosed with parkinson disease.   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 Altered mental status, unspecified altered mental status type  R41.82     2. Major depressive disorder in full remission, unspecified whether recurrent (Yvonne Pitts)  F32.5       Past Psychiatric History: Please see initial evaluation for full details. I have reviewed the history. No updates at this time.     Past Medical History:  Past Medical History:  Diagnosis Date   COPD (chronic obstructive pulmonary disease) ([place]) 11/03/2016   DDD (degenerative disc disease), lumbar    Polycythemia    Ruptured  disc, cervical    Scoliosis     Past Surgical History:  Procedure Laterality Date   BREAST EXCISIONAL BIOPSY Right    years ago   DILATION AND CURETTAGE OF UTERUS     NECK SURGERY      Family Psychiatric History: Please see initial evaluation for full details. I have reviewed the history. No updates at this time.     Family History:  Family History  Problem Relation Age of Onset   Anxiety disorder Brother    Breast cancer Maternal Aunt 69   Colon cancer Maternal Aunt    Bone cancer Paternal Aunt     Social History:  Social History   Socioeconomic History   Marital  status: Married    Spouse name: Not on file   Number of children: 2   Years of education: Not on file   Highest education level: Some college, no degree  Occupational History   Not on file  Tobacco Use   Smoking status: Every Day    Packs/day: 2.00    Years: 43.00    Total pack years: 86.00    Types: Cigarettes   Smokeless tobacco: Never  Vaping Use   Vaping Use: Never used  Substance and Sexual Activity   Alcohol use: Not Currently    Comment: seldom maybe 2-3 times a year   Drug use: No   Sexual activity: Not Currently  Other Topics Concern   Not on file  Social History Narrative   Not on file   Social Determinants of Health   Financial Resource Strain: Not on file  Food Insecurity: Not on file  Transportation Needs: Not on file  Physical Activity: Not on file  Stress: Not on file  Social Connections: Not on file    Allergies:  Allergies  Allergen Reactions   Prochlorperazine Maleate     Other reaction(s): Unknown   Ibuprofen Rash    Metabolic Disorder Labs: Lab Results  Component Value Date   HGBA1C 5.4 08/09/2022   MPG 108.28 08/09/2022   No results found for: "PROLACTIN" Lab Results  Component Value Date   CHOL 182 08/09/2022   TRIG 106 08/09/2022   HDL 65 08/09/2022   CHOLHDL 2.8 08/09/2022   VLDL 21 08/09/2022   LDLCALC 96 08/09/2022   Lab Results  Component Value Date   TSH 3.714 07/25/2022   TSH 0.886 07/18/2022    Therapeutic Level Labs: No results found for: "LITHIUM" No results found for: "VALPROATE" No results found for: "CBMZ"  Current Medications: Current Outpatient Medications  Medication Sig Dispense Refill   Albuterol Sulfate (PROAIR RESPICLICK) 009 (90 Base) MCG/ACT AEPB Inhale 2 puffs into the lungs every 6 (six) hours as needed. 2 each 3   amLODipine (NORVASC) 2.5 MG tablet Take 2.5 mg by mouth daily.     budesonide-formoterol (SYMBICORT) 160-4.5 MCG/ACT inhaler INHALE 2 INHALATIONS INTO THE LUNGS 2 TIMES DAILY      citalopram (CELEXA) 20 MG tablet Take 1 tablet (20 mg total) by mouth daily. 30 tablet 3   cyanocobalamin 1000 MCG tablet Take 1 tablet (1,000 mcg total) by mouth daily. 30 tablet 3   lamoTRIgine (LAMICTAL) 25 MG tablet Take 2 tablets (50 mg total) by mouth 2 (two) times daily at 8 am and 4 pm. 60 tablet 3   LORazepam (ATIVAN) 0.5 MG tablet Take 1 tablet (0.5 mg total) by mouth every 4 (four) hours as needed for anxiety. 30 tablet 0   omeprazole (PRILOSEC) 20 MG capsule  Take 20 mg by mouth daily.     QUEtiapine (SEROQUEL) 50 MG tablet Take 3 tablets (150 mg total) by mouth at bedtime. 90 tablet 3   No current facility-administered medications for this visit.     Musculoskeletal: Strength & Muscle Tone: within normal limits Gait & Station: normal Patient leans: N/A  Psychiatric Specialty Exam: Review of Systems  Psychiatric/Behavioral:  Positive for decreased concentration. Negative for agitation, behavioral problems, confusion, dysphoric mood, hallucinations, self-injury, sleep disturbance and suicidal ideas. The patient is nervous/anxious. The patient is not hyperactive.   All other systems reviewed and are negative.   Blood pressure 129/69, pulse 69, temperature 97.9 F (36.6 C), temperature source Oral, height '5\' 4"'$  (1.626 m), weight 110 lb (49.9 kg).Body mass index is 18.88 kg/m.  General Appearance: Fairly Groomed  Eye Contact:  Good  Speech:  Clear and Coherent  Volume:  Normal  Mood:   good  Affect:  Appropriate, Congruent, and euthymic  Thought Process:  Coherent, mild perseveration on smoking  Orientation:  Full (Time, Place, and Person) except date  Thought Content: Logical   Suicidal Thoughts:  No  Homicidal Thoughts:  No  Memory:  Immediate;   Good  Judgement:  Good  Insight:  Present  Psychomotor Activity:  Normal, no rigidity, no tremors, no dyskinesia, no ballismus  Concentration:  Concentration: Good and Attention Span: Good  Recall:  Good  Fund of Knowledge:  Good  Language: Good  Akathisia:  No  Handed:  Right  AIMS (if indicated): not done  Assets:  Communication Skills Desire for Improvement  ADL's:  Intact  Cognition: WNL  Sleep:  Good   Screenings: AUDIT    Flowsheet Row Admission (Discharged) from 08/08/2022 in [facility]  Alcohol Use Disorder Identification Test Final Score (AUDIT) 0      GAD-7    Flowsheet Row Office Visit from 08/28/2022 in [facility]  Total GAD-7 Score 2      PHQ2-9    [place] Office Visit from 08/28/2022 in [facility]  PHQ-2 Total Score 0      [facility] Admission (Discharged) from 08/08/2022 in [facility] ED from 08/07/2022 in [facility] from 07/25/2022 in [facility] CATEGORY No Risk No Risk No Risk        Assessment and Plan:   DARNISHA VERNET is a 66 y.o. year old female with a history of depression, psychosis, COPD, ongoing tobacco use, DDD, polycythemia, who is originally referred for aftercare visit. - she was admitted to [facility] in 07/2022. Per chart review, "66 year old woman who has been displaying increasingly bizarre agitated behaviors with disorganized thoughts at home which has been occurring for several weeks.", who presents for follow up appointment for below.   1. Altered mental status, unspecified altered mental status type R/o cognitive impairment  Exam is notable for perseveration on smoking during the visit, although she is redirectable, calm and appropriate otherwise during the visit.  She does have some cognitive deficits evidence by mini cog. Recent subacute onset of altered mental status with behavior changes, which includes insomnia, disorganized thought process, along with word-finding difficulty may be secondary to neurocognitive disorder, although this presentation  is not typical.  She does have an upcoming appointment at memory clinic at [facility] in Feb. She self tapered quetiapine, although she denies any significant side effect.  Will continue current dose of quetiapine  at this time given recent behavior changes.  Noted that although lamotrigine could be tapered down, it has recently been up titrated by her neurologist, although its indication was unclear from the chart review.  Will defer this medication prescription to the neurologist.   2. Major depressive disorder in full remission, unspecified whether recurrent ([place]) She denies significant mood symptoms except self limited anxiety. She reports very good relationship with her family including her son with autism.  She reportedly has been stable mood at least for many years, being on citalopram.  Will continue current dose of citalopram to target depression and anxiety.     Plan Continue citalopram 20 mg daily Continue quetiapine 100 mg at night - she declined refill (self reduced from 150 mg for the past few weeks) Next appointment: 12/19 at 11:30 for 30 mins, in person - on  lamotrigine 50 mg twice a day (prescribed by neurologist)   The patient demonstrates the following risk factors for suicide: Chronic risk factors for suicide include: psychiatric disorder of depression . Acute risk factors for suicide include: N/A. Protective factors for this patient include: positive social support, responsibility to others (children, family), coping skills, and hope for the future. Considering these factors, the overall suicide risk at this point appears to be low. Patient is appropriate for outpatient follow up.         Collaboration of Care: Collaboration of Care: Other reviewed notes in Epic  Patient/Guardian was advised Release of Information must be obtained prior to any record release in order to collaborate their care with an outside provider. Patient/Guardian was advised if they have not already done so to  contact the registration department to sign all necessary forms in order for [place] to release information regarding their care.   Consent: Patient/Guardian gives verbal consent for treatment and assignment of benefits for services provided during this visit. Patient/Guardian expressed understanding and agreed to proceed.    Norman Clay, MD 10/23/2022, 10:58 AM

## 2022-10-23 ENCOUNTER — Ambulatory Visit: Payer: Medicare HMO | Admitting: Psychiatry

## 2022-10-23 ENCOUNTER — Encounter: Payer: Self-pay | Admitting: Psychiatry

## 2022-10-23 VITALS — BP 129/69 | HR 69 | Temp 97.9°F | Ht 64.0 in | Wt 110.0 lb

## 2022-10-23 DIAGNOSIS — F325 Major depressive disorder, single episode, in full remission: Secondary | ICD-10-CM

## 2022-10-23 DIAGNOSIS — R4182 Altered mental status, unspecified: Secondary | ICD-10-CM | POA: Diagnosis not present

## 2022-10-23 NOTE — Patient Instructions (Signed)
Continue citalopram 20 mg daily Continue quetiapine 100 mg at night  Continue lamotrigine 50 mg twice a day Next appointment: 12/19 at 11:30

## 2022-12-14 NOTE — Progress Notes (Signed)
BH MD/PA/NP OP Progress Note  12/16/2022 12:27 PM Yvonne Pitts  MRN:  [id number]  Chief Complaint:  Chief Complaint  Patient presents with   Follow-up   HPI:  This is a follow-up appointment for altered mental status and depression.  She states that she has been doing much better.  She discontinued quetiapine and citalopram before Thanksgiving (according to [place], she discontinued lamotrigine and quetiapine).  She thinks she is more clearheaded.  She talks about conflict with her husband.  She states that she was talking louder and louder to him.  She states that he does not have any patience, and is easily bothered by her.  She states that she feels stressed due to Christmas, and trying to get things ready.  She feels totally exhausted.  She states that she could not talk at times which "got to do with shadow..bright spots here (in the head.)"  She states that she was seen by neurologist, and it is coming from smoking.  She talks about not doing smoking and using patch together anymore.  She then states that she is extremely mad at her husband as he listens but not hear her.  She thinks he is against her. She thinks this way "due to the way treats me." She states that "did you see his face?, " referring to him making some facial expression against her comment. She states that she is under too much pressure due to him.  When she was asked by him what pressure it is, she answers "if you will listen, you will find out" She denies hallucinations. Unknown night time behavior.   Of note, The patient has mood symptoms as in PHQ-9/GAD-7. She wrote down "all due to stress + situations at home and it being christmas. I have pressure at home"  [name], her husband presents to the visit. [place] interrupts him many times during the visit despite redirection) He states that she is not sleeping well.  She keeps saying the same things over and over. She is "on my case" all day.  He states that he had hard  time coming here due to her resistance.   Minicog- oriented x4, delayed recall 3/3, clock drawing 3/3   Wt Readings from Last 3 Encounters:  12/16/22 102 lb (46.3 kg)  10/23/22 110 lb (49.9 kg)  09/11/22 116 lb 1.6 oz (52.7 kg)       Visit Diagnosis:    ICD-10-CM   1. Altered mental status, unspecified altered mental status type  R41.82     2. Major depressive disorder in full remission, unspecified whether recurrent (HCC)  F32.5       Past Psychiatric History: Please see initial evaluation for full details. I have reviewed the history. No updates at this time.     Past Medical History:  Past Medical History:  Diagnosis Date   COPD (chronic obstructive pulmonary disease) (HCC) 11/03/2016   DDD (degenerative disc disease), lumbar    Polycythemia    Ruptured disc, cervical    Scoliosis     Past Surgical History:  Procedure Laterality Date   BREAST EXCISIONAL BIOPSY Right    years ago   DILATION AND CURETTAGE OF UTERUS     NECK SURGERY      Family Psychiatric History: Please see initial evaluation for full details. I have reviewed the history. No updates at this time.     Family History:  Family History  Problem Relation Age of Onset   Anxiety disorder Brother  Breast cancer Maternal Aunt 40   Colon cancer Maternal Aunt    Bone cancer Paternal Aunt     Social History:  Social History   Socioeconomic History   Marital status: Married    Spouse name: Not on file   Number of children: 2   Years of education: Not on file   Highest education level: Some college, no degree  Occupational History   Not on file  Tobacco Use   Smoking status: Every Day    Packs/day: 2.00    Years: 43.00    Total pack years: 86.00    Types: Cigarettes   Smokeless tobacco: Never  Vaping Use   Vaping Use: Never used  Substance and Sexual Activity   Alcohol use: Not Currently    Comment: seldom maybe 2-3 times a year   Drug use: No   Sexual activity: Not Currently  Other  Topics Concern   Not on file  Social History Narrative   Not on file   Social Determinants of Health   Financial Resource Strain: Not on file  Food Insecurity: Not on file  Transportation Needs: Not on file  Physical Activity: Not on file  Stress: Not on file  Social Connections: Not on file    Allergies:  Allergies  Allergen Reactions   Prochlorperazine Maleate     Other reaction(s): Unknown   Ibuprofen Rash    Metabolic Disorder Labs: Lab Results  Component Value Date   HGBA1C 5.4 08/09/2022   MPG 108.28 08/09/2022   No results found for: "PROLACTIN" Lab Results  Component Value Date   CHOL 182 08/09/2022   TRIG 106 08/09/2022   HDL 65 08/09/2022   CHOLHDL 2.8 08/09/2022   VLDL 21 08/09/2022   LDLCALC 96 08/09/2022   Lab Results  Component Value Date   TSH 3.714 07/25/2022   TSH 0.886 07/18/2022    Therapeutic Level Labs: No results found for: "LITHIUM" No results found for: "VALPROATE" No results found for: "CBMZ"  Current Medications: Current Outpatient Medications  Medication Sig Dispense Refill   Albuterol Sulfate (PROAIR RESPICLICK) 108 (90 Base) MCG/ACT AEPB Inhale 2 puffs into the lungs every 6 (six) hours as needed. 2 each 3   amLODipine (NORVASC) 2.5 MG tablet Take 2.5 mg by mouth daily.     budesonide-formoterol (SYMBICORT) 160-4.5 MCG/ACT inhaler INHALE 2 INHALATIONS INTO THE LUNGS 2 TIMES DAILY     citalopram (CELEXA) 20 MG tablet Take 1 tablet (20 mg total) by mouth daily. 30 tablet 3   cyanocobalamin 1000 MCG tablet Take 1 tablet (1,000 mcg total) by mouth daily. 30 tablet 3   lamoTRIgine (LAMICTAL) 25 MG tablet Take 50 mg by mouth 2 (two) times daily.     omeprazole (PRILOSEC) 20 MG capsule Take 20 mg by mouth daily.     QUEtiapine (SEROQUEL) 50 MG tablet Take 3 tablets (150 mg total) by mouth at bedtime. 90 tablet 3   No current facility-administered medications for this visit.     Musculoskeletal: Strength & Muscle Tone: within  normal limits Gait & Station: normal Patient leans: N/A  Psychiatric Specialty Exam: Review of Systems  Psychiatric/Behavioral:  Positive for dysphoric mood and sleep disturbance. Negative for agitation, behavioral problems, confusion, decreased concentration, hallucinations, self-injury and suicidal ideas. The patient is nervous/anxious. The patient is not hyperactive.   All other systems reviewed and are negative.   Blood pressure 134/77, pulse 73, height 5\' 4"  (1.626 m), weight 102 lb (46.3 kg).Body mass index is 17.51  kg/m.  General Appearance: Fairly Groomed  Eye Contact:  Good  Speech:  Clear and Coherent, slightly pressured  Volume:  Normal  Mood:   good  Affect:  Appropriate, Congruent, and calm,   Thought Process:  Disorganized  Orientation:  Full (Time, Place, and Person)  Thought Content: Paranoid Ideation   Suicidal Thoughts:  No  Homicidal Thoughts:  No  Memory:  Immediate;   Good  Judgement:  Fair  Insight:  Present  Psychomotor Activity:  Normal, Normal tone, no rigidity, no resting/postural tremors, no tardive dyskinesia    Concentration:  Concentration: Good and Attention Span: Good  Recall:  Good  Fund of Knowledge: Good  Language: Good  Akathisia:  No  Handed:  Right  AIMS (if indicated): not done  Assets:  Communication Skills Desire for Improvement  ADL's:  Intact  Cognition: WNL  Sleep:  Poor   Screenings: AUDIT    Flowsheet Row Admission (Discharged) from 08/08/2022 in [facility]  Alcohol Use Disorder Identification Test Final Score (AUDIT) 0      GAD-7    Flowsheet Row Office Visit from 12/16/2022 in [facility] Office Visit from 10/23/2022 in [facility] Office Visit from 08/28/2022 in [facility]  Total GAD-7 Score 7 4 2       PHQ2-9    Flowsheet Row Office Visit from 12/16/2022 in [facility] Office  Visit from 10/23/2022 in [facility] Office Visit from 08/28/2022 in [facility]  PHQ-2 Total Score 2 0 0  PHQ-9 Total Score 7 -- --      Flowsheet Row Admission (Discharged) from 08/08/2022 in [facility] BE[facility] ED from 08/07/2022 in [facility] from 07[facility]25/2022 in [facility]  C-SSRS RISK CATEGORY No Risk No Risk No Risk        Assessment and Plan:  AMAL CLOWES is a 66 y.o. year old female with a history of depression, psychosis, COPD, ongoing tobacco use, DDD, polycythemia, who is originally referred for aftercare visit. - she was admitted to [facility] in 07/2022. Per chart review, "66 year old woman who has been displaying increasingly bizarre agitated behaviors with disorganized thoughts at home which has been occurring for several weeks.", who presents for follow up appointment for below.   1. Altered mental status, unspecified altered mental status type # Paranoia R/o cognitive disorder Significant worsening. Exam is notable slightly disorganized thought process, mildly pressured speech, paranoia, and perseveration on conflict with her husband, which were slightly difficult to redirect on today's interview.  Although she did have cognitive deficits at previous evaluation, she scored full on today's interview.  Recent subacute onset of altered mental status with behavior changes, which includes insomnia, disorganized thought process, along with word-finding difficulty may be secondary to neurocognitive disorder, although this presentation is not typical. I wonder if it is related to fluctuation of symptoms secondary to lew body disease, although she has not had formed hallucinations/parkinsonian symptoms. Front temporal dementia is in differential as well considering her slight change in personality.  She does have an upcoming appointment  at memory clinic at [facility] in Feb. May consider repeating MRI. Although she discontinued quetiapine, she is amenable to restart this medication to target behavior changes.  Discussed potential metabolic side effect, QTc elongation and drowsiness.   2. Major depressive disorder in full remission, unspecified whether recurrent (HCC) She denies significant mood symptoms since the last  visit.  Will continue current dose of citalopram to target depression and anxiety given her mood has been reportedly stable for many years while on this medication.   # weight loss She has weight loss. She was advised to contact her PCP for further evaluation along with mental status change.    Plan Continue citalopram 20 mg daily Restart quetiapine 100 mg at night  (EKG 409 msec 06/2022) Next appointment: 2/15 at 11 AM for 30 mins, in person - She self discontinued lamotrigine 50 mg twice a day (prescribed by neurologist). She was advised to contact the provider to update this.    The patient demonstrates the following risk factors for suicide: Chronic risk factors for suicide include: psychiatric disorder of depression . Acute risk factors for suicide include: N/A. Protective factors for this patient include: positive social support, responsibility to others (children, family), coping skills, and hope for the future. Considering these factors, the overall suicide risk at this point appears to be low. Patient is appropriate for outpatient follow up.      Collaboration of Care: Collaboration of Care: Other reviewed notes in Epic  Patient/Guardian was advised Release of Information must be obtained prior to any record release in order to collaborate their care with an outside provider. Patient/Guardian was advised if they have not already done so to contact the registration department to sign all necessary forms in order for [place] to release information regarding their care.   Consent: Patient/Guardian gives verbal consent  for treatment and assignment of benefits for services provided during this visit. Patient/Guardian expressed understanding and agreed to proceed.   The duration of the time spent on the following activities on the date of the encounter was 42 minutes.   Preparing to see the patient (e.g., review of test, records)  Obtaining and/or reviewing separately obtained history  Performing a medically necessary exam and/or evaluation  Counseling and educating the patient/family/caregiver  Ordering medications, tests, or procedures  Referring and communicating with other healthcare professionals (when not reported separately)  Documenting clinical information in the electronic or paper health record  Independently interpreting results of tests/labs and communication of results to the family or caregiver  Care coordination (when not reported separately)   Neysa Hotter, MD 12/16/2022, 12:27 PM

## 2022-12-16 ENCOUNTER — Ambulatory Visit: Payer: Medicare HMO | Admitting: Psychiatry

## 2022-12-16 ENCOUNTER — Encounter: Payer: Self-pay | Admitting: Psychiatry

## 2022-12-16 VITALS — BP 134/77 | HR 73 | Ht 64.0 in | Wt 102.0 lb

## 2022-12-16 DIAGNOSIS — R4182 Altered mental status, unspecified: Secondary | ICD-10-CM | POA: Diagnosis not present

## 2022-12-16 DIAGNOSIS — F325 Major depressive disorder, single episode, in full remission: Secondary | ICD-10-CM

## 2022-12-16 MED ORDER — QUETIAPINE FUMARATE 100 MG PO TABS
100.0000 mg | ORAL_TABLET | Freq: Every day | ORAL | 1 refills | Status: DC
Start: 2022-12-16 — End: 2023-01-09

## 2023-01-01 ENCOUNTER — Other Ambulatory Visit: Payer: Self-pay

## 2023-01-01 ENCOUNTER — Encounter: Payer: Self-pay | Admitting: Emergency Medicine

## 2023-01-01 ENCOUNTER — Emergency Department
Admission: EM | Admit: 2023-01-01 | Discharge: 2023-01-02 | Disposition: A | Discharge status: Other Acute Inpatient Hospital | Payer: Medicare HMO | Attending: Emergency Medicine | Admitting: Emergency Medicine

## 2023-01-01 DIAGNOSIS — F3112 Bipolar disorder, current episode manic without psychotic features, moderate: Secondary | ICD-10-CM | POA: Diagnosis not present

## 2023-01-01 DIAGNOSIS — F309 Manic episode, unspecified: Secondary | ICD-10-CM

## 2023-01-01 DIAGNOSIS — Z1152 Encounter for screening for COVID-19: Secondary | ICD-10-CM | POA: Insufficient documentation

## 2023-01-01 DIAGNOSIS — Z046 Encounter for general psychiatric examination, requested by authority: Secondary | ICD-10-CM | POA: Diagnosis present

## 2023-01-01 DIAGNOSIS — J449 Chronic obstructive pulmonary disease, unspecified: Secondary | ICD-10-CM | POA: Insufficient documentation

## 2023-01-01 DIAGNOSIS — Z79899 Other long term (current) drug therapy: Secondary | ICD-10-CM | POA: Diagnosis not present

## 2023-01-01 HISTORY — DX: Bipolar disorder, unspecified: F31.9

## 2023-01-01 LAB — COMPREHENSIVE METABOLIC PANEL
ALT: 11 U/L (ref 0–44)
AST: 15 U/L (ref 15–41)
Albumin: 3.2 g/dL — ABNORMAL LOW (ref 3.5–5.0)
Alkaline Phosphatase: 69 U/L (ref 38–126)
Anion gap: 10 (ref 5–15)
BUN: 6 mg/dL — ABNORMAL LOW (ref 8–23)
CO2: 25 mmol/L (ref 22–32)
Calcium: 8.7 mg/dL — ABNORMAL LOW (ref 8.9–10.3)
Chloride: 100 mmol/L (ref 98–111)
Creatinine, Ser: 0.42 mg/dL — ABNORMAL LOW (ref 0.44–1.00)
GFR, Estimated: >60 mL/min (ref >60)
Glucose, Bld: 110 mg/dL — ABNORMAL HIGH (ref 70–99)
Potassium: 3.5 mmol/L (ref 3.5–5.1)
Sodium: 135 mmol/L (ref 135–145)
Total Bilirubin: 0.7 mg/dL (ref 0.3–1.2)
Total Protein: 7.5 g/dL (ref 6.5–8.1)

## 2023-01-01 LAB — CBC
HCT: 38.3 % (ref 36.0–46.0)
Hemoglobin: 12.5 g/dL (ref 12.0–15.0)
MCH: 29.3 pg (ref 26.0–34.0)
MCHC: 32.6 g/dL (ref 30.0–36.0)
MCV: 89.9 fL (ref 80.0–100.0)
Platelets: 554 10*3/uL — ABNORMAL HIGH (ref 150–400)
RBC: 4.26 MIL/uL (ref 3.87–5.11)
RDW: 14.6 % (ref 11.5–15.5)
WBC: 16.1 10*3/uL — ABNORMAL HIGH (ref 4.0–10.5)
nRBC: 0 % (ref 0.0–0.2)

## 2023-01-01 LAB — URINALYSIS, ROUTINE W REFLEX MICROSCOPIC
Bilirubin Urine: NEGATIVE
Glucose, UA: NEGATIVE mg/dL
Hgb urine dipstick: NEGATIVE
Ketones, ur: 5 mg/dL — AB
Leukocytes,Ua: NEGATIVE
Nitrite: NEGATIVE
Protein, ur: NEGATIVE mg/dL
Specific Gravity, Urine: 1.008 (ref 1.005–1.030)
pH: 6 (ref 5.0–8.0)

## 2023-01-01 LAB — RESP PANEL BY RT-PCR (RSV, FLU A&B, COVID)  RVPGX2
Influenza A by PCR: NEGATIVE
Influenza B by PCR: NEGATIVE
Resp Syncytial Virus by PCR: NEGATIVE
SARS Coronavirus 2 by RT PCR: NEGATIVE

## 2023-01-01 LAB — URINE DRUG SCREEN, QUALITATIVE (ARMC ONLY)
Amphetamines, Ur Screen: NOT DETECTED
Barbiturates, Ur Screen: NOT DETECTED
Benzodiazepine, Ur Scrn: NOT DETECTED
Cannabinoid 50 Ng, Ur ~~LOC~~: NOT DETECTED
Cocaine Metabolite,Ur ~~LOC~~: NOT DETECTED
MDMA (Ecstasy)Ur Screen: NOT DETECTED
Methadone Scn, Ur: NOT DETECTED
Opiate, Ur Screen: NOT DETECTED
Phencyclidine (PCP) Ur S: NOT DETECTED
Tricyclic, Ur Screen: POSITIVE — AB

## 2023-01-01 LAB — SALICYLATE LEVEL: Salicylate Lvl: <7 mg/dL — ABNORMAL LOW (ref 7.0–30.0)

## 2023-01-01 LAB — ACETAMINOPHEN LEVEL: Acetaminophen (Tylenol), Serum: <10 ug/mL — ABNORMAL LOW (ref 10–30)

## 2023-01-01 LAB — ETHANOL: Alcohol, Ethyl (B): <10 mg/dL (ref <10)

## 2023-01-01 MED ORDER — OLANZAPINE 5 MG PO TABS
5.0000 mg | ORAL_TABLET | Freq: Two times a day (BID) | ORAL | Status: DC
  Administered 2023-01-01 – 2023-01-02 (×3): 5 mg via ORAL
  Filled 2023-01-01 (×3): qty 1

## 2023-01-01 MED ORDER — TRAZODONE HCL 100 MG PO TABS: 50.0000 mg | ORAL_TABLET | Freq: Every evening | ORAL | Status: DC | PRN

## 2023-01-01 MED ORDER — NICOTINE 14 MG/24HR TD PT24
14.0000 mg | MEDICATED_PATCH | Freq: Once | TRANSDERMAL | Status: DC
  Administered 2023-01-01: 14 mg via TRANSDERMAL
  Filled 2023-01-01: qty 1

## 2023-01-01 MED ORDER — VITAMIN B-12 1000 MCG PO TABS
1000.0000 ug | ORAL_TABLET | Freq: Every day | ORAL | Status: DC
  Administered 2023-01-01 – 2023-01-02 (×2): 1000 ug via ORAL
  Filled 2023-01-01 (×2): qty 1

## 2023-01-01 MED ORDER — CLONIDINE HCL 0.1 MG PO TABS
0.1000 mg | ORAL_TABLET | Freq: Every day | ORAL | Status: DC
  Administered 2023-01-01 – 2023-01-02 (×2): 0.1 mg via ORAL
  Filled 2023-01-01 (×2): qty 1

## 2023-01-01 MED ORDER — CITALOPRAM HYDROBROMIDE 20 MG PO TABS
20.0000 mg | ORAL_TABLET | Freq: Every day | ORAL | Status: DC
  Administered 2023-01-01 – 2023-01-02 (×2): 20 mg via ORAL
  Filled 2023-01-01 (×2): qty 1

## 2023-01-01 MED ORDER — ALBUTEROL SULFATE (2.5 MG/3ML) 0.083% IN NEBU: 3.0000 mL | INHALATION_SOLUTION | Freq: Four times a day (QID) | RESPIRATORY_TRACT | Status: DC | PRN

## 2023-01-01 NOTE — ED Notes (Signed)
IVC/Consult completed/ Recommend Inpt Admit

## 2023-01-01 NOTE — Consult Note (Signed)
[facility] Face-to-Face Psychiatry Consult   Reason for Consult:  mania Referring Physician:  EDP Patient Identification: Yvonne Pitts MRN:  [id number] Principal Diagnosis: bipolar affective disorder, mania, moderate Diagnosis:  bipolar affective disorder, mania, moderate   Total Time spent with patient: 45 minutes  Subjective:   Yvonne Pitts is a 67 y.o. female patient admitted with mania symptoms, lack of sleep.  HPI:  67 yo female presented with mania symptoms, "I've been up all day and night all day today."  She is talking fast and tangential at times.  "I got to get my daughter and Chief Executive Officer.  He just left and abandoned me (husband)."  She is anxious but denies depression.  She reports she takes medical medications, no psych meds mentioned.  Inpatient recommended for stabilization.  Past Psychiatric History: mania symptoms, anxiety  Risk to Self:  none Risk to Others:  none Prior Inpatient Therapy:  unknown Prior Outpatient Therapy:  Dr Modesta Messing  Past Medical History:  Past Medical History:  Diagnosis Date   Bipolar 1 disorder ([place])    COPD (chronic obstructive pulmonary disease) ([place]) 11/03/2016   DDD (degenerative disc disease), lumbar    Polycythemia    Ruptured disc, cervical    Scoliosis     Past Surgical History:  Procedure Laterality Date   BREAST EXCISIONAL BIOPSY Right    years ago   DILATION AND CURETTAGE OF UTERUS     NECK SURGERY     Family History:  Family History  Problem Relation Age of Onset   Anxiety disorder Brother    Breast cancer Maternal Aunt 40   Colon cancer Maternal Aunt    Bone cancer Paternal Aunt    Family Psychiatric  History: see above Social History:  Social History   Substance and Sexual Activity  Alcohol Use Not Currently   Comment: seldom maybe 2-3 times a year     Social History   Substance and Sexual Activity  Drug Use No    Social History   Socioeconomic History   Marital status: Married    Spouse name: Not on file    Number of children: 2   Years of education: Not on file   Highest education level: Some college, no degree  Occupational History   Not on file  Tobacco Use   Smoking status: Every Day    Packs/day: 2.00    Years: 43.00    Total pack years: 86.00    Types: Cigarettes   Smokeless tobacco: Never  Vaping Use   Vaping Use: Never used  Substance and Sexual Activity   Alcohol use: Not Currently    Comment: seldom maybe 2-3 times a year   Drug use: No   Sexual activity: Not Currently  Other Topics Concern   Not on file  Social History Narrative   Not on file   Social Determinants of Health   Financial Resource Strain: Not on file  Food Insecurity: Not on file  Transportation Needs: Not on file  Physical Activity: Not on file  Stress: Not on file  Social Connections: Not on file   Additional Social History:  none    Allergies:   Allergies  Allergen Reactions   Prochlorperazine Maleate     Other reaction(s): Unknown   Ibuprofen Rash    Labs:  Results for orders placed or performed during the hospital encounter of 01/01/23 (from the past 48 hour(s))  CBC     Status: Abnormal   Collection Time: 01/01/23  5:38 AM  Result Value Ref Range   WBC 16.1 (H) 4.0 - 10.5 K/uL   RBC 4.26 3.87 - 5.11 MIL/uL   Hemoglobin 12.5 12.0 - 15.0 g/dL   HCT 38.3 36.0 - 46.0 %   MCV 89.9 80.0 - 100.0 fL   MCH 29.3 26.0 - 34.0 pg   MCHC 32.6 30.0 - 36.0 g/dL   RDW 14.6 11.5 - 15.5 %   Platelets 554 (H) 150 - 400 K/uL   nRBC 0.0 0.0 - 0.2 %    Comment: Performed at [facility], [address] [place], [place] [address]  Ethanol     Status: None   Collection Time: 01/01/23  5:38 AM  Result Value Ref Range   Alcohol, Ethyl (B) <10 <10 mg/dL    Comment: (NOTE) Lowest detectable limit for serum alcohol is 10 mg/dL.  For medical purposes only. Performed at [facility], [address] [place], [place] [address]   Salicylate level     Status: Abnormal    Collection Time: 01/01/23  5:38 AM  Result Value Ref Range   Salicylate Lvl <1.7 (L) 7.0 - 30.0 mg/dL    Comment: Performed at [facility], [address] [place], [place] [address]  Acetaminophen level     Status: Abnormal   Collection Time: 01/01/23  5:38 AM  Result Value Ref Range   Acetaminophen (Tylenol), Serum <10 (L) 10 - 30 ug/mL    Comment: (NOTE) Therapeutic concentrations vary significantly. A range of 10-30 ug/mL  may be an effective concentration for many patients. However, some  are best treated at concentrations outside of this range. Acetaminophen concentrations >150 ug/mL at 4 hours after ingestion  and >50 ug/mL at 12 hours after ingestion are often associated with  toxic reactions.  Performed at [facility], [address] [place], [place] [address]   Comprehensive metabolic panel     Status: Abnormal   Collection Time: 01/01/23  5:38 AM  Result Value Ref Range   Sodium 135 135 - 145 mmol/L   Potassium 3.5 3.5 - 5.1 mmol/L   Chloride 100 98 - 111 mmol/L   CO2 25 22 - 32 mmol/L   Glucose, Bld 110 (H) 70 - 99 mg/dL    Comment: Glucose reference range applies only to samples taken after fasting for at least 8 hours.   BUN 6 (L) 8 - 23 mg/dL   Creatinine, Ser 0.42 (L) 0.44 - 1.00 mg/dL   Calcium 8.7 (L) 8.9 - 10.3 mg/dL   Total Protein 7.5 6.5 - 8.1 g/dL   Albumin 3.2 (L) 3.5 - 5.0 g/dL   AST 15 15 - 41 U/L   ALT 11 0 - 44 U/L   Alkaline Phosphatase 69 38 - 126 U/L   Total Bilirubin 0.7 0.3 - 1.2 mg/dL   GFR, Estimated >60 >60 mL/min    Comment: (NOTE) Calculated using the CKD-EPI Creatinine Equation (2021)    Anion gap 10 5 - 15    Comment: Performed at [facility], [address] [place], [place] [address]  Resp panel by RT-PCR (RSV, Flu A&B, Covid) Anterior Nasal Swab     Status: None   Collection Time: 01/01/23  5:39 AM   Specimen: Anterior Nasal Swab  Result Value Ref Range   SARS Coronavirus 2 by RT PCR NEGATIVE  NEGATIVE    Comment: (NOTE) SARS-CoV-2 target nucleic acids are NOT DETECTED.  The SARS-CoV-2 RNA is generally detectable in upper respiratory specimens during the  acute phase of infection. The lowest concentration of SARS-CoV-2 viral copies this assay can detect is 138 copies/mL. A negative result does not preclude SARS-Cov-2 infection and should not be used as the sole basis for treatment or other patient management decisions. A negative result may occur with  improper specimen collection/handling, submission of specimen other than nasopharyngeal swab, presence of viral mutation(s) within the areas targeted by this assay, and inadequate number of viral copies(<138 copies/mL). A negative result must be combined with clinical observations, patient history, and epidemiological information. The expected result is Negative.  Fact Sheet for Patients:  EntrepreneurPulse.com.au  Fact Sheet for Healthcare Providers:  IncredibleEmployment.be  This test is no t yet approved or cleared by the [place] FDA and  has been authorized for detection and/or diagnosis of SARS-CoV-2 by FDA under an Emergency Use Authorization (EUA). This EUA will remain  in effect (meaning this test can be used) for the duration of the COVID-19 declaration under Section 564(b)(1) of the Act, 21 U.S.C.section 360bbb-3(b)(1), unless the authorization is terminated  or revoked sooner.       Influenza A by PCR NEGATIVE NEGATIVE   Influenza B by PCR NEGATIVE NEGATIVE    Comment: (NOTE) The Xpert Xpress SARS-CoV-2/FLU/RSV plus assay is intended as an aid in the diagnosis of influenza from Nasopharyngeal swab specimens and should not be used as a sole basis for treatment. Nasal washings and aspirates are unacceptable for Xpert Xpress SARS-CoV-2/FLU/RSV testing.  Fact Sheet for Patients: EntrepreneurPulse.com.au  Fact Sheet for Healthcare  Providers: IncredibleEmployment.be  This test is not yet approved or cleared by the [place] FDA and has been authorized for detection and/or diagnosis of SARS-CoV-2 by FDA under an Emergency Use Authorization (EUA). This EUA will remain in effect (meaning this test can be used) for the duration of the COVID-19 declaration under Section 564(b)(1) of the Act, 21 U.S.C. section 360bbb-3(b)(1), unless the authorization is terminated or revoked.     Resp Syncytial Virus by PCR NEGATIVE NEGATIVE    Comment: (NOTE) Fact Sheet for Patients: EntrepreneurPulse.com.au  Fact Sheet for Healthcare Providers: IncredibleEmployment.be  This test is not yet approved or cleared by the [place] FDA and has been authorized for detection and/or diagnosis of SARS-CoV-2 by FDA under an Emergency Use Authorization (EUA). This EUA will remain in effect (meaning this test can be used) for the duration of the COVID-19 declaration under Section 564(b)(1) of the Act, 21 U.S.C. section 360bbb-3(b)(1), unless the authorization is terminated or revoked.  Performed at [facility], [address] [place], [place] [address]   Urine Drug Screen, Qualitative [facility] only)     Status: Abnormal   Collection Time: 01/01/23  7:55 AM  Result Value Ref Range   Tricyclic, Ur Screen POSITIVE (A) NONE DETECTED   Amphetamines, Ur Screen NONE DETECTED NONE DETECTED   MDMA (Ecstasy)Ur Screen NONE DETECTED NONE DETECTED   Cocaine Metabolite,Ur [place] NONE DETECTED NONE DETECTED   Opiate, Ur Screen NONE DETECTED NONE DETECTED   Phencyclidine (PCP) Ur S NONE DETECTED NONE DETECTED   Cannabinoid 50 Ng, Ur [place] NONE DETECTED NONE DETECTED   Barbiturates, Ur Screen NONE DETECTED NONE DETECTED   Benzodiazepine, Ur Scrn NONE DETECTED NONE DETECTED   Methadone Scn, Ur NONE DETECTED NONE DETECTED    Comment: (NOTE) Tricyclics + metabolites, urine    Cutoff 1000  ng/mL Amphetamines + metabolites, urine  Cutoff 1000 ng/mL MDMA (Ecstasy), urine  Cutoff 500 ng/mL Cocaine Metabolite, urine          Cutoff 300 ng/mL Opiate + metabolites, urine        Cutoff 300 ng/mL Phencyclidine (PCP), urine         Cutoff 25 ng/mL Cannabinoid, urine                 Cutoff 50 ng/mL Barbiturates + metabolites, urine  Cutoff 200 ng/mL Benzodiazepine, urine              Cutoff 200 ng/mL Methadone, urine                   Cutoff 300 ng/mL  The urine drug screen provides only a preliminary, unconfirmed analytical test result and should not be used for non-medical purposes. Clinical consideration and professional judgment should be applied to any positive drug screen result due to possible interfering substances. A more specific alternate chemical method must be used in order to obtain a confirmed analytical result. Gas chromatography / mass spectrometry (GC/MS) is the preferred confirm atory method. Performed at [facility], [address] [place], [place] [address]   Urinalysis, Routine w reflex microscopic     Status: Abnormal   Collection Time: 01/01/23  7:55 AM  Result Value Ref Range   Color, Urine YELLOW (A) YELLOW   APPearance CLEAR (A) CLEAR   Specific Gravity, Urine 1.008 1.005 - 1.030   pH 6.0 5.0 - 8.0   Glucose, UA NEGATIVE NEGATIVE mg/dL   Hgb urine dipstick NEGATIVE NEGATIVE   Bilirubin Urine NEGATIVE NEGATIVE   Ketones, ur 5 (A) NEGATIVE mg/dL   Protein, ur NEGATIVE NEGATIVE mg/dL   Nitrite NEGATIVE NEGATIVE   Leukocytes,Ua NEGATIVE NEGATIVE    Comment: Performed at [facility], [address] [place], [place] [address]    Current Facility-Administered Medications  Medication Dose Route Frequency Provider Last Rate Last Admin   albuterol (PROVENTIL) (2.5 MG/3ML) 0.083% nebulizer solution 3 mL  3 mL Inhalation Q6H PRN Vanessa Corder, MD       nicotine (NICODERM CQ - dosed in mg/24 hours) patch 14 mg  14 mg  Transdermal Once Vanessa [place], MD       Current Outpatient Medications  Medication Sig Dispense Refill   Albuterol Sulfate (PROAIR RESPICLICK) 570 (90 Base) MCG/ACT AEPB Inhale 2 puffs into the lungs every 6 (six) hours as needed. 2 each 3   amLODipine (NORVASC) 2.5 MG tablet Take 2.5 mg by mouth daily.     budesonide-formoterol (SYMBICORT) 160-4.5 MCG/ACT inhaler INHALE 2 INHALATIONS INTO THE LUNGS 2 TIMES DAILY     citalopram (CELEXA) 20 MG tablet Take 1 tablet (20 mg total) by mouth daily. 30 tablet 3   cyanocobalamin 1000 MCG tablet Take 1 tablet (1,000 mcg total) by mouth daily. 30 tablet 3   lamoTRIgine (LAMICTAL) 25 MG tablet Take 50 mg by mouth 2 (two) times daily.     omeprazole (PRILOSEC) 20 MG capsule Take 20 mg by mouth daily.     QUEtiapine (SEROQUEL) 100 MG tablet Take 1 tablet (100 mg total) by mouth at bedtime. 30 tablet 1   QUEtiapine (SEROQUEL) 50 MG tablet Take 3 tablets (150 mg total) by mouth at bedtime. 90 tablet 3    Musculoskeletal: Strength & Muscle Tone: within normal limits Gait & Station: normal Patient leans: N/A  Psychiatric Specialty Exam: Physical Exam Vitals and nursing note reviewed.  Constitutional:      Appearance: Normal appearance.  HENT:     Head: Normocephalic.     Nose: Nose normal.  Pulmonary:     Effort: Pulmonary effort is normal.  Musculoskeletal:        General: Normal range of motion.     Cervical back: Normal range of motion.  Neurological:     General: No focal deficit present.     Mental Status: She is alert and oriented to person, place, and time.  Psychiatric:        Attention and Perception: Attention normal.        Mood and Affect: Mood is anxious. Affect is blunt.        Speech: Speech normal.        Behavior: Behavior normal. Behavior is cooperative.        Thought Content: Thought content is delusional.        Cognition and Memory: Cognition is impaired.        Judgment: Judgment is impulsive and inappropriate.      Review of Systems  Psychiatric/Behavioral:  The patient is nervous/anxious.   All other systems reviewed and are negative.   Blood pressure (!) 130/115, pulse 91, temperature 98.5 F (36.9 C), temperature source Oral, resp. rate 18, height '5\' 4"'$  (1.626 m), weight 55 kg, SpO2 95 %.Body mass index is 20.81 kg/m.  General Appearance: Disheveled  Eye Contact:  Fair  Speech:  Normal Rate  Volume:  Normal  Mood:  Anxious  Affect:  Blunt  Thought Process:  Tangential at times  Orientation:  Full (Time, Place, and Person)  Thought Content:  Delusions  Suicidal Thoughts:  No  Homicidal Thoughts:  No  Memory:  Immediate;   Fair Recent;   Fair Remote;   Fair  Judgement:  Impaired  Insight:  Lacking  Psychomotor Activity:  Normal  Concentration:  Concentration: Fair and Attention Span: Fair  Recall:  [employer] of Knowledge:  Fair  Language:  Good  Akathisia:  No  Handed:  Right  AIMS (if indicated):     Assets:  Housing Leisure Time Physical Health Resilience Social Support  ADL's:  Intact  Cognition:  Impaired,  Mild  Sleep:        Physical Exam: Physical Exam Vitals and nursing note reviewed.  Constitutional:      Appearance: Normal appearance.  HENT:     Head: Normocephalic.     Nose: Nose normal.  Pulmonary:     Effort: Pulmonary effort is normal.  Musculoskeletal:        General: Normal range of motion.     Cervical back: Normal range of motion.  Neurological:     General: No focal deficit present.     Mental Status: She is alert and oriented to person, place, and time.  Psychiatric:        Attention and Perception: Attention normal.        Mood and Affect: Mood is anxious. Affect is blunt.        Speech: Speech normal.        Behavior: Behavior normal. Behavior is cooperative.        Thought Content: Thought content is delusional.        Cognition and Memory: Cognition is impaired.        Judgment: Judgment is impulsive and inappropriate.    Review of  Systems  Psychiatric/Behavioral:  The patient is nervous/anxious.   All other systems reviewed and are negative.  Blood pressure (!) 130/115, pulse 91, temperature 98.5 F (  36.9 C), temperature source Oral, resp. rate 18, height '5\' 4"'$  (1.626 m), weight 55 kg, SpO2 95 %. Body mass index is 20.81 kg/m.  Treatment Plan Summary: Daily contact with patient to assess and evaluate symptoms and progress in treatment, Medication management, and Plan : Bipolar affective disorder, mania, moderate: Zyprexa 5 mg BID  Insomnia: Trazodone 50 mg at bedtime PRN  Disposition: Recommend psychiatric Inpatient admission when medically cleared.  Waylan Boga, NP 01/01/2023 1:32 PM

## 2023-01-01 NOTE — ED Notes (Signed)
Behavioral med NP at the bedside for pt evaluation

## 2023-01-01 NOTE — ED Triage Notes (Signed)
Pt to triage via w/c with no distress noted, in custody of [place] [employer] for IVC; pt denies SI or HI; papers indicate pt with hx bipolar and is currently manic; stopped meds few mos ago

## 2023-01-01 NOTE — ED Notes (Addendum)
Pt pacing in day room and frequently to nurses station loudly demanding staff provide her with an attorney. Pt states she needs to speaking with the psychiatrist immediately and informs staff they are to get the doctor right now. Pt reassured she will be evaluated when an MD is available and encouraged to rest in her room. Pt refuses at this time.

## 2023-01-01 NOTE — ED Notes (Signed)
Pt had spilt water in room and got it on pants, shirt, and socks. Clean clothes provided and water cleaned

## 2023-01-01 NOTE — ED Notes (Signed)
This tech obtained vital signs on pt.

## 2023-01-01 NOTE — BH Assessment (Signed)
Patient is under review with [facility]. Awaiting accepting details.

## 2023-01-01 NOTE — ED Notes (Signed)
Dinner tray provided for pt

## 2023-01-01 NOTE — Consult Note (Signed)
Psychiatry: Reviewed patient with TTS counselor.  Reviewed chart.  67 year old woman with a known history of manic like psychotic symptoms returns to the hospital under IVC with symptoms consistent with ongoing mania.  Vital signs stable.  Labs stable and not prohibitive to psychiatric treatment.  Recommend admission to the geriatric psychiatry unit for further treatment.

## 2023-01-01 NOTE — ED Notes (Signed)
Lunch and beverage provided

## 2023-01-01 NOTE — ED Provider Notes (Signed)
[facility] Provider Note    Event Date/Time   First MD Initiated Contact with Patient 01/01/23 [phone]     (approximate)   History   Mental Health Problem   HPI  Yvonne Pitts is a 67 y.o. female with bipolar who comes in with concerns for mania.  Patient also has a history of COPD.  Patient reports being off of his medications for his psychiatric for the past few months.  I reviewed his note from 12/16/2022.  Patient comes in under IVC denies any SI or HI.  Patient reports that she was promised to have a cigarette when she is cleared.  Patient is upset that she has been here for a long time has not been cleared yet.  Tried to explain to patient the process of IVC but patient kept interrupting me.  Offered patient nicotine patch but patient declined.  Patient unable to be redirected.  She denies any fevers cough or other concerns.  Patient was IVC by her husband due to not taking her medications and concern for mania.  Physical Exam   Triage Vital Signs: ED Triage Vitals  Enc Vitals Group     BP 01/01/23 0532 (!) 130/115     Pulse Rate 01/01/23 0532 91     Resp 01/01/23 0532 18     Temp 01/01/23 0532 98.5 F (36.9 C)     Temp Source 01/01/23 0532 Oral     SpO2 01/01/23 0532 95 %     Weight 01/01/23 0534 121 lb 4.1 oz (55 kg)     Height 01/01/23 0534 '5\' 4"'$  (1.626 m)     Head Circumference --      Peak Flow --      Pain Score 01/01/23 0532 0     Pain Loc --      Pain Edu? --      Excl. in [place]? --     Most recent vital signs: Vitals:   01/01/23 0532  BP: (!) 130/115  Pulse: 91  Resp: 18  Temp: 98.5 F (36.9 C)  SpO2: 95%     General: Awake, no distress.  CV:  Good peripheral perfusion.  Resp:  Normal effort.  Abd:  No distention.  Other:  Patient is able to ambulate with normal gait.  Moving all extremities well.  No obvious cranial deficit.  Patient is manic with pressured speech.   ED Results / Procedures / Treatments   Labs (all  labs ordered are listed, but only abnormal results are displayed) Labs Reviewed  CBC - Abnormal; Notable for the following components:      Result Value   WBC 16.1 (*)    Platelets 554 (*)    All other components within normal limits  SALICYLATE LEVEL - Abnormal; Notable for the following components:   Salicylate Lvl <9.7 (*)    All other components within normal limits  ACETAMINOPHEN LEVEL - Abnormal; Notable for the following components:   Acetaminophen (Tylenol), Serum <10 (*)    All other components within normal limits  COMPREHENSIVE METABOLIC PANEL - Abnormal; Notable for the following components:   Glucose, Bld 110 (*)    BUN 6 (*)    Creatinine, Ser 0.42 (*)    Calcium 8.7 (*)    Albumin 3.2 (*)    All other components within normal limits  RESP PANEL BY RT-PCR (RSV, FLU A&B, COVID)  [id number]  ETHANOL  URINE DRUG SCREEN, QUALITATIVE ([facility] ONLY)  URINALYSIS, ROUTINE W  REFLEX MICROSCOPIC       PROCEDURES:  Critical Care performed: No  Procedures   MEDICATIONS ORDERED IN ED: Medications - No data to display   IMPRESSION / MDM / [place] / ED COURSE  I reviewed the triage vital signs and the nursing notes.   Patient's presentation is most consistent with acute presentation with potential threat to life or bodily function.   Pt is without any acute medical complaints. No exam findings to suggest medical cause of current presentation. Will order psychiatric screening labs and discuss further w/ psychiatric service.  D/d includes but is not limited to psychiatric disease, behavioral/personality disorder, inadequate socioeconomic support, medical.  Based on HPI, exam, unremarkable labs, no concern for acute medical problem at this time. No rigidity, clonus, hyperthermia, focal neurologic deficit, diaphoresis, tachycardia, meningismus, ataxia, gait abnormality or other finding to suggest this visit represents a non-psychiatric problem. Screening labs  reviewed.    Given this, pt medically cleared, to be dispositioned per Psych.    The patient has been placed in psychiatric observation due to the need to provide a safe environment for the patient while obtaining psychiatric consultation and evaluation, as well as ongoing medical and medication management to treat the patient's condition.  The patient has been placed under full IVC at this time.    COVID, flu are negative.  White count slightly elevated will add on urine to make sure no UTI but no other infectious symptoms.  Tylenol salicylate is negative.  LFTs are normal.      FINAL CLINICAL IMPRESSION(S) / ED DIAGNOSES   Final diagnoses:  Mania ([place])     Rx / DC Orders   ED Discharge Orders     None        Note:  This document was prepared using Dragon voice recognition software and may include unintentional dictation errors.   Vanessa Savage, MD 01/01/23 [phone]

## 2023-01-01 NOTE — BH Assessment (Signed)
Comprehensive Clinical Assessment (CCA) Note  01/01/2023 Yvonne Pitts [id number]  Fredda Hammed, 67 year old female who presents to [facility] ED involuntarily for treatment. Per triage note, Pt to triage via w/c with no distress noted, in custody of [place] PD for IVC; pt denies SI or HI; papers indicate pt with hx bipolar and is currently manic; stopped meds few mos. ago   During TTS assessment pt presents alert and oriented x 4, restless but cooperative, and mood-congruent with affect. The pt does not appear to be responding to internal or external stimuli. Neither is the pt presenting with any delusional thinking. Pt verified the information provided to triage RN.   Pt identifies her main complaint to be that she is upset with her husband because he will not talk to her or show her any attention. Patient states she wants a restraining order taken out on her husband and wants to speak to a lawyer so she can start preparing for divorce. When asked if her husband was violent or abusive, she adamantly states no. Patient presents with tangential thought and speech. Patient is tearful at times but immediately jumps to another subject. Patient denies using any illicit substances and alcohol. Patient reports she lives with her husband and 2 year old autistic son. Patient states she also has a daughter that she needs to see. Patient reports she takes the meds that her husband gives her but she believes they are poisoned. Pt denies current SI/HI/AH/VH.    Per York Cerise, NP, pt is recommended for inpatient psychiatric admission.  Chief Complaint:  Chief Complaint  Patient presents with   Mental Health Problem   Visit Diagnosis: Bipolar affective disorder, mania, moderate    CCA Screening, Triage and Referral (STR)  Patient Reported Information How did you hear about [place]? Family/Friend  Referral name: No data recorded Referral phone number: No data recorded  Whom do you see for routine medical  problems? No data recorded Practice/Facility Name: No data recorded Practice/Facility Phone Number: No data recorded Name of Contact: No data recorded Contact Number: No data recorded Contact Fax Number: No data recorded Prescriber Name: No data recorded Prescriber Address (if known): No data recorded  What Is the Reason for Your Visit/Call Today? Patient reports she is not sure why she was brought to the ED.  How Long Has This Been Causing You Problems? 1-6 months  What Do You Feel Would Help You the Most Today? Medication(s); Treatment for Depression or other mood problem   Have You Recently Been in Any Inpatient Treatment (Hospital/Detox/Crisis Center/28-Day Program)? No data recorded Name/Location of Program/Hospital:No data recorded How Long Were You There? No data recorded When Were You Discharged? No data recorded  Have You Ever Received Services From [facility] Before? No data recorded Who Do You See at [facility]? No data recorded  Have You Recently Had Any Thoughts About Hurting Yourself? No  Are You Planning to Commit Suicide/Harm Yourself At This time? No   Have you Recently Had Thoughts About [place]? No  Explanation: No data recorded  Have You Used Any Alcohol or Drugs in the Past 24 Hours? No  How Long Ago Did You Use Drugs or Alcohol? No data recorded What Did You Use and How Much? No data recorded  Do You Currently Have a Therapist/Psychiatrist? No  Name of Therapist/Psychiatrist: No data recorded  Have You Been Recently Discharged From Any Office Practice or Programs? No  Explanation of Discharge From Practice/Program: No data recorded  CCA Screening Triage Referral Assessment Type of Contact: Face-to-Face  Is this Initial or Reassessment? No data recorded Date Telepsych consult ordered in [facility]:  No data recorded Time Telepsych consult ordered in [facility]:  No data recorded  Patient Reported Information Reviewed? No data  recorded Patient Left Without Being Seen? No data recorded Reason for Not Completing Assessment: No data recorded  Collateral Involvement: None provided   Does Patient Have a [place]? No data recorded Name and Contact of Legal Guardian: No data recorded If Minor and Not Living with Parent(s), Who has Custody? No data recorded Is CPS involved or ever been involved? Never  Is APS involved or ever been involved? Never   Patient Determined To Be At Risk for Harm To Self or Others Based on Review of Patient Reported Information or Presenting Complaint? No  Method: No data recorded Availability of Means: No data recorded Intent: No data recorded Notification Required: No data recorded Additional Information for Danger to Others Potential: No data recorded Additional Comments for Danger to Others Potential: No data recorded Are There Guns or Other Weapons in Your Home? No data recorded Types of Guns/Weapons: No data recorded Are These Weapons Safely Secured?                            No data recorded Who Could Verify You Are Able To Have These Secured: No data recorded Do You Have any Outstanding Charges, Pending Court Dates, Parole/Probation? No data recorded Contacted To Inform of Risk of Harm To Self or Others: No data recorded  Location of Assessment: [facility]   Does Patient Present under Involuntary Commitment? Yes  IVC Papers Initial File Date: 08/07/22   [place] of Residence: [place]   Patient Currently Receiving the Following Services: Medication Management   Determination of Need: Emergent (2 hours)   Options For Referral: ED Visit; Inpatient Hospitalization; Medication Management     CCA Biopsychosocial Intake/Chief Complaint:  No data recorded Current Symptoms/Problems: No data recorded  Patient Reported Schizophrenia/Schizoaffective Diagnosis in Past: No   Strengths: Have stable housing, she's polite, and have a support  system.  Preferences: No data recorded Abilities: No data recorded  Type of Services Patient Feels are Needed: No data recorded  Initial Clinical Notes/Concerns: No data recorded  Mental Health Symptoms Depression:   Difficulty Concentrating   Duration of Depressive symptoms:  Greater than two weeks   Mania:   Change in energy/activity; Racing thoughts   Anxiety:    N/A   Psychosis:   None   Duration of Psychotic symptoms: No data recorded  Trauma:   N/A   Obsessions:   N/A   Compulsions:   N/A   Inattention:   N/A   Hyperactivity/Impulsivity:   N/A   Oppositional/Defiant Behaviors:   N/A   Emotional Irregularity:   N/A   Other Mood/Personality Symptoms:  No data recorded   Mental Status Exam Appearance and self-care  Stature:   Average   Weight:   Thin   Clothing:   Neat/clean; Age-appropriate   Grooming:   Normal   Cosmetic use:   None   Posture/gait:   Normal   Motor activity:   Not Remarkable   Sensorium  Attention:   Distractible; Confused   Concentration:   Focuses on irrelevancies; Preoccupied; Scattered   Orientation:   Person; Place; Object; Time   Recall/memory:   Defective in Immediate; Defective in Recent   Affect  and Mood  Affect:   Anxious; Labile   Mood:   Anxious   Relating  Eye contact:   Fleeting   Facial expression:   Anxious; Responsive   Attitude toward examiner:   Cooperative; Suspicious   Thought and Language  Speech flow:  Flight of Ideas   Thought content:   Ideas of Reference   Preoccupation:   Other (Comment) (Patient speaks of wanting to separate from her husband.)   Hallucinations:   None   Organization:  No data recorded  [employer] of Knowledge:   Fair   Intelligence:   Average   Abstraction:   Overly abstract   Judgement:   Impaired   Reality Testing:   Distorted   Insight:   Flashes of insight   Decision Making:   Confused   Social  Functioning  Social Maturity:   Isolates   Social Judgement:   Impropriety   Stress  Stressors:   Relationship; Family conflict   Coping Ability:   Programme researcher, broadcasting/film/video Deficits:   Interpersonal   Supports:   Family; Friends/Service system     Religion:    Leisure/Recreation:    Exercise/Diet: Exercise/Diet Do You Have Any Trouble Sleeping?: No   CCA Employment/Education Employment/Work Situation: Employment / Work Nurse, children's Situation: Retired Social research officer, government has Been Impacted by Current Illness: No Has Patient ever Been in Passenger transport manager?: No  Education:     CCA Family/Childhood History Family and Relationship History: Family history Marital status: Married Does patient have children?: Yes How many children?: 2  Childhood History:     Child/Adolescent Assessment:     CCA Substance Use Alcohol/Drug Use: Alcohol / Drug Use Pain Medications: See PTA History of alcohol / drug use?: No history of alcohol / drug abuse Longest period of sobriety (when/how long): n/a                         ASAM's:  Six Dimensions of Multidimensional Assessment  Dimension 1:  Acute Intoxication and/or Withdrawal Potential:      Dimension 2:  Biomedical Conditions and Complications:      Dimension 3:  Emotional, Behavioral, or Cognitive Conditions and Complications:     Dimension 4:  Readiness to Change:     Dimension 5:  Relapse, Continued use, or Continued Problem Potential:     Dimension 6:  Recovery/Living Environment:     ASAM Severity Score:    ASAM Recommended Level of Treatment:     Substance use Disorder (SUD)    Recommendations for Services/Supports/Treatments:    DSM5 Diagnoses: Patient Active Problem List   Diagnosis Date Noted   Bipolar affective disorder, currently manic, moderate ([place]) 01/01/2023   Leukocytosis 07/22/2022   Tobacco abuse 07/19/2022   GERD (gastroesophageal reflux disease) 07/18/2022   Essential  hypertension 07/18/2022   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

## 2023-01-01 NOTE — ED Notes (Signed)
Pt given snack and beverage

## 2023-01-01 NOTE — ED Notes (Addendum)
Pt to nurses station and appears more calm. Quietly speaking to staff to inform this nurse that she needs a nicotine patch and her inhaler.  MD aware.

## 2023-01-01 NOTE — ED Notes (Signed)
Pt resting in room quietly lying on stretcher. Appears calm at this time.

## 2023-01-01 NOTE — ED Notes (Addendum)
With this nurse and EDT Latonya present, pt removes red bathrobe, brown slippers & socks, pink gown, gold tone band, gold tone band with clear stone, silver tone band with clear stone, white bra and panties, 2 small lighters and pack of cigarettes--all placed in labeled pt belonging bag to be secured on nursing unit & pt changed into behav scrubs

## 2023-01-02 ENCOUNTER — Encounter: Payer: Self-pay | Admitting: Psychiatry

## 2023-01-02 ENCOUNTER — Other Ambulatory Visit: Payer: Self-pay

## 2023-01-02 ENCOUNTER — Inpatient Hospital Stay
Admission: AD | Admit: 2023-01-02 | Discharge: 2023-01-09 | DRG: 885 | Disposition: A | Discharge status: Home / Self Care | Payer: Medicare HMO | Source: Intra-hospital | Attending: Psychiatry | Admitting: Psychiatry

## 2023-01-02 DIAGNOSIS — J449 Chronic obstructive pulmonary disease, unspecified: Secondary | ICD-10-CM | POA: Diagnosis present

## 2023-01-02 DIAGNOSIS — D72829 Elevated white blood cell count, unspecified: Secondary | ICD-10-CM | POA: Diagnosis present

## 2023-01-02 DIAGNOSIS — Z79899 Other long term (current) drug therapy: Secondary | ICD-10-CM | POA: Diagnosis not present

## 2023-01-02 DIAGNOSIS — F17210 Nicotine dependence, cigarettes, uncomplicated: Secondary | ICD-10-CM | POA: Diagnosis present

## 2023-01-02 DIAGNOSIS — G4700 Insomnia, unspecified: Secondary | ICD-10-CM | POA: Diagnosis present

## 2023-01-02 DIAGNOSIS — F01518 Vascular dementia, unspecified severity, with other behavioral disturbance: Secondary | ICD-10-CM | POA: Diagnosis present

## 2023-01-02 DIAGNOSIS — F3112 Bipolar disorder, current episode manic without psychotic features, moderate: Principal | ICD-10-CM

## 2023-01-02 DIAGNOSIS — I10 Essential (primary) hypertension: Secondary | ICD-10-CM | POA: Diagnosis present

## 2023-01-02 DIAGNOSIS — Z720 Tobacco use: Secondary | ICD-10-CM | POA: Diagnosis present

## 2023-01-02 DIAGNOSIS — Z20822 Contact with and (suspected) exposure to covid-19: Secondary | ICD-10-CM | POA: Diagnosis present

## 2023-01-02 DIAGNOSIS — J9801 Acute bronchospasm: Secondary | ICD-10-CM | POA: Diagnosis present

## 2023-01-02 DIAGNOSIS — M5136 Other intervertebral disc degeneration, lumbar region: Secondary | ICD-10-CM | POA: Diagnosis present

## 2023-01-02 LAB — URINE CULTURE

## 2023-01-02 MED ORDER — LORAZEPAM 1 MG PO TABS
1.0000 mg | ORAL_TABLET | ORAL | Status: DC | PRN
  Administered 2023-01-05 – 2023-01-07 (×2): 1 mg via ORAL
  Filled 2023-01-02 (×2): qty 1

## 2023-01-02 MED ORDER — QUETIAPINE FUMARATE 100 MG PO TABS
100.0000 mg | ORAL_TABLET | Freq: Every day | ORAL | Status: DC
  Administered 2023-01-02 – 2023-01-05 (×4): 100 mg via ORAL
  Filled 2023-01-02 (×4): qty 1

## 2023-01-02 MED ORDER — CITALOPRAM HYDROBROMIDE 20 MG PO TABS: 20.0000 mg | ORAL_TABLET | Freq: Every day | ORAL | Status: DC

## 2023-01-02 MED ORDER — OLANZAPINE 5 MG PO TABS
5.0000 mg | ORAL_TABLET | Freq: Four times a day (QID) | ORAL | Status: DC | PRN
  Administered 2023-01-06: 5 mg via ORAL
  Filled 2023-01-02: qty 1

## 2023-01-02 MED ORDER — OLANZAPINE 5 MG PO TABS: 5.0000 mg | ORAL_TABLET | Freq: Two times a day (BID) | ORAL | Status: DC

## 2023-01-02 MED ORDER — TRAZODONE HCL 100 MG PO TABS: 100.0000 mg | ORAL_TABLET | Freq: Every evening | ORAL | Status: DC | PRN

## 2023-01-02 MED ORDER — ACETAMINOPHEN 325 MG PO TABS
650.0000 mg | ORAL_TABLET | Freq: Four times a day (QID) | ORAL | Status: DC | PRN
  Administered 2023-01-02 – 2023-01-09 (×9): 650 mg via ORAL
  Filled 2023-01-02 (×9): qty 2

## 2023-01-02 MED ORDER — CITALOPRAM HYDROBROMIDE 20 MG PO TABS
20.0000 mg | ORAL_TABLET | Freq: Every day | ORAL | Status: DC
  Administered 2023-01-03 – 2023-01-09 (×7): 20 mg via ORAL
  Filled 2023-01-02 (×7): qty 1

## 2023-01-02 MED ORDER — ACETAMINOPHEN 325 MG PO TABS
650.0000 mg | ORAL_TABLET | Freq: Four times a day (QID) | ORAL | Status: DC | PRN
  Administered 2023-01-02: 650 mg via ORAL
  Filled 2023-01-02: qty 2

## 2023-01-02 MED ORDER — ALBUTEROL SULFATE (2.5 MG/3ML) 0.083% IN NEBU: 3.0000 mL | INHALATION_SOLUTION | Freq: Four times a day (QID) | RESPIRATORY_TRACT | Status: DC | PRN

## 2023-01-02 MED ORDER — ACETAMINOPHEN 325 MG PO TABS: 650.0000 mg | ORAL_TABLET | Freq: Four times a day (QID) | ORAL | Status: DC | PRN

## 2023-01-02 MED ORDER — ALUM & MAG HYDROXIDE-SIMETH 200-200-20 MG/5ML PO SUSP
30.0000 mL | ORAL | Status: DC | PRN
  Administered 2023-01-03 – 2023-01-09 (×8): 30 mL via ORAL
  Filled 2023-01-02 (×7): qty 30

## 2023-01-02 MED ORDER — VITAMIN B-12 1000 MCG PO TABS
1000.0000 ug | ORAL_TABLET | Freq: Every day | ORAL | Status: DC
  Administered 2023-01-03 – 2023-01-09 (×7): 1000 ug via ORAL
  Filled 2023-01-02 (×7): qty 1

## 2023-01-02 MED ORDER — MEMANTINE HCL 10 MG PO TABS
5.0000 mg | ORAL_TABLET | Freq: Every day | ORAL | Status: DC
  Administered 2023-01-02 – 2023-01-08 (×7): 5 mg via ORAL
  Filled 2023-01-02 (×7): qty 1

## 2023-01-02 MED ORDER — DIVALPROEX SODIUM 250 MG PO DR TAB
250.0000 mg | DELAYED_RELEASE_TABLET | ORAL | Status: DC
  Administered 2023-01-02 – 2023-01-09 (×14): 250 mg via ORAL
  Filled 2023-01-02 (×13): qty 1

## 2023-01-02 MED ORDER — MAGNESIUM HYDROXIDE 400 MG/5ML PO SUSP: 30.0000 mL | Freq: Every day | ORAL | Status: DC | PRN

## 2023-01-02 MED ORDER — NICOTINE 14 MG/24HR TD PT24
14.0000 mg | MEDICATED_PATCH | Freq: Every day | TRANSDERMAL | Status: DC
  Administered 2023-01-02 – 2023-01-09 (×8): 14 mg via TRANSDERMAL
  Filled 2023-01-02 (×9): qty 1

## 2023-01-02 MED ORDER — TRAZODONE HCL 50 MG PO TABS: 50.0000 mg | ORAL_TABLET | Freq: Every evening | ORAL | Status: DC | PRN

## 2023-01-02 NOTE — Tx Team (Signed)
Initial Treatment Plan 01/02/2023 2:49 PM Yvonne Pitts [id number]    PATIENT STRESSORS: Marital or family conflict   Medication change or noncompliance     PATIENT STRENGTHS: Motivation for treatment/growth  Supportive family/friends    PATIENT IDENTIFIED PROBLEMS: Medication noncompliance  Family Conflict                   DISCHARGE CRITERIA:  Improved stabilization in mood, thinking, and/or behavior Motivation to continue treatment in a less acute level of care  PRELIMINARY DISCHARGE PLAN: Outpatient therapy Return to previous living arrangement  PATIENT/FAMILY INVOLVEMENT: This treatment plan has been presented to and reviewed with the patient, Yvonne Pitts, and/or family member.  The patient and family have been given the opportunity to ask questions and make suggestions.  Aleen Sells, RN 01/02/2023, 2:49 PM

## 2023-01-02 NOTE — ED Notes (Signed)
Pt to nurses station, requests pain  medication. Pt states pt is in her tail bone from the fall she experienced on the unit. Unclear on fall as this is first this nurse has been told about it.

## 2023-01-02 NOTE — ED Notes (Signed)
Pt going to [facility] Rm 34. Admission discussed with pt and she verbalized understanding. Pt transferred with all her personal belongings. Stable, in NAD and escorted in wheelchair with nurse and security.

## 2023-01-02 NOTE — ED Notes (Signed)
ivc/recommend inpatient admit.Marland KitchenMarland Kitchen

## 2023-01-02 NOTE — ED Notes (Signed)
Pt denies SI/HI/AVH on assessment

## 2023-01-02 NOTE — ED Notes (Signed)
Pt BP low 93/57, have to get pt up and moving and to push fluids before moving to geropsych.

## 2023-01-02 NOTE — Progress Notes (Signed)
   01/02/23 1600  Spiritual Encounters  Type of Visit Initial  Care provided to: Patient  Referral source Nurse (RN/NT/LPN)  Reason for visit Religious ritual  OnCall Visit Yes   Chaplain responded to nurse consult. Chaplain provided a Bible as requested. Chaplain services are available for follow up as needed.

## 2023-01-02 NOTE — Progress Notes (Signed)
Patient has been in the milieu. Currently watching TV in the dayroom. Pleasant and cooperative. Denying SI/HI. Denying hallucinations. Had a snack and HS medications. Has not expressed any concerns. Team continues to provide support and encouragements. Safety monitored as recommended.

## 2023-01-02 NOTE — H&P (Signed)
Psychiatric Admission Assessment Adult  Patient Identification: Yvonne Pitts MRN:  [id number] Date of Evaluation:  01/02/2023 Chief Complaint:  Bipolar Principal Diagnosis: Bipolar affective disorder, currently manic, moderate ([place]) Diagnosis:  Principal Problem:   Bipolar affective disorder, currently manic, moderate ([place]) Active Problems:   Tobacco abuse   Leukocytosis  History of Present Illness: Yvonne Pitts is a 67 year old white female who is involuntarily admitted to geriatric psychiatry for manic behavior.  Yvonne Pitts was recently discharged from this unit back in August for similar presentation.  She tells me that she does not like her medications and stopped them a few weeks ago.  I asked her why and she said because "they are killing me."  She was discharged on Lamictal and Seroquel.  She is unable to be more specific on what the problem is.  Husband IVC her because of her not sleeping and being more agitated.  She sees Dr. Modesta Messing outpatient.  She is currently taking Celexa and Zyprexa.  She sees neurology for cognitive and behavioral changes.  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  Associated Signs/Symptoms: Depression Symptoms:  insomnia, psychomotor agitation, anxiety, (Hypo) Manic Symptoms:  Elevated Mood, Anxiety Symptoms:  Social Anxiety, Psychotic Symptoms:   Unknown PTSD Symptoms: NA Total Time spent with patient: 1 hour  Past Psychiatric History: As above  Is the patient at risk to self? No.  Has the patient been a risk to self in the past 6 months? No.  Has the patient been a risk to self within the distant past? No.  Is the patient a risk to others? No.  Has the patient been a risk to others in the past 6 months? No.  Has the patient been a risk to others within the distant past? No.   [place] Scale:  [place] [facility] from 01/01/2023 in [place] Admission (Discharged) from 08/08/2022 in [place] ED from 08/07/2022 in [place] No Risk No Risk No Risk        Prior Inpatient Therapy: Yes.   If yes, describe [facility] Prior Outpatient Therapy: Yes.   If yes, describe Dr. Modesta Messing  Alcohol Screening:   Substance Abuse History in the last 12 months:  No. Consequences of Substance Abuse: NA Previous Psychotropic Medications: Yes  Psychological Evaluations: Yes  Past Medical History:  Past Medical History:  Diagnosis Date   Bipolar 1 disorder ([place])    COPD (chronic obstructive pulmonary disease) ([place]) 11/03/2016   DDD (degenerative disc disease), lumbar    Polycythemia    Ruptured disc, cervical    Scoliosis     Past Surgical History:  Procedure Laterality Date   BREAST EXCISIONAL BIOPSY Right    years ago   DILATION AND CURETTAGE OF UTERUS     NECK SURGERY     Family History:  Family History  Problem Relation Age of Onset   Anxiety disorder Brother    Breast cancer Maternal Aunt 40   Colon cancer Maternal Aunt    Bone cancer Paternal Aunt    Family Psychiatric  History: Unremarkable Tobacco Screening:  Social History   Tobacco Use  Smoking Status Every Day   Packs/day: 2.00   Years: 43.00   Total pack years: 86.00   Types: Cigarettes  Smokeless Tobacco Never    BH Tobacco Counseling     Are you interested in Tobacco Cessation Medications?  No value filed. Counseled  patient on smoking cessation:  No value filed. Reason Tobacco Screening Not Completed: No value filed.       Social History:  Social History   Substance and Sexual Activity  Alcohol Use Not Currently   Comment: seldom maybe 2-3 times a year     Social History   Substance and Sexual Activity  Drug Use No    Additional Social History:                           Allergies:   Allergies  Allergen Reactions   Prochlorperazine Maleate     Other reaction(s): Unknown   Ibuprofen Rash    Lab Results:  Results for orders placed or performed during the hospital encounter of 01/01/23 (from the past 48 hour(s))  CBC     Status: Abnormal   Collection Time: 01/01/23  5:38 AM  Result Value Ref Range   WBC 16.1 (H) 4.0 - 10.5 K/uL   RBC 4.26 3.87 - 5.11 MIL/uL   Hemoglobin 12.5 12.0 - 15.0 g/dL   HCT 38.3 36.0 - 46.0 %   MCV 89.9 80.0 - 100.0 fL   MCH 29.3 26.0 - 34.0 pg   MCHC 32.6 30.0 - 36.0 g/dL   RDW 14.6 11.5 - 15.5 %   Platelets 554 (H) 150 - 400 K/uL   nRBC 0.0 0.0 - 0.2 %    Comment: Performed at [facility], [address] [place], [place] [address]  Ethanol     Status: None   Collection Time: 01/01/23  5:38 AM  Result Value Ref Range   Alcohol, Ethyl (B) <10 <10 mg/dL    Comment: (NOTE) Lowest detectable limit for serum alcohol is 10 mg/dL.  For medical purposes only. Performed at [facility], [address] [place], [place] [address]   Salicylate level     Status: Abnormal   Collection Time: 01/01/23  5:38 AM  Result Value Ref Range   Salicylate Lvl <1.7 (L) 7.0 - 30.0 mg/dL    Comment: Performed at [facility], [address] [place], [place] [address]  Acetaminophen level     Status: Abnormal   Collection Time: 01/01/23  5:38 AM  Result Value Ref Range   Acetaminophen (Tylenol), Serum <10 (L) 10 - 30 ug/mL    Comment: (NOTE) Therapeutic concentrations vary significantly. A range of 10-30 ug/mL  may be an effective concentration for many patients. However, some  are best treated at concentrations outside of this range. Acetaminophen concentrations >150 ug/mL at 4 hours after ingestion  and >50 ug/mL at 12 hours after ingestion are often associated with  toxic reactions.  Performed at [facility], [address] [place], [place] [address]   Comprehensive metabolic panel     Status: Abnormal   Collection Time: 01/01/23  5:38 AM  Result Value Ref Range   Sodium 135 135 - 145 mmol/L   Potassium  3.5 3.5 - 5.1 mmol/L   Chloride 100 98 - 111 mmol/L   CO2 25 22 - 32 mmol/L   Glucose, Bld 110 (H) 70 - 99 mg/dL    Comment: Glucose reference range applies only to samples taken after fasting for at least 8 hours.   BUN 6 (L) 8 - 23 mg/dL   Creatinine, Ser 0.42 (L) 0.44 - 1.00 mg/dL   Calcium 8.7 (L) 8.9 - 10.3 mg/dL   Total Protein 7.5 6.5 - 8.1 g/dL   Albumin  3.2 (L) 3.5 - 5.0 g/dL   AST 15 15 - 41 U/L   ALT 11 0 - 44 U/L   Alkaline Phosphatase 69 38 - 126 U/L   Total Bilirubin 0.7 0.3 - 1.2 mg/dL   GFR, Estimated >60 >60 mL/min    Comment: (NOTE) Calculated using the CKD-EPI Creatinine Equation (2021)    Anion gap 10 5 - 15    Comment: Performed at [facility], [address] [place], [place] [address]  Resp panel by RT-PCR (RSV, Flu A&B, Covid) Anterior Nasal Swab     Status: None   Collection Time: 01/01/23  5:39 AM   Specimen: Anterior Nasal Swab  Result Value Ref Range   SARS Coronavirus 2 by RT PCR NEGATIVE NEGATIVE    Comment: (NOTE) SARS-CoV-2 target nucleic acids are NOT DETECTED.  The SARS-CoV-2 RNA is generally detectable in upper respiratory specimens during the acute phase of infection. The lowest concentration of SARS-CoV-2 viral copies this assay can detect is 138 copies/mL. A negative result does not preclude SARS-Cov-2 infection and should not be used as the sole basis for treatment or other patient management decisions. A negative result may occur with  improper specimen collection/handling, submission of specimen other than nasopharyngeal swab, presence of viral mutation(s) within the areas targeted by this assay, and inadequate number of viral copies(<138 copies/mL). A negative result must be combined with clinical observations, patient history, and epidemiological information. The expected result is Negative.  Fact Sheet for Patients:  EntrepreneurPulse.com.au  Fact Sheet for Healthcare Providers:   IncredibleEmployment.be  This test is no t yet approved or cleared by the [place] FDA and  has been authorized for detection and/or diagnosis of SARS-CoV-2 by FDA under an Emergency Use Authorization (EUA). This EUA will remain  in effect (meaning this test can be used) for the duration of the COVID-19 declaration under Section 564(b)(1) of the Act, 21 U.S.C.section 360bbb-3(b)(1), unless the authorization is terminated  or revoked sooner.       Influenza A by PCR NEGATIVE NEGATIVE   Influenza B by PCR NEGATIVE NEGATIVE    Comment: (NOTE) The Xpert Xpress SARS-CoV-2/FLU/RSV plus assay is intended as an aid in the diagnosis of influenza from Nasopharyngeal swab specimens and should not be used as a sole basis for treatment. Nasal washings and aspirates are unacceptable for Xpert Xpress SARS-CoV-2/FLU/RSV testing.  Fact Sheet for Patients: EntrepreneurPulse.com.au  Fact Sheet for Healthcare Providers: IncredibleEmployment.be  This test is not yet approved or cleared by the [place] FDA and has been authorized for detection and/or diagnosis of SARS-CoV-2 by FDA under an Emergency Use Authorization (EUA). This EUA will remain in effect (meaning this test can be used) for the duration of the COVID-19 declaration under Section 564(b)(1) of the Act, 21 U.S.C. section 360bbb-3(b)(1), unless the authorization is terminated or revoked.     Resp Syncytial Virus by PCR NEGATIVE NEGATIVE    Comment: (NOTE) Fact Sheet for Patients: EntrepreneurPulse.com.au  Fact Sheet for Healthcare Providers: IncredibleEmployment.be  This test is not yet approved or cleared by the [place] FDA and has been authorized for detection and/or diagnosis of SARS-CoV-2 by FDA under an Emergency Use Authorization (EUA). This EUA will remain in effect (meaning this test can be used) for the duration of  the COVID-19 declaration under Section 564(b)(1) of the Act, 21 U.S.C. section 360bbb-3(b)(1), unless the authorization is terminated or revoked.  Performed at [facility], [address] [place], [place] [address]  Urine Drug Screen, Qualitative ([facility] only)     Status: Abnormal   Collection Time: 01/01/23  7:55 AM  Result Value Ref Range   Tricyclic, Ur Screen POSITIVE (A) NONE DETECTED   Amphetamines, Ur Screen NONE DETECTED NONE DETECTED   MDMA (Ecstasy)Ur Screen NONE DETECTED NONE DETECTED   Cocaine Metabolite,Ur [place] NONE DETECTED NONE DETECTED   Opiate, Ur Screen NONE DETECTED NONE DETECTED   Phencyclidine (PCP) Ur S NONE DETECTED NONE DETECTED   Cannabinoid 50 Ng, Ur [place] NONE DETECTED NONE DETECTED   Barbiturates, Ur Screen NONE DETECTED NONE DETECTED   Benzodiazepine, Ur Scrn NONE DETECTED NONE DETECTED   Methadone Scn, Ur NONE DETECTED NONE DETECTED    Comment: (NOTE) Tricyclics + metabolites, urine    Cutoff 1000 ng/mL Amphetamines + metabolites, urine  Cutoff 1000 ng/mL MDMA (Ecstasy), urine              Cutoff 500 ng/mL Cocaine Metabolite, urine          Cutoff 300 ng/mL Opiate + metabolites, urine        Cutoff 300 ng/mL Phencyclidine (PCP), urine         Cutoff 25 ng/mL Cannabinoid, urine                 Cutoff 50 ng/mL Barbiturates + metabolites, urine  Cutoff 200 ng/mL Benzodiazepine, urine              Cutoff 200 ng/mL Methadone, urine                   Cutoff 300 ng/mL  The urine drug screen provides only a preliminary, unconfirmed analytical test result and should not be used for non-medical purposes. Clinical consideration and professional judgment should be applied to any positive drug screen result due to possible interfering substances. A more specific alternate chemical method must be used in order to obtain a confirmed analytical result. Gas chromatography / mass spectrometry (GC/MS) is the preferred confirm atory method. Performed at  [facility], [address] [place], [place] [address]   Urinalysis, Routine w reflex microscopic     Status: Abnormal   Collection Time: 01/01/23  7:55 AM  Result Value Ref Range   Color, Urine YELLOW (A) YELLOW   APPearance CLEAR (A) CLEAR   Specific Gravity, Urine 1.008 1.005 - 1.030   pH 6.0 5.0 - 8.0   Glucose, UA NEGATIVE NEGATIVE mg/dL   Hgb urine dipstick NEGATIVE NEGATIVE   Bilirubin Urine NEGATIVE NEGATIVE   Ketones, ur 5 (A) NEGATIVE mg/dL   Protein, ur NEGATIVE NEGATIVE mg/dL   Nitrite NEGATIVE NEGATIVE   Leukocytes,Ua NEGATIVE NEGATIVE    Comment: Performed at [facility], [address], [place], [place] [address]  Urine Culture     Status: Abnormal   Collection Time: 01/01/23  7:55 AM   Specimen: Urine, Clean Catch  Result Value Ref Range   Specimen Description      URINE, CLEAN CATCH Performed at [facility], [address] [place], [name] [address]    Special Requests      NONE Performed at [facility], [address] [place], [place] [address]    Culture MULTIPLE SPECIES PRESENT, SUGGEST RECOLLECTION (A)    Report Status 01/02/2023 FINAL     Blood Alcohol level:  Lab Results  Component Value Date   [facility] <10 01/01/2023   ETH <10 49/44/9675    Metabolic Disorder Labs:  Lab Results  Component Value Date  HGBA1C 5.4 08/09/2022   MPG 108.28 08/09/2022   No results found for: "PROLACTIN" Lab Results  Component Value Date   CHOL 182 08/09/2022   TRIG 106 08/09/2022   HDL 65 08/09/2022   CHOLHDL 2.8 08/09/2022   VLDL 21 08/09/2022   LDLCALC 96 08/09/2022    Current Medications: No current facility-administered medications for this encounter.   PTA Medications: Medications Prior to Admission  Medication Sig Dispense Refill Last Dose   Albuterol Sulfate (PROAIR RESPICLICK) 947 (90 Base) MCG/ACT AEPB Inhale 2 puffs into the lungs every 6 (six) hours as needed. 2 each 3    alendronate (FOSAMAX) 70  MG tablet Take 70 mg by mouth once a week. Take with a full glass of water on an empty stomach.      amLODipine (NORVASC) 2.5 MG tablet Take 2.5 mg by mouth daily.      budesonide-formoterol (SYMBICORT) 160-4.5 MCG/ACT inhaler Inhale 2 puffs into the lungs 2 (two) times daily.      citalopram (CELEXA) 20 MG tablet Take 1 tablet (20 mg total) by mouth daily. 30 tablet 3    cyanocobalamin 1000 MCG tablet Take 1 tablet (1,000 mcg total) by mouth daily. 30 tablet 3    lamoTRIgine (LAMICTAL) 25 MG tablet Take 25 mg by mouth 2 (two) times daily.      omeprazole (PRILOSEC) 20 MG capsule Take 20 mg by mouth daily.      QUEtiapine (SEROQUEL) 100 MG tablet Take 1 tablet (100 mg total) by mouth at bedtime. 30 tablet 1    QUEtiapine (SEROQUEL) 50 MG tablet Take 3 tablets (150 mg total) by mouth at bedtime. (Patient not taking: Reported on 01/01/2023) 90 tablet 3    terbinafine (LAMISIL) 250 MG tablet Take 250 mg by mouth daily.       Musculoskeletal: Strength & Muscle Tone: within normal limits Gait & Station: normal Patient leans: N/A            Psychiatric Specialty Exam:  Presentation  General Appearance:  Bizarre  Eye Contact: Good  Speech: Clear and Coherent  Speech Volume: Normal  Handedness:No data recorded  Mood and Affect  Mood: Anxious; Labile; Euphoric  Affect: Congruent   Thought Process  Thought Processes: Disorganized  Duration of Psychotic Symptoms:N/A Past Diagnosis of Schizophrenia or Psychoactive disorder: No  Descriptions of Associations:Loose  Orientation:Partial  Thought Content:Illogical  Hallucinations:No data recorded Ideas of Reference:None  Suicidal Thoughts:No data recorded Homicidal Thoughts:No data recorded  Sensorium  Memory: Immediate Poor; Recent Poor  Judgment: Impaired  Insight: Lacking   Executive Functions  Concentration: Poor  Attention Span: Poor  Recall: Poor  Fund of  Knowledge: Poor  Language: Poor   Psychomotor Activity  Psychomotor Activity:No data recorded  Assets  Assets: Resilience; Social Support; Housing; Financial Resources/Insurance   Sleep  Sleep:No data recorded   Physical Exam: Physical Exam Constitutional:      Appearance: Normal appearance.  HENT:     Head: Normocephalic and atraumatic.     Mouth/Throat:     Pharynx: Oropharynx is clear.  Eyes:     Pupils: Pupils are equal, round, and reactive to light.  Cardiovascular:     Rate and Rhythm: Normal rate and regular rhythm.  Pulmonary:     Effort: Pulmonary effort is normal.     Breath sounds: Normal breath sounds.  Abdominal:     General: Abdomen is flat.     Palpations: Abdomen is soft.  Musculoskeletal:  General: Normal range of motion.  Skin:    General: Skin is warm and dry.  Neurological:     General: No focal deficit present.     Mental Status: She is alert. Mental status is at baseline.  Psychiatric:        Attention and Perception: Attention and perception normal.        Mood and Affect: Mood is anxious and elated. Affect is inappropriate.        Speech: Speech normal.        Behavior: Behavior is agitated. Behavior is cooperative.        Thought Content: Thought content normal.        Cognition and Memory: Cognition and memory normal.        Judgment: Judgment is impulsive.    Review of Systems  Constitutional: Negative.   HENT: Negative.    Eyes: Negative.   Respiratory: Negative.    Cardiovascular: Negative.   Gastrointestinal: Negative.   Genitourinary: Negative.   Musculoskeletal: Negative.   Skin: Negative.   Neurological: Negative.   Endo/Heme/Allergies: Negative.   Psychiatric/Behavioral:  The patient is nervous/anxious and has insomnia.    There were no vitals taken for this visit. There is no height or weight on file to calculate BMI.  Treatment Plan Summary: Daily contact with patient to assess and evaluate symptoms and  progress in treatment, Medication management, and Plan see orders  Observation Level/Precautions:  15 minute checks  Laboratory:  CBC Chemistry Profile HbAIC  Psychotherapy:    Medications:    Consultations:    Discharge Concerns:    Estimated LOS:  Other:     Physician Treatment Plan for Primary Diagnosis: Bipolar affective disorder, currently manic, moderate ([place]) Long Term Goal(s): Improvement in symptoms so as ready for discharge  Short Term Goals: Ability to identify changes in lifestyle to reduce recurrence of condition will improve, Ability to verbalize feelings will improve, Ability to disclose and discuss suicidal ideas, Ability to demonstrate self-control will improve, Ability to identify and develop effective coping behaviors will improve, Ability to maintain clinical measurements within normal limits will improve, Compliance with prescribed medications will improve, and Ability to identify triggers associated with substance abuse/mental health issues will improve  Physician Treatment Plan for Secondary Diagnosis: Principal Problem:   Bipolar affective disorder, currently manic, moderate ([place]) Active Problems:   Tobacco abuse   Leukocytosis  Couple's  I certify that inpatient services furnished can reasonably be expected to improve the patient's condition.    Parks Ranger, DO 1/5/20242:29 PM

## 2023-01-02 NOTE — Consult Note (Signed)
[facility] Face-to-Face Psychiatry Consult   Reason for Consult:  mania Referring Physician:  EDP Patient Identification: Yvonne Pitts MRN:  [id number] Principal Diagnosis: bipolar affective disorder, mania, moderate Diagnosis:  bipolar affective disorder, mania, moderate   Total Time spent with patient: 45 minutes  Subjective:   Yvonne Pitts is a 67 y.o. female patient admitted with mania symptoms, lack of sleep.  Today, the client is calmer and reported she slept most of the night.  She is tearful at times and hyper-religious.  HPI:  67 yo female presented with mania symptoms, "I've been up all day and night all day today."  She is talking fast and tangential at times.  "I got to get my daughter and Chief Executive Officer.  He just left and abandoned me (husband)."  She is anxious but denies depression.  She reports she takes medical medications, no psych meds mentioned.  Inpatient recommended for stabilization.  Past Psychiatric History: mania symptoms, anxiety  Risk to Self:  none Risk to Others:  none Prior Inpatient Therapy:  unknown Prior Outpatient Therapy:  Dr Modesta Messing  Past Medical History:  Past Medical History:  Diagnosis Date   Bipolar 1 disorder ([place])    COPD (chronic obstructive pulmonary disease) ([place]) 11/03/2016   DDD (degenerative disc disease), lumbar    Polycythemia    Ruptured disc, cervical    Scoliosis     Past Surgical History:  Procedure Laterality Date   BREAST EXCISIONAL BIOPSY Right    years ago   DILATION AND CURETTAGE OF UTERUS     NECK SURGERY     Family History:  Family History  Problem Relation Age of Onset   Anxiety disorder Brother    Breast cancer Maternal Aunt 40   Colon cancer Maternal Aunt    Bone cancer Paternal Aunt    Family Psychiatric  History: see above Social History:  Social History   Substance and Sexual Activity  Alcohol Use Not Currently   Comment: seldom maybe 2-3 times a year     Social History   Substance and Sexual  Activity  Drug Use No    Social History   Socioeconomic History   Marital status: Married    Spouse name: Not on file   Number of children: 2   Years of education: Not on file   Highest education level: Some college, no degree  Occupational History   Not on file  Tobacco Use   Smoking status: Every Day    Packs/day: 2.00    Years: 43.00    Total pack years: 86.00    Types: Cigarettes   Smokeless tobacco: Never  Vaping Use   Vaping Use: Never used  Substance and Sexual Activity   Alcohol use: Not Currently    Comment: seldom maybe 2-3 times a year   Drug use: No   Sexual activity: Not Currently  Other Topics Concern   Not on file  Social History Narrative   Not on file   Social Determinants of Health   Financial Resource Strain: Not on file  Food Insecurity: Not on file  Transportation Needs: Not on file  Physical Activity: Not on file  Stress: Not on file  Social Connections: Not on file   Additional Social History:  none    Allergies:   Allergies  Allergen Reactions   Prochlorperazine Maleate     Other reaction(s): Unknown   Ibuprofen Rash    Labs:  Results for orders placed or performed during the hospital encounter of  01/01/23 (from the past 48 hour(s))  CBC     Status: Abnormal   Collection Time: 01/01/23  5:38 AM  Result Value Ref Range   WBC 16.1 (H) 4.0 - 10.5 K/uL   RBC 4.26 3.87 - 5.11 MIL/uL   Hemoglobin 12.5 12.0 - 15.0 g/dL   HCT 38.3 36.0 - 46.0 %   MCV 89.9 80.0 - 100.0 fL   MCH 29.3 26.0 - 34.0 pg   MCHC 32.6 30.0 - 36.0 g/dL   RDW 14.6 11.5 - 15.5 %   Platelets 554 (H) 150 - 400 K/uL   nRBC 0.0 0.0 - 0.2 %    Comment: Performed at [facility], [address] [place], [place] [address]  Ethanol     Status: None   Collection Time: 01/01/23  5:38 AM  Result Value Ref Range   Alcohol, Ethyl (B) <10 <10 mg/dL    Comment: (NOTE) Lowest detectable limit for serum alcohol is 10 mg/dL.  For medical purposes only. Performed  at [facility], [address] [place], [place] [address]   Salicylate level     Status: Abnormal   Collection Time: 01/01/23  5:38 AM  Result Value Ref Range   Salicylate Lvl <6.0 (L) 7.0 - 30.0 mg/dL    Comment: Performed at [facility], [address] [place], [place] [address]  Acetaminophen level     Status: Abnormal   Collection Time: 01/01/23  5:38 AM  Result Value Ref Range   Acetaminophen (Tylenol), Serum <10 (L) 10 - 30 ug/mL    Comment: (NOTE) Therapeutic concentrations vary significantly. A range of 10-30 ug/mL  may be an effective concentration for many patients. However, some  are best treated at concentrations outside of this range. Acetaminophen concentrations >150 ug/mL at 4 hours after ingestion  and >50 ug/mL at 12 hours after ingestion are often associated with  toxic reactions.  Performed at [facility], [address] [place], [place] [address]   Comprehensive metabolic panel     Status: Abnormal   Collection Time: 01/01/23  5:38 AM  Result Value Ref Range   Sodium 135 135 - 145 mmol/L   Potassium 3.5 3.5 - 5.1 mmol/L   Chloride 100 98 - 111 mmol/L   CO2 25 22 - 32 mmol/L   Glucose, Bld 110 (H) 70 - 99 mg/dL    Comment: Glucose reference range applies only to samples taken after fasting for at least 8 hours.   BUN 6 (L) 8 - 23 mg/dL   Creatinine, Ser 0.42 (L) 0.44 - 1.00 mg/dL   Calcium 8.7 (L) 8.9 - 10.3 mg/dL   Total Protein 7.5 6.5 - 8.1 g/dL   Albumin 3.2 (L) 3.5 - 5.0 g/dL   AST 15 15 - 41 U/L   ALT 11 0 - 44 U/L   Alkaline Phosphatase 69 38 - 126 U/L   Total Bilirubin 0.7 0.3 - 1.2 mg/dL   GFR, Estimated >60 >60 mL/min    Comment: (NOTE) Calculated using the CKD-EPI Creatinine Equation (2021)    Anion gap 10 5 - 15    Comment: Performed at [facility], [address] [place], [place] [address]  Resp panel by RT-PCR (RSV, Flu A&B, Covid) Anterior Nasal Swab     Status: None   Collection Time:  01/01/23  5:39 AM   Specimen: Anterior Nasal Swab  Result Value Ref Range   SARS Coronavirus 2 by RT PCR NEGATIVE NEGATIVE  Comment: (NOTE) SARS-CoV-2 target nucleic acids are NOT DETECTED.  The SARS-CoV-2 RNA is generally detectable in upper respiratory specimens during the acute phase of infection. The lowest concentration of SARS-CoV-2 viral copies this assay can detect is 138 copies/mL. A negative result does not preclude SARS-Cov-2 infection and should not be used as the sole basis for treatment or other patient management decisions. A negative result may occur with  improper specimen collection/handling, submission of specimen other than nasopharyngeal swab, presence of viral mutation(s) within the areas targeted by this assay, and inadequate number of viral copies(<138 copies/mL). A negative result must be combined with clinical observations, patient history, and epidemiological information. The expected result is Negative.  Fact Sheet for Patients:  EntrepreneurPulse.com.au  Fact Sheet for Healthcare Providers:  IncredibleEmployment.be  This test is no t yet approved or cleared by the [place] FDA and  has been authorized for detection and/or diagnosis of SARS-CoV-2 by FDA under an Emergency Use Authorization (EUA). This EUA will remain  in effect (meaning this test can be used) for the duration of the COVID-19 declaration under Section 564(b)(1) of the Act, 21 U.S.C.section 360bbb-3(b)(1), unless the authorization is terminated  or revoked sooner.       Influenza A by PCR NEGATIVE NEGATIVE   Influenza B by PCR NEGATIVE NEGATIVE    Comment: (NOTE) The Xpert Xpress SARS-CoV-2/FLU/RSV plus assay is intended as an aid in the diagnosis of influenza from Nasopharyngeal swab specimens and should not be used as a sole basis for treatment. Nasal washings and aspirates are unacceptable for Xpert Xpress  SARS-CoV-2/FLU/RSV testing.  Fact Sheet for Patients: EntrepreneurPulse.com.au  Fact Sheet for Healthcare Providers: IncredibleEmployment.be  This test is not yet approved or cleared by the [place] FDA and has been authorized for detection and/or diagnosis of SARS-CoV-2 by FDA under an Emergency Use Authorization (EUA). This EUA will remain in effect (meaning this test can be used) for the duration of the COVID-19 declaration under Section 564(b)(1) of the Act, 21 U.S.C. section 360bbb-3(b)(1), unless the authorization is terminated or revoked.     Resp Syncytial Virus by PCR NEGATIVE NEGATIVE    Comment: (NOTE) Fact Sheet for Patients: EntrepreneurPulse.com.au  Fact Sheet for Healthcare Providers: IncredibleEmployment.be  This test is not yet approved or cleared by the [place] FDA and has been authorized for detection and/or diagnosis of SARS-CoV-2 by FDA under an Emergency Use Authorization (EUA). This EUA will remain in effect (meaning this test can be used) for the duration of the COVID-19 declaration under Section 564(b)(1) of the Act, 21 U.S.C. section 360bbb-3(b)(1), unless the authorization is terminated or revoked.  Performed at [facility], [address] [place], [place] [address]   Urine Drug Screen, Qualitative [facility] only)     Status: Abnormal   Collection Time: 01/01/23  7:55 AM  Result Value Ref Range   Tricyclic, Ur Screen POSITIVE (A) NONE DETECTED   Amphetamines, Ur Screen NONE DETECTED NONE DETECTED   MDMA (Ecstasy)Ur Screen NONE DETECTED NONE DETECTED   Cocaine Metabolite,Ur [place] NONE DETECTED NONE DETECTED   Opiate, Ur Screen NONE DETECTED NONE DETECTED   Phencyclidine (PCP) Ur S NONE DETECTED NONE DETECTED   Cannabinoid 50 Ng, Ur [place] NONE DETECTED NONE DETECTED   Barbiturates, Ur Screen NONE DETECTED NONE DETECTED   Benzodiazepine, Ur Scrn NONE DETECTED NONE  DETECTED   Methadone Scn, Ur NONE DETECTED NONE DETECTED    Comment: (NOTE) Tricyclics + metabolites, urine    Cutoff 1000 ng/mL Amphetamines +  metabolites, urine  Cutoff 1000 ng/mL MDMA (Ecstasy), urine              Cutoff 500 ng/mL Cocaine Metabolite, urine          Cutoff 300 ng/mL Opiate + metabolites, urine        Cutoff 300 ng/mL Phencyclidine (PCP), urine         Cutoff 25 ng/mL Cannabinoid, urine                 Cutoff 50 ng/mL Barbiturates + metabolites, urine  Cutoff 200 ng/mL Benzodiazepine, urine              Cutoff 200 ng/mL Methadone, urine                   Cutoff 300 ng/mL  The urine drug screen provides only a preliminary, unconfirmed analytical test result and should not be used for non-medical purposes. Clinical consideration and professional judgment should be applied to any positive drug screen result due to possible interfering substances. A more specific alternate chemical method must be used in order to obtain a confirmed analytical result. Gas chromatography / mass spectrometry (GC/MS) is the preferred confirm atory method. Performed at [facility], [address] [place], [place] [address]   Urinalysis, Routine w reflex microscopic     Status: Abnormal   Collection Time: 01/01/23  7:55 AM  Result Value Ref Range   Color, Urine YELLOW (A) YELLOW   APPearance CLEAR (A) CLEAR   Specific Gravity, Urine 1.008 1.005 - 1.030   pH 6.0 5.0 - 8.0   Glucose, UA NEGATIVE NEGATIVE mg/dL   Hgb urine dipstick NEGATIVE NEGATIVE   Bilirubin Urine NEGATIVE NEGATIVE   Ketones, ur 5 (A) NEGATIVE mg/dL   Protein, ur NEGATIVE NEGATIVE mg/dL   Nitrite NEGATIVE NEGATIVE   Leukocytes,Ua NEGATIVE NEGATIVE    Comment: Performed at [facility], [address] [place],  [address]    Current Facility-Administered Medications  Medication Dose Route Frequency Provider Last Rate Last Admin   acetaminophen (TYLENOL) tablet 650 mg  650 mg Oral Q6H PRN  Duffy Bruce, MD   650 mg at 01/02/23 0955   albuterol (PROVENTIL) (2.5 MG/3ML) 0.083% nebulizer solution 3 mL  3 mL Inhalation Q6H PRN Vanessa [place], MD       citalopram (CELEXA) tablet 20 mg  20 mg Oral Daily Patrecia Pour, NP   20 mg at 01/02/23 3382   cloNIDine (CATAPRES) tablet 0.1 mg  0.1 mg Oral Daily Patrecia Pour, NP   0.1 mg at 01/02/23 5053   cyanocobalamin (VITAMIN B12) tablet 1,000 mcg  1,000 mcg Oral Daily Patrecia Pour, NP   1,000 mcg at 01/02/23 9767   nicotine (NICODERM CQ - dosed in mg/24 hours) patch 14 mg  14 mg Transdermal Once Vanessa [place], MD   14 mg at 01/01/23 1734   OLANZapine (ZYPREXA) tablet 5 mg  5 mg Oral BID Patrecia Pour, NP   5 mg at 01/02/23 3419   traZODone (DESYREL) tablet 50 mg  50 mg Oral QHS PRN Patrecia Pour, NP       Current Outpatient Medications  Medication Sig Dispense Refill   Albuterol Sulfate (PROAIR RESPICLICK) 379 (90 Base) MCG/ACT AEPB Inhale 2 puffs into the lungs every 6 (six) hours as needed. 2 each 3   alendronate (FOSAMAX) 70 MG tablet Take 70 mg by mouth once a week. Take with a full glass of  water on an empty stomach.     amLODipine (NORVASC) 2.5 MG tablet Take 2.5 mg by mouth daily.     budesonide-formoterol (SYMBICORT) 160-4.5 MCG/ACT inhaler Inhale 2 puffs into the lungs 2 (two) times daily.     citalopram (CELEXA) 20 MG tablet Take 1 tablet (20 mg total) by mouth daily. 30 tablet 3   cyanocobalamin 1000 MCG tablet Take 1 tablet (1,000 mcg total) by mouth daily. 30 tablet 3   lamoTRIgine (LAMICTAL) 25 MG tablet Take 25 mg by mouth 2 (two) times daily.     omeprazole (PRILOSEC) 20 MG capsule Take 20 mg by mouth daily.     QUEtiapine (SEROQUEL) 100 MG tablet Take 1 tablet (100 mg total) by mouth at bedtime. 30 tablet 1   terbinafine (LAMISIL) 250 MG tablet Take 250 mg by mouth daily.     QUEtiapine (SEROQUEL) 50 MG tablet Take 3 tablets (150 mg total) by mouth at bedtime. (Patient not taking: Reported on 01/01/2023) 90 tablet  3    Musculoskeletal: Strength & Muscle Tone: within normal limits Gait & Station: normal Patient leans: N/A  Psychiatric Specialty Exam: Physical Exam Vitals and nursing note reviewed.  Constitutional:      Appearance: Normal appearance.  HENT:     Head: Normocephalic.     Nose: Nose normal.  Pulmonary:     Effort: Pulmonary effort is normal.  Musculoskeletal:        General: Normal range of motion.     Cervical back: Normal range of motion.  Neurological:     General: No focal deficit present.     Mental Status: She is alert and oriented to person, place, and time.  Psychiatric:        Attention and Perception: Attention normal.        Mood and Affect: Mood is anxious. Affect is blunt.        Speech: Speech normal.        Behavior: Behavior normal. Behavior is cooperative.        Thought Content: Thought content is delusional.        Cognition and Memory: Cognition and memory normal.        Judgment: Judgment normal.     Review of Systems  Psychiatric/Behavioral:  The patient is nervous/anxious.   All other systems reviewed and are negative.   Blood pressure 120/77, pulse 78, temperature 98.1 F (36.7 C), temperature source Oral, resp. rate 18, height '5\' 4"'$  (1.626 m), weight 55 kg, SpO2 95 %.Body mass index is 20.81 kg/m.  General Appearance: Disheveled  Eye Contact:  Fair  Speech:  Normal Rate  Volume:  Normal  Mood:  Anxious  Affect:  Blunt  Thought Process:  Tangential at times  Orientation:  Full (Time, Place, and Person)  Thought Content:  Delusions  Suicidal Thoughts:  No  Homicidal Thoughts:  No  Memory:  Immediate;   Fair Recent;   Fair Remote;   Fair  Judgement:  Impaired  Insight:  Lacking  Psychomotor Activity:  Normal  Concentration:  Concentration: Fair and Attention Span: Fair  Recall:  [employer] of Knowledge:  Fair  Language:  Good  Akathisia:  No  Handed:  Right  AIMS (if indicated):     Assets:  Housing Leisure Time Physical  Health Resilience Social Support  ADL's:  Intact  Cognition:  Impaired,  Mild  Sleep:        Physical Exam: Physical Exam Vitals and nursing note reviewed.  Constitutional:  Appearance: Normal appearance.  HENT:     Head: Normocephalic.     Nose: Nose normal.  Pulmonary:     Effort: Pulmonary effort is normal.  Musculoskeletal:        General: Normal range of motion.     Cervical back: Normal range of motion.  Neurological:     General: No focal deficit present.     Mental Status: She is alert and oriented to person, place, and time.  Psychiatric:        Attention and Perception: Attention normal.        Mood and Affect: Mood is anxious. Affect is blunt.        Speech: Speech normal.        Behavior: Behavior normal. Behavior is cooperative.        Thought Content: Thought content is delusional.        Cognition and Memory: Cognition and memory normal.        Judgment: Judgment normal.    Review of Systems  Psychiatric/Behavioral:  The patient is nervous/anxious.   All other systems reviewed and are negative.  Blood pressure 120/77, pulse 78, temperature 98.1 F (36.7 C), temperature source Oral, resp. rate 18, height '5\' 4"'$  (1.626 m), weight 55 kg, SpO2 95 %. Body mass index is 20.81 kg/m.  Treatment Plan Summary: Daily contact with patient to assess and evaluate symptoms and progress in treatment, Medication management, and Plan : Bipolar affective disorder, mania, moderate: Zyprexa 5 mg BID  Insomnia: Trazodone 50 mg at bedtime PRN  Disposition: Recommend psychiatric Inpatient admission when medically cleared.  Waylan Boga, NP 01/02/2023 10:13 AM

## 2023-01-02 NOTE — BHH Suicide Risk Assessment (Signed)
[facility] Admission Suicide Risk Assessment   Nursing information obtained from:    Demographic factors:    Current Mental Status:    Loss Factors:    Historical Factors:    Risk Reduction Factors:     Total Time spent with patient: 1 hour Principal Problem: Bipolar affective disorder, currently manic, moderate (HCC) Diagnosis:  Principal Problem:   Bipolar affective disorder, currently manic, moderate (HCC) Active Problems:   Tobacco abuse   Leukocytosis  Subjective Data: Yvonne Pitts is a 67 y.o. female with bipolar who comes in with concerns for mania.  Patient also has a history of COPD.  Patient reports being off of his medications for his psychiatric for the past few months.  I reviewed his note from 12/16/2022.  Patient comes in under IVC denies any SI or HI.  Patient reports that she was promised to have a cigarette when she is cleared.  Patient is upset that she has been here for a long time has not been cleared yet.  Tried to explain to patient the process of IVC but patient kept interrupting me.  Offered patient nicotine patch but patient declined.  Patient unable to be redirected.  She denies any fevers cough or other concerns.   Continued Clinical Symptoms:    The "Alcohol Use Disorders Identification Test", Guidelines for Use in Primary Care, Second Edition.  World Pharmacologist [facility]). Score between 0-7:  no or low risk or alcohol related problems. Score between 8-15:  moderate risk of alcohol related problems. Score between 16-19:  high risk of alcohol related problems. Score 20 or above:  warrants further diagnostic evaluation for alcohol dependence and treatment.   CLINICAL FACTORS:   Bipolar Disorder:   Bipolar II Previous Psychiatric Diagnoses and Treatments   Musculoskeletal: Strength & Muscle Tone: within normal limits Gait & Station: normal Patient leans: N/A  Psychiatric Specialty Exam:  Presentation  General Appearance:  Bizarre  Eye  Contact: Good  Speech: Clear and Coherent  Speech Volume: Normal  Handedness:No data recorded  Mood and Affect  Mood: Anxious; Labile; Euphoric  Affect: Congruent   Thought Process  Thought Processes: Disorganized  Descriptions of Associations:Loose  Orientation:Partial  Thought Content:Illogical  History of Schizophrenia/Schizoaffective disorder:No  Duration of Psychotic Symptoms:No data recorded Hallucinations:No data recorded Ideas of Reference:None  Suicidal Thoughts:No data recorded Homicidal Thoughts:No data recorded  Sensorium  Memory: Immediate Poor; Recent Poor  Judgment: Impaired  Insight: Lacking   Executive Functions  Concentration: Poor  Attention Span: Poor  Recall: Poor  Fund of Knowledge: Poor  Language: Poor   Psychomotor Activity  Psychomotor Activity:No data recorded  Assets  Assets: Resilience; Social Support; Housing; Financial Resources/Insurance   Sleep  Sleep:No data recorded    There were no vitals taken for this visit. There is no height or weight on file to calculate BMI.   COGNITIVE FEATURES THAT CONTRIBUTE TO RISK:  Closed-mindedness    SUICIDE RISK:   Minimal: No identifiable suicidal ideation.  Patients presenting with no risk factors but with morbid ruminations; may be classified as minimal risk based on the severity of the depressive symptoms  PLAN OF CARE: See orders  I certify that inpatient services furnished can reasonably be expected to improve the patient's condition.   [place], DO 01/02/2023, 2:42 PM

## 2023-01-02 NOTE — ED Provider Notes (Signed)
Patient has been cleared and will be admitted to [facility] unit. She is otherwise stable. Exam is reassuring.   Duffy Bruce, MD 01/02/23 1309

## 2023-01-02 NOTE — ED Notes (Signed)
Pt is still under IVC- EPIC charting error not allowing d/c of patient until "IVC" & "continue IVC" both D/Cd. Patient is still under full IVC and receiving unit aware. Raquel, RN Recruitment consultant) aware of EPIC malfunction as well.

## 2023-01-02 NOTE — Plan of Care (Signed)
  Problem: Education: Goal: Knowledge of General Education information will improve Description: Including pain rating scale, medication(s)/side effects and non-pharmacologic comfort measures Outcome: Not Progressing   Problem: Health Behavior/Discharge Planning: Goal: Ability to manage health-related needs will improve Outcome: Not Progressing   Problem: Clinical Measurements: Goal: Ability to maintain clinical measurements within normal limits will improve Outcome: Not Progressing Goal: Will remain free from infection Outcome: Not Progressing Goal: Diagnostic test results will improve Outcome: Not Progressing Goal: Respiratory complications will improve Outcome: Not Progressing Goal: Cardiovascular complication will be avoided Outcome: Not Progressing   Problem: Activity: Goal: Risk for activity intolerance will decrease Outcome: Not Progressing   Problem: Nutrition: Goal: Adequate nutrition will be maintained Outcome: Not Progressing   Problem: Coping: Goal: Level of anxiety will decrease Outcome: Not Progressing   Problem: Elimination: Goal: Will not experience complications related to bowel motility Outcome: Not Progressing Goal: Will not experience complications related to urinary retention Outcome: Not Progressing   Problem: Pain Managment: Goal: General experience of comfort will improve Outcome: Not Progressing   Problem: Safety: Goal: Ability to remain free from injury will improve Outcome: Not Progressing   Problem: Skin Integrity: Goal: Risk for impaired skin integrity will decrease Outcome: Not Progressing   Problem: Education: Goal: Knowledge of [facility] Education information/materials will improve Outcome: Not Progressing Goal: Emotional status will improve Outcome: Not Progressing Goal: Mental status will improve Outcome: Not Progressing Goal: Verbalization of understanding the information provided will improve Outcome: Not  Progressing   Problem: Activity: Goal: Interest or engagement in activities will improve Outcome: Not Progressing Goal: Sleeping patterns will improve Outcome: Not Progressing   Problem: Coping: Goal: Ability to verbalize frustrations and anger appropriately will improve Outcome: Not Progressing Goal: Ability to demonstrate self-control will improve Outcome: Not Progressing   Problem: Health Behavior/Discharge Planning: Goal: Identification of resources available to assist in meeting health care needs will improve Outcome: Not Progressing Goal: Compliance with treatment plan for underlying cause of condition will improve Outcome: Not Progressing   Problem: Physical Regulation: Goal: Ability to maintain clinical measurements within normal limits will improve Outcome: Not Progressing   Problem: Safety: Goal: Periods of time without injury will increase Outcome: Not Progressing   Problem: Education: Goal: Ability to state activities that reduce stress will improve Outcome: Not Progressing   Problem: Coping: Goal: Ability to identify and develop effective coping behavior will improve Outcome: Not Progressing   Problem: Self-Concept: Goal: Ability to identify factors that promote anxiety will improve Outcome: Not Progressing Goal: Level of anxiety will decrease Outcome: Not Progressing Goal: Ability to modify response to factors that promote anxiety will improve Outcome: Not Progressing

## 2023-01-03 DIAGNOSIS — F3112 Bipolar disorder, current episode manic without psychotic features, moderate: Secondary | ICD-10-CM | POA: Diagnosis not present

## 2023-01-03 NOTE — Progress Notes (Addendum)
Patient asked RN to look at her bottom.  Patient states "I  fell when I was under involuntary committment."   Patient is confused and this writer could not clarify when this fall occurred.  Patient has bruise on upper left buttock.    After speaking with patient, and reading her notes, the fall occurred in the ER.

## 2023-01-03 NOTE — Plan of Care (Signed)
  Problem: Education: Goal: Knowledge of General Education information will improve Description: Including pain rating scale, medication(s)/side effects and non-pharmacologic comfort measures Outcome: Progressing   Problem: Health Behavior/Discharge Planning: Goal: Ability to manage health-related needs will improve Outcome: Progressing   Problem: Activity: Goal: Risk for activity intolerance will decrease Outcome: Progressing   Problem: Nutrition: Goal: Adequate nutrition will be maintained Outcome: Progressing   Problem: Coping: Goal: Level of anxiety will decrease Outcome: Progressing   Problem: Safety: Goal: Ability to remain free from injury will improve Outcome: Not Progressing

## 2023-01-03 NOTE — [facility] Group Notes (Signed)
[facility] Group Notes:  (Nursing/MHT/Case Management/Adjunct)  Date:  01/03/2023  Time:  3:22 PM  Type of Therapy:   BINGO  Participation Level:  Did Not Attend  Participation Quality:    Affect:    Cognitive:    Insight:    Engagement in Group:    Modes of Intervention:    Summary of Progress/Problems:  Boisvert, Amy T 01/03/2023, 3:22 PM

## 2023-01-03 NOTE — Progress Notes (Signed)
Patient with appropriate affect.   Patient is pleasant and tells all staff she loves them.  Patient denies anxiety and depression.  Denies SI/HI and AVH.  Patient reports she slept well and ate a good breakfast.  Compliant with scheduled medications.  15 min checks in place.  Patient is safe no the unit.  Present in the milieu. Appropriate interaction with peers.

## 2023-01-03 NOTE — Progress Notes (Signed)
[facility] MD Progress Note  01/03/2023 3:06 PM Yvonne Pitts  MRN:  [id number] Subjective: Follow-up for 67 year old woman with bipolar like psychosis.  Patient was smiling and giggling throughout our interview.  She seemed to be trying to tell me a story about her brother but kept getting off track and tangential.  Denies feeling bad denies being depressed denies suicidal thoughts.  Physically has no complaints.  Appears to be tolerating medicine adequately. Principal Problem: Bipolar affective disorder, currently manic, moderate (HCC) Diagnosis: Principal Problem:   Bipolar affective disorder, currently manic, moderate (HCC) Active Problems:   Tobacco abuse   Leukocytosis  Total Time spent with patient: 30 minutes  Past Psychiatric History: Past history of other manic like psychotic episodes  Past Medical History:  Past Medical History:  Diagnosis Date   Bipolar 1 disorder ([place])    COPD (chronic obstructive pulmonary disease) ([place]) 11/03/2016   DDD (degenerative disc disease), lumbar    Polycythemia    Ruptured disc, cervical    Scoliosis     Past Surgical History:  Procedure Laterality Date   BREAST EXCISIONAL BIOPSY Right    years ago   DILATION AND CURETTAGE OF UTERUS     NECK SURGERY     Family History:  Family History  Problem Relation Age of Onset   Anxiety disorder Brother    Breast cancer Maternal Aunt 40   Colon cancer Maternal Aunt    Bone cancer Paternal Aunt    Family Psychiatric  History: See previous Social History:  Social History   Substance and Sexual Activity  Alcohol Use Not Currently   Comment: seldom maybe 2-3 times a year     Social History   Substance and Sexual Activity  Drug Use No    Social History   Socioeconomic History   Marital status: Married    Spouse name: Not on file   Number of children: 2   Years of education: Not on file   Highest education level: Some college, no degree  Occupational History   Not on file  Tobacco Use    Smoking status: Every Day    Packs/day: 2.00    Years: 43.00    Total pack years: 86.00    Types: Cigarettes   Smokeless tobacco: Never  Vaping Use   Vaping Use: Never used  Substance and Sexual Activity   Alcohol use: Not Currently    Comment: seldom maybe 2-3 times a year   Drug use: No   Sexual activity: Not Currently  Other Topics Concern   Not on file  Social History Narrative   Not on file   Social Determinants of Health   Financial Resource Strain: Not on file  Food Insecurity: No Food Insecurity (01/02/2023)   Hunger Vital Sign    Worried About Running Out of Food in the Last Year: Never true    Ran Out of Food in the Last Year: Never true  Transportation Needs: No Transportation Needs (01/02/2023)   PRAPARE - Hydrologist (Medical): No    Lack of Transportation (Non-Medical): No  Physical Activity: Not on file  Stress: Not on file  Social Connections: Not on file   Additional Social History:                         Sleep: Fair  Appetite:  Fair  Current Medications: Current Facility-Administered Medications  Medication Dose Route Frequency Provider Last Rate Last Admin  acetaminophen (TYLENOL) tablet 650 mg  650 mg Oral Q6H PRN Patrecia Pour, NP   650 mg at 01/03/23 1226   albuterol (PROVENTIL) (2.5 MG/3ML) 0.083% nebulizer solution 3 mL  3 mL Inhalation Q6H PRN Patrecia Pour, NP       alum & mag hydroxide-simeth (MAALOX/MYLANTA) 200-200-20 MG/5ML suspension 30 mL  30 mL Oral Q4H PRN Patrecia Pour, NP       citalopram (CELEXA) tablet 20 mg  20 mg Oral Daily Patrecia Pour, NP   20 mg at 01/03/23 6283   cyanocobalamin (VITAMIN B12) tablet 1,000 mcg  1,000 mcg Oral Daily Patrecia Pour, NP   1,000 mcg at 01/03/23 6629   divalproex (DEPAKOTE) DR tablet 250 mg  250 mg Oral [id number] Parks Ranger, DO   250 mg at 01/03/23 4765   LORazepam (ATIVAN) tablet 1 mg  1 mg Oral Q4H PRN Parks Ranger, DO        magnesium hydroxide (MILK OF MAGNESIA) suspension 30 mL  30 mL Oral Daily PRN Patrecia Pour, NP       memantine [facility]) tablet 5 mg  5 mg Oral QHS Parks Ranger, DO   5 mg at 01/02/23 2111   nicotine (NICODERM CQ - dosed in mg/24 hours) patch 14 mg  14 mg Transdermal Daily Parks Ranger, DO   14 mg at 01/03/23 0953   OLANZapine (ZYPREXA) tablet 5 mg  5 mg Oral Q6H PRN Parks Ranger, DO       QUEtiapine (SEROQUEL) tablet 100 mg  100 mg Oral QHS Parks Ranger, DO   100 mg at 01/02/23 2111   traZODone (DESYREL) tablet 100 mg  100 mg Oral QHS PRN Parks Ranger, DO        Lab Results: No results found for this or any previous visit (from the past 48 hour(s)).  Blood Alcohol level:  Lab Results  Component Value Date   ETH <10 01/01/2023   ETH <10 46/50/3546    Metabolic Disorder Labs: Lab Results  Component Value Date   HGBA1C 5.4 08/09/2022   MPG 108.28 08/09/2022   No results found for: "PROLACTIN" Lab Results  Component Value Date   CHOL 182 08/09/2022   TRIG 106 08/09/2022   HDL 65 08/09/2022   CHOLHDL 2.8 08/09/2022   VLDL 21 08/09/2022   LDLCALC 96 08/09/2022    Physical Findings: AIMS:  , ,  ,  ,    CIWA:    COWS:     Musculoskeletal: Strength & Muscle Tone: within normal limits Gait & Station: normal Patient leans: N/A  Psychiatric Specialty Exam:  Presentation  General Appearance:  Bizarre  Eye Contact: Good  Speech: Clear and Coherent  Speech Volume: Normal  Handedness:No data recorded  Mood and Affect  Mood: Anxious; Labile; Euphoric  Affect: Congruent   Thought Process  Thought Processes: Disorganized  Descriptions of Associations:Loose  Orientation:Partial  Thought Content:Illogical  History of Schizophrenia/Schizoaffective disorder:No  Duration of Psychotic Symptoms:No data recorded Hallucinations:No data recorded Ideas of Reference:None  Suicidal Thoughts:No data  recorded Homicidal Thoughts:No data recorded  Sensorium  Memory: Immediate Poor; Recent Poor  Judgment: Impaired  Insight: Lacking   Executive Functions  Concentration: Poor  Attention Span: Poor  Recall: Poor  Fund of Knowledge: Poor  Language: Poor   Psychomotor Activity  Psychomotor Activity:No data recorded  Assets  Assets: Resilience; Social Support; Housing; Financial Resources/Insurance   Sleep  Sleep:No data recorded  Physical Exam: Physical Exam Vitals and nursing note reviewed.  Constitutional:      Appearance: Normal appearance.  HENT:     Head: Normocephalic and atraumatic.     Mouth/Throat:     Pharynx: Oropharynx is clear.  Eyes:     Pupils: Pupils are equal, round, and reactive to light.  Cardiovascular:     Rate and Rhythm: Normal rate and regular rhythm.  Pulmonary:     Effort: Pulmonary effort is normal.     Breath sounds: Normal breath sounds.  Abdominal:     General: Abdomen is flat.     Palpations: Abdomen is soft.  Musculoskeletal:        General: Normal range of motion.  Skin:    General: Skin is warm and dry.  Neurological:     General: No focal deficit present.     Mental Status: She is alert. Mental status is at baseline.  Psychiatric:        Attention and Perception: Attention normal.        Mood and Affect: Mood is elated. Affect is labile.        Speech: Speech is rapid and pressured and tangential.        Behavior: Behavior is agitated. Behavior is not aggressive.        Thought Content: Thought content normal.        Cognition and Memory: Memory is impaired.    Review of Systems  Constitutional: Negative.   HENT: Negative.    Eyes: Negative.   Respiratory: Negative.    Cardiovascular: Negative.   Gastrointestinal: Negative.   Musculoskeletal: Negative.   Skin: Negative.   Neurological: Negative.   Psychiatric/Behavioral:  Negative for depression, hallucinations, substance abuse and suicidal ideas.  The patient is nervous/anxious.    Blood pressure 134/85, pulse 84, temperature 98.6 F (37 C), temperature source Oral, resp. rate 18, height '5\' 4"'$  (1.626 m), weight 44.2 kg, SpO2 98 %. Body mass index is 16.74 kg/m.   Treatment Plan Summary: Medication management and Plan no change to medication.  Encourage patient to be up out of bed and interacting with others which she is doing regularly.  Encouraged her to be compliant with medicine.  Alethia Berthold, MD 01/03/2023, 3:06 PM

## 2023-01-03 NOTE — Progress Notes (Signed)
PRN medication given for indigestion / reflux.

## 2023-01-03 NOTE — Progress Notes (Signed)
Dr. Weber Cooks made aware of patient's fall in the ER.

## 2023-01-04 DIAGNOSIS — F3112 Bipolar disorder, current episode manic without psychotic features, moderate: Secondary | ICD-10-CM | POA: Diagnosis not present

## 2023-01-04 NOTE — Progress Notes (Signed)
Patient resting quietly in bed with eyes closed, Respirations equal and unlabored, skin warm and dry, NAD. Routine safety checks conducted according to facility protocol. Will continue to monitor for safety. 

## 2023-01-04 NOTE — [facility] Counselor (Signed)
Adult Comprehensive Assessment  Patient ID: Yvonne Pitts, female   DOB: October 27, 1956, 67 y.o.   MRN: [id number]  Information Source: Information source: Patient  Current Stressors:  Patient states their primary concerns and needs for treatment are:: "I had too much stress from the holidays. I was overwhelmed, tired, exhausted, I had no one to talk to. Was going to leave my husband in the middle of the night" Patient states their goals for this hospitilization and ongoing recovery are:: "To get my butt out of here" Educational / Learning stressors: None reported Employment / Job issues: Pt is retired Family Relationships: Denies Human resources officer / Lack of resources (include bankruptcy): None reported Housing / Lack of housing: None reported Physical health (include injuries & life threatening diseases): scoliosis, athritis, muscle tightness, nerve issues Social relationships: Denies stressor Substance abuse: Denies stressor Bereavement / Loss: None reported  Living/Environment/Situation:  Living Arrangements: Children, Spouse/significant other Living conditions (as described by patient or guardian): "Good" Who else lives in the home?: husband and their son How long has patient lived in current situation?: 71 years What is atmosphere in current home: Loving, Comfortable, Supportive  Family History:  Marital status: Married Number of Years Married: 53 What types of issues is patient dealing with in the relationship?: States she got angry but, states she loves her husband very much Additional relationship information: She shares that they dated two years before they got married. Are you sexually active?:  (Not assessed) What is your sexual orientation?: Heterosexual Has your sexual activity been affected by drugs, alcohol, medication, or emotional stress?: Not assessed Does patient have children?: Yes How many children?: 2 How is patient's relationship with their children?: "I love  my children, you better believe it, and my grandson." Pt has a daughter and 35y.o. son w/ autism  Childhood History:  By whom was/is the patient raised?: Both parents Additional childhood history information: She states that her father was an alcoholic and growing up was "hard". Pt was raised by both parents along with her four siblings. Description of patient's relationship with caregiver when they were a child: "Always loved them but we never had any money." She shares that her parents helped raise her children and almost all of her nieces and nephews as well. Patient's description of current relationship with people who raised him/her: Both parents deceased How were you disciplined when you got in trouble as a child/adolescent?: "Actually I didn't get too much. They raised [place] to do what they said." Does patient have siblings?: Yes Number of Siblings: 4 Description of patient's current relationship with siblings: "I love all my brothers. We're all tight as ticks." Did patient suffer any verbal/emotional/physical/sexual abuse as a child?: No Did patient suffer from severe childhood neglect?: No Has patient ever been sexually abused/assaulted/raped as an adolescent or adult?: No Was the patient ever a victim of a crime or a disaster?: No Witnessed domestic violence?: No Has patient been affected by domestic violence as an adult?: No  Education:  Highest grade of school patient has completed: Some college Currently a Ship broker?: No Learning disability?: No  Employment/Work Situation:   Employment Situation: Retired Social research officer, government has Been Impacted by Current Illness: No What is the Longest Time Patient has Held a Job?: "26 years" Where was the Patient Employed at that Time?: "[employer]" Has Patient ever Been in the [employer]?: No  Financial Resources:   Museum/gallery curator resources: [employer] (Social security) Does patient have a Programmer, applications or guardian?: No  Alcohol/Substance Abuse:  What  has been your use of drugs/alcohol within the last 12 months?: Pt denies any substance use.States she smokes 11 cigarettes a day. If attempted suicide, did drugs/alcohol play a role in this?: No Alcohol/Substance Abuse Treatment Hx: Denies past history Has alcohol/substance abuse ever caused legal problems?: No  Social Support System:   Patient's Community Support System: Good Describe Community Support System: "I got all of my brothers, sister-in-laws, nephews, husband, son, and my brother-in-law" Type of faith/religion: Darrick Meigs How does patient's faith help to cope with current illness?: not assessed  Leisure/Recreation:   Do You Have Hobbies?: Yes Leisure and Hobbies: "Read, watch television, relax"  Strengths/Needs:   What is the patient's perception of their strengths?: "I'm good at loving people and taking care of my family. And I can cook, not as good as my mothere, but I can cook" Patient states they can use these personal strengths during their treatment to contribute to their recovery: Yes Patient states these barriers may affect/interfere with their treatment: Pt denies any Patient states these barriers may affect their return to the community: Pt denies any Other important information patient would like considered in planning for their treatment: N/A  Discharge Plan:   Currently receiving community mental health services: Yes (From Whom) (Has psychiatrist w/ [facility]) Patient states concerns and preferences for aftercare planning are: To continue with current psychiatrist Patient states they will know when they are safe and ready for discharge when: Yes, feels ready now Does patient have access to transportation?: Yes Does patient have financial barriers related to discharge medications?: No Patient description of barriers related to discharge medications: N/A Will patient be returning to same living situation after discharge?: Yes  Summary/Recommendations:   Summary  and Recommendations (to be completed by the evaluator): Yvonne Pitts was admitted due to mania, and not adhering to medication regimen at home. Pt has a hx of bipolar disorder. Recent stressors include being overwhelmed with appointments and having a lot to do over the holiday. Pt currently sees [facility] as outpatient provider. While here, Yvonne Pitts can benefit from crisis stabilization, medication management, therapeutic milieu, and referrals for services.  Yvonne Pitts. 01/04/2023

## 2023-01-04 NOTE — Progress Notes (Signed)
[facility] MD Progress Note  01/04/2023 3:01 PM OLAR SANTINI  MRN:  [id number] Subjective: Follow-up patient with bipolar disorder.  Patient out of her room neatly dressed interacting with peers most of the day.  On interview with me she is still hyperverbal and euphoric but does not seem as scattered or agitated as yesterday.  She is requesting discharge.  Insight is partial at best.  Seems to be tolerating medicine no complaints of any side effects. Principal Problem: Bipolar affective disorder, currently manic, moderate (HCC) Diagnosis: Principal Problem:   Bipolar affective disorder, currently manic, moderate (HCC) Active Problems:   Tobacco abuse   Leukocytosis  Total Time spent with patient: 30 minutes  Past Psychiatric History: Past history of bipolar disorder  Past Medical History:  Past Medical History:  Diagnosis Date   Bipolar 1 disorder ([place])    COPD (chronic obstructive pulmonary disease) ([place]) 11/03/2016   DDD (degenerative disc disease), lumbar    Polycythemia    Ruptured disc, cervical    Scoliosis     Past Surgical History:  Procedure Laterality Date   BREAST EXCISIONAL BIOPSY Right    years ago   DILATION AND CURETTAGE OF UTERUS     NECK SURGERY     Family History:  Family History  Problem Relation Age of Onset   Anxiety disorder Brother    Breast cancer Maternal Aunt 40   Colon cancer Maternal Aunt    Bone cancer Paternal Aunt    Family Psychiatric  History: See previous Social History:  Social History   Substance and Sexual Activity  Alcohol Use Not Currently   Comment: seldom maybe 2-3 times a year     Social History   Substance and Sexual Activity  Drug Use No    Social History   Socioeconomic History   Marital status: Married    Spouse name: Not on file   Number of children: 2   Years of education: Not on file   Highest education level: Some college, no degree  Occupational History   Not on file  Tobacco Use   Smoking status: Every Day     Packs/day: 2.00    Years: 43.00    Total pack years: 86.00    Types: Cigarettes   Smokeless tobacco: Never  Vaping Use   Vaping Use: Never used  Substance and Sexual Activity   Alcohol use: Not Currently    Comment: seldom maybe 2-3 times a year   Drug use: No   Sexual activity: Not Currently  Other Topics Concern   Not on file  Social History Narrative   Not on file   Social Determinants of Health   Financial Resource Strain: Not on file  Food Insecurity: No Food Insecurity (01/02/2023)   Hunger Vital Sign    Worried About Running Out of Food in the Last Year: Never true    Ran Out of Food in the Last Year: Never true  Transportation Needs: No Transportation Needs (01/02/2023)   PRAPARE - Hydrologist (Medical): No    Lack of Transportation (Non-Medical): No  Physical Activity: Not on file  Stress: Not on file  Social Connections: Not on file   Additional Social History:                         Sleep: Fair  Appetite:  Fair  Current Medications: Current Facility-Administered Medications  Medication Dose Route Frequency Provider Last Rate Last Admin  acetaminophen (TYLENOL) tablet 650 mg  650 mg Oral Q6H PRN Patrecia Pour, NP   650 mg at 01/04/23 0530   albuterol (PROVENTIL) (2.5 MG/3ML) 0.083% nebulizer solution 3 mL  3 mL Inhalation Q6H PRN Patrecia Pour, NP       alum & mag hydroxide-simeth (MAALOX/MYLANTA) 200-200-20 MG/5ML suspension 30 mL  30 mL Oral Q4H PRN Patrecia Pour, NP   30 mL at 01/04/23 1044   citalopram (CELEXA) tablet 20 mg  20 mg Oral Daily Patrecia Pour, NP   20 mg at 01/04/23 0900   cyanocobalamin (VITAMIN B12) tablet 1,000 mcg  1,000 mcg Oral Daily Patrecia Pour, NP   1,000 mcg at 01/04/23 0900   divalproex (DEPAKOTE) DR tablet 250 mg  250 mg Oral BH-[id number] Parks Ranger, DO   250 mg at 01/04/23 0900   LORazepam (ATIVAN) tablet 1 mg  1 mg Oral Q4H PRN Parks Ranger, DO        magnesium hydroxide (MILK OF MAGNESIA) suspension 30 mL  30 mL Oral Daily PRN Patrecia Pour, NP       memantine [facility]) tablet 5 mg  5 mg Oral QHS Parks Ranger, DO   5 mg at 01/03/23 2122   nicotine (NICODERM CQ - dosed in mg/24 hours) patch 14 mg  14 mg Transdermal Daily Parks Ranger, DO   14 mg at 01/04/23 1010   OLANZapine (ZYPREXA) tablet 5 mg  5 mg Oral Q6H PRN Parks Ranger, DO       QUEtiapine (SEROQUEL) tablet 100 mg  100 mg Oral QHS Parks Ranger, DO   100 mg at 01/03/23 2123   traZODone (DESYREL) tablet 100 mg  100 mg Oral QHS PRN Parks Ranger, DO        Lab Results: No results found for this or any previous visit (from the past 48 hour(s)).  Blood Alcohol level:  Lab Results  Component Value Date   ETH <10 01/01/2023   ETH <10 06/23/9484    Metabolic Disorder Labs: Lab Results  Component Value Date   HGBA1C 5.4 08/09/2022   MPG 108.28 08/09/2022   No results found for: "PROLACTIN" Lab Results  Component Value Date   CHOL 182 08/09/2022   TRIG 106 08/09/2022   HDL 65 08/09/2022   CHOLHDL 2.8 08/09/2022   VLDL 21 08/09/2022   LDLCALC 96 08/09/2022    Physical Findings: AIMS:  , ,  ,  ,    CIWA:    COWS:     Musculoskeletal: Strength & Muscle Tone: within normal limits Gait & Station: normal Patient leans: N/A  Psychiatric Specialty Exam:  Presentation  General Appearance:  Bizarre  Eye Contact: Good  Speech: Clear and Coherent  Speech Volume: Normal  Handedness:No data recorded  Mood and Affect  Mood: Anxious; Labile; Euphoric  Affect: Congruent   Thought Process  Thought Processes: Disorganized  Descriptions of Associations:Loose  Orientation:Partial  Thought Content:Illogical  History of Schizophrenia/Schizoaffective disorder:No  Duration of Psychotic Symptoms:No data recorded Hallucinations:No data recorded Ideas of Reference:None  Suicidal Thoughts:No data  recorded Homicidal Thoughts:No data recorded  Sensorium  Memory: Immediate Poor; Recent Poor  Judgment: Impaired  Insight: Lacking   Executive Functions  Concentration: Poor  Attention Span: Poor  Recall: Poor  Fund of Knowledge: Poor  Language: Poor   Psychomotor Activity  Psychomotor Activity:No data recorded  Assets  Assets: Resilience; Social Support; Housing; Financial Resources/Insurance   Sleep  Sleep:No  data recorded   Physical Exam: Physical Exam Vitals and nursing note reviewed.  Constitutional:      Appearance: Normal appearance.  HENT:     Head: Normocephalic and atraumatic.     Mouth/Throat:     Pharynx: Oropharynx is clear.  Eyes:     Pupils: Pupils are equal, round, and reactive to light.  Cardiovascular:     Rate and Rhythm: Normal rate and regular rhythm.  Pulmonary:     Effort: Pulmonary effort is normal.     Breath sounds: Normal breath sounds.  Abdominal:     General: Abdomen is flat.     Palpations: Abdomen is soft.  Musculoskeletal:        General: Normal range of motion.  Skin:    General: Skin is warm and dry.  Neurological:     General: No focal deficit present.     Mental Status: She is alert. Mental status is at baseline.  Psychiatric:        Attention and Perception: Attention normal.        Mood and Affect: Mood is elated. Affect is labile.        Speech: Speech is tangential.        Behavior: Behavior is cooperative.        Thought Content: Thought content normal.        Cognition and Memory: Cognition normal.    Review of Systems  Constitutional: Negative.   HENT: Negative.    Eyes: Negative.   Respiratory: Negative.    Cardiovascular: Negative.   Gastrointestinal: Negative.   Musculoskeletal: Negative.   Skin: Negative.   Neurological: Negative.   Psychiatric/Behavioral: Negative.     Blood pressure 136/64, pulse 73, temperature 98.7 F (37.1 C), temperature source Oral, resp. rate 18, height 5'  4" (1.626 m), weight 44.2 kg, SpO2 97 %. Body mass index is 16.74 kg/m.   Treatment Plan Summary: Medication management and Plan no change to medication.  Vital stable.  No new labs.  Engaged appropriately in treatment on the unit.  Encouraged her to talk with primary team tomorrow.  Alethia Berthold, MD 01/04/2023, 3:01 PM

## 2023-01-04 NOTE — Plan of Care (Signed)
Patient denies SI/ HI / AVH. Pt is adherent with scheduled medications. No signs of distress or injury.    Problem: Education: Goal: Knowledge of General Education information will improve Description: Including pain rating scale, medication(s)/side effects and non-pharmacologic comfort measures Outcome: Not Progressing   Problem: Health Behavior/Discharge Planning: Goal: Ability to manage health-related needs will improve Outcome: Not Progressing   Problem: Clinical Measurements: Goal: Ability to maintain clinical measurements within normal limits will improve Outcome: Not Progressing

## 2023-01-05 DIAGNOSIS — F3112 Bipolar disorder, current episode manic without psychotic features, moderate: Secondary | ICD-10-CM | POA: Diagnosis not present

## 2023-01-05 MED ORDER — AMLODIPINE BESYLATE 5 MG PO TABS
5.0000 mg | ORAL_TABLET | Freq: Every day | ORAL | Status: DC
  Administered 2023-01-05 – 2023-01-07 (×3): 5 mg via ORAL
  Filled 2023-01-05 (×3): qty 1

## 2023-01-05 NOTE — BHH Suicide Risk Assessment (Signed)
[place] INPATIENT:  Family/Significant Other Suicide Prevention Education  Suicide Prevention Education:  Education Completed; Oliveah Zwack,  spouse, [phone], has been identified by the patient as the family member/significant other with whom the patient will be residing, and identified as the person(s) who will aid the patient in the event of a mental health crisis (suicidal ideations/suicide attempt).  With written consent from the patient, the family member/significant other has been provided the following suicide prevention education, prior to the and/or following the discharge of the patient.  The suicide prevention education provided includes the following: Suicide risk factors Suicide prevention and interventions National Suicide Hotline telephone number [facility] assessment telephone number [facility] Emergency Assistance [place] and/or Residential Mobile Crisis Unit telephone number  Request made of family/significant other to: Remove weapons (e.g., guns, rifles, knives), all items previously/currently identified as safety concern.   Remove drugs/medications (over-the-counter, prescriptions, illicit drugs), all items previously/currently identified as a safety concern.  The family member/significant other verbalizes understanding of the suicide prevention education information provided.  The family member/significant other agrees to remove the items of safety concern listed above.  He said that pt stopped taking her medication and that pt "likes to play doctor." He stated that pt is not a harm to herself or others but she is "the worse she's ever been." He said pt is able to come home and that pt has outpatient psychiatry follow up appointment scheduled.   Jaree Dwight A [place] 01/05/2023, 1:23 PM

## 2023-01-05 NOTE — Progress Notes (Signed)
   01/05/23 0721  Psych Admission Type (Psych Patients Only)  Admission Status Involuntary  Psychosocial Assessment  Patient Complaints None  Eye Contact Fair  Facial Expression Animated;Fixed smile  Affect Appropriate to circumstance  Speech Logical/coherent  Interaction Assertive  Motor Activity Slow  Appearance/Hygiene Unremarkable  Behavior Characteristics Cooperative  Mood Pleasant  Thought Process  Coherency WDL  Content WDL  Delusions None reported or observed  Perception WDL  Hallucination None reported or observed  Judgment Impaired  Confusion None  Danger to Self  Current suicidal ideation? Denies  Danger to Others  Danger to Others None reported or observed

## 2023-01-05 NOTE — Progress Notes (Signed)
Patient attended seated exercise group and actively participated in different exercises. 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

## 2023-01-05 NOTE — Group Note (Signed)
LCSW Group Therapy Note  Group Date: 01/05/2023 Start Time: 0263 End Time: 1630   Type of Therapy and Topic:  Group Therapy - Healthy vs Unhealthy Coping Skills  Participation Level:  Active   Description of Group The focus of this group was to determine what unhealthy coping techniques typically are used by group members and what healthy coping techniques would be helpful in coping with various problems. Patients were guided in becoming aware of the differences between healthy and unhealthy coping techniques. Patients were asked to identify 2-3 healthy coping skills they would like to learn to use more effectively.  Therapeutic Goals Patients learned that coping is what human beings do all day long to deal with various situations in their lives Patients defined and discussed healthy vs unhealthy coping techniques Patients identified their preferred coping techniques and identified whether these were healthy or unhealthy Patients determined 2-3 healthy coping skills they would like to become more familiar with and use more often. Patients provided support and ideas to each other   Summary of Patient Progress:  During group, she expressed that she can "only have control of what she can control." She stated that when she was sick she learned to rely on others until she was well enough but stated she still overworked her self during her recovery. Patient proved open to input from peers and feedback from [place]. Patient demonstrated fair insight into the subject matter, was respectful of peers, and participated throughout the entire session.   Therapeutic Modalities Cognitive Behavioral Therapy Motivational Interviewing  Xzavian Semmel A [place], LCSWA 01/05/2023  4:42 PM

## 2023-01-05 NOTE — BH IP Treatment Plan (Signed)
Interdisciplinary Treatment and Diagnostic Plan Update  01/05/2023 Time of Session: 9:00AM EDITHE DOBBIN MRN: [id number]  Principal Diagnosis: Bipolar affective disorder, currently manic, moderate (HCC)  Secondary Diagnoses: Principal Problem:   Bipolar affective disorder, currently manic, moderate (HCC) Active Problems:   Tobacco abuse   Leukocytosis   Current Medications:  Current Facility-Administered Medications  Medication Dose Route Frequency Provider Last Rate Last Admin   acetaminophen (TYLENOL) tablet 650 mg  650 mg Oral Q6H PRN Patrecia Pour, NP   650 mg at 01/05/23 0502   albuterol (PROVENTIL) (2.5 MG/3ML) 0.083% nebulizer solution 3 mL  3 mL Inhalation Q6H PRN Patrecia Pour, NP       alum & mag hydroxide-simeth (MAALOX/MYLANTA) 200-200-20 MG/5ML suspension 30 mL  30 mL Oral Q4H PRN Patrecia Pour, NP   30 mL at 01/04/23 1044   citalopram (CELEXA) tablet 20 mg  20 mg Oral Daily Patrecia Pour, NP   20 mg at 01/05/23 6546   cyanocobalamin (VITAMIN B12) tablet 1,000 mcg  1,000 mcg Oral Daily Patrecia Pour, NP   1,000 mcg at 01/05/23 0851   divalproex (DEPAKOTE) DR tablet 250 mg  250 mg Oral BH-q8a4p Parks Ranger, DO   250 mg at 01/05/23 5035   LORazepam (ATIVAN) tablet 1 mg  1 mg Oral Q4H PRN Parks Ranger, DO   1 mg at 01/05/23 4656   magnesium hydroxide (MILK OF MAGNESIA) suspension 30 mL  30 mL Oral Daily PRN Patrecia Pour, NP       memantine [facility]) tablet 5 mg  5 mg Oral QHS Parks Ranger, DO   5 mg at 01/04/23 2120   nicotine (NICODERM CQ - dosed in mg/24 hours) patch 14 mg  14 mg Transdermal Daily Parks Ranger, DO   14 mg at 01/05/23 0901   OLANZapine (ZYPREXA) tablet 5 mg  5 mg Oral Q6H PRN Parks Ranger, DO       QUEtiapine (SEROQUEL) tablet 100 mg  100 mg Oral QHS Parks Ranger, DO   100 mg at 01/04/23 2120   traZODone (DESYREL) tablet 100 mg  100 mg Oral QHS PRN Parks Ranger, DO        PTA Medications: Medications Prior to Admission  Medication Sig Dispense Refill Last Dose   Albuterol Sulfate (PROAIR RESPICLICK) 812 (90 Base) MCG/ACT AEPB Inhale 2 puffs into the lungs every 6 (six) hours as needed. 2 each 3    alendronate (FOSAMAX) 70 MG tablet Take 70 mg by mouth once a week. Take with a full glass of water on an empty stomach.      amLODipine (NORVASC) 2.5 MG tablet Take 2.5 mg by mouth daily.      budesonide-formoterol (SYMBICORT) 160-4.5 MCG/ACT inhaler Inhale 2 puffs into the lungs 2 (two) times daily.      citalopram (CELEXA) 20 MG tablet Take 1 tablet (20 mg total) by mouth daily. 30 tablet 3    cyanocobalamin 1000 MCG tablet Take 1 tablet (1,000 mcg total) by mouth daily. 30 tablet 3    lamoTRIgine (LAMICTAL) 25 MG tablet Take 25 mg by mouth 2 (two) times daily.      omeprazole (PRILOSEC) 20 MG capsule Take 20 mg by mouth daily.      QUEtiapine (SEROQUEL) 100 MG tablet Take 1 tablet (100 mg total) by mouth at bedtime. 30 tablet 1    QUEtiapine (SEROQUEL) 50 MG tablet Take 3 tablets (150 mg total) by mouth  at bedtime. (Patient not taking: Reported on 01/01/2023) 90 tablet 3    terbinafine (LAMISIL) 250 MG tablet Take 250 mg by mouth daily.       Patient Stressors: Marital or family conflict   Medication change or noncompliance    Patient Strengths: Motivation for treatment/growth  Supportive family/friends   Treatment Modalities: Medication Management, Group therapy, Case management,  1 to 1 session with clinician, Psychoeducation, Recreational therapy.   Physician Treatment Plan for Primary Diagnosis: Bipolar affective disorder, currently manic, moderate ([place]) Long Term Goal(s): Improvement in symptoms so as ready for discharge   Short Term Goals: Ability to identify changes in lifestyle to reduce recurrence of condition will improve Ability to verbalize feelings will improve Ability to disclose and discuss suicidal ideas Ability to demonstrate  self-control will improve Ability to identify and develop effective coping behaviors will improve Ability to maintain clinical measurements within normal limits will improve Compliance with prescribed medications will improve Ability to identify triggers associated with substance abuse/mental health issues will improve  Medication Management: Evaluate patient's response, side effects, and tolerance of medication regimen.  Therapeutic Interventions: 1 to 1 sessions, Unit Group sessions and Medication administration.  Evaluation of Outcomes: Not Met  Physician Treatment Plan for Secondary Diagnosis: Principal Problem:   Bipolar affective disorder, currently manic, moderate (HCC) Active Problems:   Tobacco abuse   Leukocytosis  Long Term Goal(s): Improvement in symptoms so as ready for discharge   Short Term Goals: Ability to identify changes in lifestyle to reduce recurrence of condition will improve Ability to verbalize feelings will improve Ability to disclose and discuss suicidal ideas Ability to demonstrate self-control will improve Ability to identify and develop effective coping behaviors will improve Ability to maintain clinical measurements within normal limits will improve Compliance with prescribed medications will improve Ability to identify triggers associated with substance abuse/mental health issues will improve     Medication Management: Evaluate patient's response, side effects, and tolerance of medication regimen.  Therapeutic Interventions: 1 to 1 sessions, Unit Group sessions and Medication administration.  Evaluation of Outcomes: Not Met   RN Treatment Plan for Primary Diagnosis: Bipolar affective disorder, currently manic, moderate ([place]) Long Term Goal(s): Knowledge of disease and therapeutic regimen to maintain health will improve  Short Term Goals: Ability to remain free from injury will improve, Ability to verbalize frustration and anger appropriately will  improve, Ability to demonstrate self-control, Ability to participate in decision making will improve, Ability to verbalize feelings will improve, Ability to identify and develop effective coping behaviors will improve, and Compliance with prescribed medications will improve  Medication Management: RN will administer medications as ordered by provider, will assess and evaluate patient's response and provide education to patient for prescribed medication. RN will report any adverse and/or side effects to prescribing provider.  Therapeutic Interventions: 1 on 1 counseling sessions, Psychoeducation, Medication administration, Evaluate responses to treatment, Monitor vital signs and CBGs as ordered, Perform/monitor CIWA, COWS, AIMS and Fall Risk screenings as ordered, Perform wound care treatments as ordered.  Evaluation of Outcomes: Not Met   LCSW Treatment Plan for Primary Diagnosis: Bipolar affective disorder, currently manic, moderate ([place]) Long Term Goal(s): Safe transition to appropriate next level of care at discharge, Engage patient in therapeutic group addressing interpersonal concerns.  Short Term Goals: Engage patient in aftercare planning with referrals and resources, Increase social support, Increase ability to appropriately verbalize feelings, Increase emotional regulation, Facilitate acceptance of mental health diagnosis and concerns, and Increase skills for wellness and recovery  Therapeutic Interventions: Assess for all discharge needs, 1 to 1 time with Education officer, museum, Explore available resources and support systems, Assess for adequacy in community support network, Educate family and significant other(s) on suicide prevention, Complete Psychosocial Assessment, Interpersonal group therapy.  Evaluation of Outcomes: Not Met   Progress in Treatment: Attending groups: No. Participating in groups: No. Taking medication as prescribed: Yes. Toleration medication: Yes. Family/Significant  other contact made: No, will contact:  when given permission Patient understands diagnosis: No. Discussing patient identified problems/goals with staff: Yes. Medical problems stabilized or resolved: No. Denies suicidal/homicidal ideation: Yes. Issues/concerns per patient self-inventory: No. Other: None  New problem(s) identified: No, Describe:  None  New Short Term/Long Term Goal(s): Patient to work towards  elimination of symptoms of psychosis, medication management for mood stabilization; development of comprehensive mental wellness plan.   Patient Goals:  "my other meds were killing me"  Discharge Plan or Barriers: CSW will assist pt with development of comprehensive discharge/aftercare plan.   Reason for Continuation of Hospitalization: Delusions  Mania Medication stabilization  Estimated Length of Stay: 1-7 days  Last 3 [place] Suicide Severity Risk Score: [place] Admission (Current) from 01/02/2023 in [place] ED from 01/01/2023 in [place] Admission (Discharged) from 08/08/2022 in [place] No Risk No Risk No Risk       Last PHQ 2/9 Scores:    12/16/2022   12:20 PM 10/23/2022   10:58 AM 08/28/2022   10:53 AM  Depression screen PHQ 2/9  Decreased Interest 1 0 0  Down, Depressed, Hopeless 1 0 0  PHQ - 2 Score 2 0 0  Altered sleeping 1    Tired, decreased energy 1    Change in appetite 0    Feeling bad or failure about yourself  1    Trouble concentrating 1    Moving slowly or fidgety/restless 1    Suicidal thoughts 0    PHQ-9 Score 7      Scribe for Treatment Team: Braelyn Jenson A [place], [place] 01/05/2023 10:28 AM

## 2023-01-05 NOTE — Progress Notes (Signed)
   01/05/23 0500  Psych Admission Type (Psych Patients Only)  Admission Status Involuntary  Psychosocial Assessment  Patient Complaints None  Eye Contact Fair  Facial Expression Animated  Affect Appropriate to circumstance  Speech Logical/coherent  Interaction Assertive  Motor Activity Slow  Appearance/Hygiene Unremarkable  Behavior Characteristics Cooperative;Appropriate to situation;Calm  Mood Pleasant  Thought Process  Coherency WDL  Content WDL  Delusions None reported or observed  Perception WDL  Hallucination None reported or observed  Judgment Impaired  Confusion None  Danger to Self  Current suicidal ideation? Denies  Danger to Others  Danger to Others None reported or observed

## 2023-01-05 NOTE — Progress Notes (Signed)
[facility] MD Progress Note  01/05/2023 10:37 AM Yvonne Pitts  MRN:  [id number] Subjective: Yvonne Pitts is seen on rounds.  She is doing a little bit better on Depakote.  It has been titrated up to 250 mg twice a day.  She is in a good mood and semihypomanic.  She is also on Namenda and her Seroquel was restarted.  She states that she is fine with her current medications and denies any side effects.  Principal Problem: Bipolar affective disorder, currently manic, moderate (HCC) Diagnosis: Principal Problem:   Bipolar affective disorder, currently manic, moderate (HCC) Active Problems:   Tobacco abuse   Leukocytosis  Total Time spent with patient: 15 minutes  Past Psychiatric History: She sees Dr. Modesta Messing outpatient.  This is her second psychiatric admission for mania.  Apparently, she has some memory problems too.  Past Medical History:  Past Medical History:  Diagnosis Date   Bipolar 1 disorder ([place])    COPD (chronic obstructive pulmonary disease) ([place]) 11/03/2016   DDD (degenerative disc disease), lumbar    Polycythemia    Ruptured disc, cervical    Scoliosis     Past Surgical History:  Procedure Laterality Date   BREAST EXCISIONAL BIOPSY Right    years ago   DILATION AND CURETTAGE OF UTERUS     NECK SURGERY     Family History:  Family History  Problem Relation Age of Onset   Anxiety disorder Brother    Breast cancer Maternal Aunt 40   Colon cancer Maternal Aunt    Bone cancer Paternal Aunt    Family Psychiatric  History: Unremarkable Social History:  Social History   Substance and Sexual Activity  Alcohol Use Not Currently   Comment: seldom maybe 2-3 times a year     Social History   Substance and Sexual Activity  Drug Use No    Social History   Socioeconomic History   Marital status: Married    Spouse name: Not on file   Number of children: 2   Years of education: Not on file   Highest education level: Some college, no degree  Occupational History   Not on file   Tobacco Use   Smoking status: Every Day    Packs/day: 2.00    Years: 43.00    Total pack years: 86.00    Types: Cigarettes   Smokeless tobacco: Never  Vaping Use   Vaping Use: Never used  Substance and Sexual Activity   Alcohol use: Not Currently    Comment: seldom maybe 2-3 times a year   Drug use: No   Sexual activity: Not Currently  Other Topics Concern   Not on file  Social History Narrative   Not on file   Social Determinants of Health   Financial Resource Strain: Not on file  Food Insecurity: No Food Insecurity (01/02/2023)   Hunger Vital Sign    Worried About Running Out of Food in the Last Year: Never true    Ran Out of Food in the Last Year: Never true  Transportation Needs: No Transportation Needs (01/02/2023)   PRAPARE - Hydrologist (Medical): No    Lack of Transportation (Non-Medical): No  Physical Activity: Not on file  Stress: Not on file  Social Connections: Not on file   Additional Social History:                         Sleep: Fair  Appetite:  Fair  Current Medications: Current Facility-Administered Medications  Medication Dose Route Frequency Provider Last Rate Last Admin   acetaminophen (TYLENOL) tablet 650 mg  650 mg Oral Q6H PRN Patrecia Pour, NP   650 mg at 01/05/23 0502   albuterol (PROVENTIL) (2.5 MG/3ML) 0.083% nebulizer solution 3 mL  3 mL Inhalation Q6H PRN Patrecia Pour, NP       alum & mag hydroxide-simeth (MAALOX/MYLANTA) 200-200-20 MG/5ML suspension 30 mL  30 mL Oral Q4H PRN Patrecia Pour, NP   30 mL at 01/04/23 1044   citalopram (CELEXA) tablet 20 mg  20 mg Oral Daily Patrecia Pour, NP   20 mg at 01/05/23 5681   cyanocobalamin (VITAMIN B12) tablet 1,000 mcg  1,000 mcg Oral Daily Patrecia Pour, NP   1,000 mcg at 01/05/23 0851   divalproex (DEPAKOTE) DR tablet 250 mg  250 mg Oral [id number] Parks Ranger, DO   250 mg at 01/05/23 2751   LORazepam (ATIVAN) tablet 1 mg  1 mg Oral Q4H  PRN Parks Ranger, DO   1 mg at 01/05/23 7001   magnesium hydroxide (MILK OF MAGNESIA) suspension 30 mL  30 mL Oral Daily PRN Patrecia Pour, NP       memantine [facility]) tablet 5 mg  5 mg Oral QHS Parks Ranger, DO   5 mg at 01/04/23 2120   nicotine (NICODERM CQ - dosed in mg/24 hours) patch 14 mg  14 mg Transdermal Daily Parks Ranger, DO   14 mg at 01/05/23 0901   OLANZapine (ZYPREXA) tablet 5 mg  5 mg Oral Q6H PRN Parks Ranger, DO       QUEtiapine (SEROQUEL) tablet 100 mg  100 mg Oral QHS Parks Ranger, DO   100 mg at 01/04/23 2120   traZODone (DESYREL) tablet 100 mg  100 mg Oral QHS PRN Parks Ranger, DO        Lab Results: No results found for this or any previous visit (from the past 48 hour(s)).  Blood Alcohol level:  Lab Results  Component Value Date   ETH <10 01/01/2023   ETH <10 74/94/4967    Metabolic Disorder Labs: Lab Results  Component Value Date   HGBA1C 5.4 08/09/2022   MPG 108.28 08/09/2022   No results found for: "PROLACTIN" Lab Results  Component Value Date   CHOL 182 08/09/2022   TRIG 106 08/09/2022   HDL 65 08/09/2022   CHOLHDL 2.8 08/09/2022   VLDL 21 08/09/2022   LDLCALC 96 08/09/2022    Physical Findings: AIMS:  , ,  ,  ,    CIWA:    COWS:     Musculoskeletal: Strength & Muscle Tone: within normal limits Gait & Station: normal Patient leans: N/A  Psychiatric Specialty Exam:  Presentation  General Appearance:  Bizarre  Eye Contact: Good  Speech: Clear and Coherent  Speech Volume: Normal  Handedness:No data recorded  Mood and Affect  Mood: Anxious; Labile; Euphoric  Affect: Congruent   Thought Process  Thought Processes: Disorganized  Descriptions of Associations:Loose  Orientation:Partial  Thought Content:Illogical  History of Schizophrenia/Schizoaffective disorder:No  Duration of Psychotic Symptoms:No data recorded Hallucinations:No data  recorded Ideas of Reference:None  Suicidal Thoughts:No data recorded Homicidal Thoughts:No data recorded  Sensorium  Memory: Immediate Poor; Recent Poor  Judgment: Impaired  Insight: Lacking   Executive Functions  Concentration: Poor  Attention Span: Poor  Recall: Poor  Fund of Knowledge: Poor  Language: Poor   Psychomotor Activity  Psychomotor Activity:No data recorded  Assets  Assets: Resilience; Social Support; Housing; Financial Resources/Insurance   Sleep  Sleep:No data recorded   Physical Exam: Physical Exam Vitals and nursing note reviewed.  Constitutional:      Appearance: Normal appearance. She is normal weight.  Neurological:     General: No focal deficit present.     Mental Status: She is alert and oriented to person, place, and time.  Psychiatric:        Attention and Perception: Attention and perception normal.        Mood and Affect: Mood and affect normal.        Speech: Speech normal.        Behavior: Behavior normal. Behavior is cooperative.        Thought Content: Thought content normal.        Cognition and Memory: Cognition and memory normal.        Judgment: Judgment normal.    Review of Systems  Constitutional: Negative.   HENT: Negative.    Eyes: Negative.   Respiratory: Negative.    Cardiovascular: Negative.   Gastrointestinal: Negative.   Genitourinary: Negative.   Musculoskeletal: Negative.   Skin: Negative.   Neurological: Negative.   Endo/Heme/Allergies: Negative.   Psychiatric/Behavioral:  The patient is nervous/anxious.    Blood pressure (!) 141/67, pulse 66, temperature 98 F (36.7 C), temperature source Oral, resp. rate 16, height '5\' 4"'$  (1.626 m), weight 44.2 kg, SpO2 98 %. Body mass index is 16.74 kg/m.   Treatment Plan Summary: Daily contact with patient to assess and evaluate symptoms and progress in treatment, Medication management, and Plan continue current medications.  Restart  Norvasc.  Parks Ranger, DO 01/05/2023, 10:37 AM

## 2023-01-05 NOTE — Progress Notes (Signed)
Patient is alert and oriented times 4. Mood and affect appropriate. Patient denies pain. She denies SI, HI, and AVH. Also denies feelings of anxiety and depression at this time. States she slept good last night. Morning meds given whole by mouth W/O difficulty. Patient is manic, talkative, smiling, and stating she loves everyone. Ate breakfast in day room- appetite good. Patient remains on unit with Q15 minute checks in place.

## 2023-01-05 NOTE — Plan of Care (Signed)
  Problem: Education: Goal: Knowledge of General Education information will improve Description: Including pain rating scale, medication(s)/side effects and non-pharmacologic comfort measures Outcome: Progressing   Problem: Health Behavior/Discharge Planning: Goal: Ability to manage health-related needs will improve Outcome: Progressing   Problem: Clinical Measurements: Goal: Ability to maintain clinical measurements within normal limits will improve Outcome: Progressing Goal: Will remain free from infection Outcome: Progressing Goal: Diagnostic test results will improve Outcome: Progressing Goal: Respiratory complications will improve Outcome: Progressing Goal: Cardiovascular complication will be avoided Outcome: Progressing   Problem: Activity: Goal: Risk for activity intolerance will decrease Outcome: Progressing   Problem: Nutrition: Goal: Adequate nutrition will be maintained Outcome: Progressing   Problem: Coping: Goal: Level of anxiety will decrease Outcome: Progressing   Problem: Elimination: Goal: Will not experience complications related to bowel motility Outcome: Progressing Goal: Will not experience complications related to urinary retention Outcome: Progressing   Problem: Pain Managment: Goal: General experience of comfort will improve Outcome: Progressing   Problem: Safety: Goal: Ability to remain free from injury will improve Outcome: Progressing   Problem: Skin Integrity: Goal: Risk for impaired skin integrity will decrease Outcome: Progressing   Problem: Education: Goal: Knowledge of [place] [facility] Education information/materials will improve Outcome: Progressing Goal: Emotional status will improve Outcome: Progressing Goal: Mental status will improve Outcome: Progressing Goal: Verbalization of understanding the information provided will improve Outcome: Progressing   Problem: Activity: Goal: Interest or engagement in activities will  improve Outcome: Progressing Goal: Sleeping patterns will improve Outcome: Progressing   Problem: Coping: Goal: Ability to verbalize frustrations and anger appropriately will improve Outcome: Progressing Goal: Ability to demonstrate self-control will improve Outcome: Progressing   Problem: Health Behavior/Discharge Planning: Goal: Identification of resources available to assist in meeting health care needs will improve Outcome: Progressing Goal: Compliance with treatment plan for underlying cause of condition will improve Outcome: Progressing   Problem: Physical Regulation: Goal: Ability to maintain clinical measurements within normal limits will improve Outcome: Progressing   Problem: Safety: Goal: Periods of time without injury will increase Outcome: Progressing   Problem: Education: Goal: Ability to state activities that reduce stress will improve Outcome: Progressing   Problem: Coping: Goal: Ability to identify and develop effective coping behavior will improve Outcome: Progressing   Problem: Self-Concept: Goal: Ability to identify factors that promote anxiety will improve Outcome: Progressing Goal: Level of anxiety will decrease Outcome: Progressing Goal: Ability to modify response to factors that promote anxiety will improve Outcome: Progressing

## 2023-01-06 DIAGNOSIS — F3112 Bipolar disorder, current episode manic without psychotic features, moderate: Secondary | ICD-10-CM | POA: Diagnosis not present

## 2023-01-06 MED ORDER — QUETIAPINE FUMARATE 25 MG PO TABS
150.0000 mg | ORAL_TABLET | Freq: Every day | ORAL | Status: DC
  Administered 2023-01-06 – 2023-01-08 (×3): 150 mg via ORAL
  Filled 2023-01-06 (×3): qty 2

## 2023-01-06 NOTE — Progress Notes (Signed)
   01/05/23 2220  Psych Admission Type (Psych Patients Only)  Admission Status Involuntary  Psychosocial Assessment  Patient Complaints None  Eye Contact Fair  Facial Expression Animated;Fixed smile  Affect Appropriate to circumstance  Speech Logical/coherent  Interaction Assertive  Motor Activity Slow;Pacing;Restless  Appearance/Hygiene Unremarkable  Behavior Characteristics Cooperative;Appropriate to situation  Mood Pleasant  Thought Process  Coherency WDL  Content WDL  Delusions None reported or observed  Perception WDL  Hallucination None reported or observed  Judgment Impaired  Confusion None  Danger to Self  Current suicidal ideation? Denies  Danger to Others  Danger to Others None reported or observed

## 2023-01-06 NOTE — Progress Notes (Signed)
   01/06/23 2300  Psych Admission Type (Psych Patients Only)  Admission Status Involuntary  Psychosocial Assessment  Patient Complaints None  Eye Contact Fair  Facial Expression Animated  Affect Appropriate to circumstance  Speech Logical/coherent  Interaction Assertive;Intrusive  Motor Activity Restless  Appearance/Hygiene Unremarkable  Behavior Characteristics Cooperative  Mood Pleasant  Thought Process  Coherency WDL  Content WDL  Delusions None reported or observed  Perception WDL  Hallucination None reported or observed  Judgment Impaired  Confusion None  Danger to Self  Current suicidal ideation? Denies  Danger to Others  Danger to Others None reported or observed

## 2023-01-06 NOTE — Plan of Care (Signed)
  Problem: Education: Goal: Knowledge of General Education information will improve Description: Including pain rating scale, medication(s)/side effects and non-pharmacologic comfort measures Outcome: Progressing   Problem: Health Behavior/Discharge Planning: Goal: Ability to manage health-related needs will improve Outcome: Progressing   Problem: Clinical Measurements: Goal: Ability to maintain clinical measurements within normal limits will improve Outcome: Progressing Goal: Will remain free from infection Outcome: Progressing Goal: Diagnostic test results will improve Outcome: Progressing Goal: Respiratory complications will improve Outcome: Progressing Goal: Cardiovascular complication will be avoided Outcome: Progressing   Problem: Activity: Goal: Risk for activity intolerance will decrease Outcome: Progressing   Problem: Nutrition: Goal: Adequate nutrition will be maintained Outcome: Progressing   Problem: Coping: Goal: Level of anxiety will decrease Outcome: Progressing   Problem: Elimination: Goal: Will not experience complications related to bowel motility Outcome: Progressing Goal: Will not experience complications related to urinary retention Outcome: Progressing   Problem: Pain Managment: Goal: General experience of comfort will improve Outcome: Progressing   Problem: Safety: Goal: Ability to remain free from injury will improve Outcome: Progressing   Problem: Skin Integrity: Goal: Risk for impaired skin integrity will decrease Outcome: Progressing   Problem: Education: Goal: Knowledge of [place] [facility] Education information/materials will improve Outcome: Progressing Goal: Emotional status will improve Outcome: Progressing Goal: Mental status will improve Outcome: Progressing Goal: Verbalization of understanding the information provided will improve Outcome: Progressing   Problem: Activity: Goal: Interest or engagement in activities will  improve Outcome: Progressing Goal: Sleeping patterns will improve Outcome: Progressing   Problem: Health Behavior/Discharge Planning: Goal: Identification of resources available to assist in meeting health care needs will improve Outcome: Progressing Goal: Compliance with treatment plan for underlying cause of condition will improve Outcome: Progressing   Problem: Physical Regulation: Goal: Ability to maintain clinical measurements within normal limits will improve Outcome: Progressing   Problem: Coping: Goal: Ability to identify and develop effective coping behavior will improve Outcome: Progressing   Problem: Self-Concept: Goal: Ability to identify factors that promote anxiety will improve Outcome: Progressing Goal: Level of anxiety will decrease Outcome: Progressing Goal: Ability to modify response to factors that promote anxiety will improve Outcome: Progressing

## 2023-01-06 NOTE — Group Note (Signed)
[facility] LCSW Group Therapy Note   Group Date: 01/06/2023 Start Time: 1330 End Time: 1355   Type of Therapy/Topic:  Group Therapy:  Emotion Regulation  Participation Level:  Active   Mood:  Description of Group:    The purpose of this group is to assist patients in learning to regulate negative emotions and experience positive emotions. Patients will be guided to discuss ways in which they have been vulnerable to their negative emotions. These vulnerabilities will be juxtaposed with experiences of positive emotions or situations, and patients challenged to use positive emotions to combat negative ones. Special emphasis will be placed on coping with negative emotions in conflict situations, and patients will process healthy conflict resolution skills.  Therapeutic Goals: Patient will identify two positive emotions or experiences to reflect on in order to balance out negative emotions:  Patient will label two or more emotions that they find the most difficult to experience:  Patient will be able to demonstrate positive conflict resolution skills through discussion or role plays:   Summary of Patient Progress:   Patient was present for the entirety of the group session. Patient was an active listener and participated in the topic of discussion, provided helpful advice to others, and added nuance to topic of conversation. She said her favorite rainy day activity was "shopping" and that she would never be great at emotion regulation "because their is always something to learn." Group ended early due to unit disruption from [place] and patient needing to be moved to a safer area.      Therapeutic Modalities:   Cognitive Behavioral Therapy Feelings Identification Dialectical Behavioral Therapy   [place] Dost A [place], LCSWA

## 2023-01-06 NOTE — Progress Notes (Signed)
   01/06/23 0737  Psych Admission Type (Psych Patients Only)  Admission Status Involuntary  Psychosocial Assessment  Patient Complaints None  Eye Contact Fair  Facial Expression Animated;Fixed smile  Affect Appropriate to circumstance  Speech Logical/coherent  Interaction Assertive;Intrusive  Motor Activity Restless  Appearance/Hygiene Unremarkable  Behavior Characteristics Cooperative  Mood Pleasant  Thought Process  Coherency WDL  Content WDL  Delusions None reported or observed  Perception WDL  Hallucination None reported or observed  Judgment Impaired  Confusion None  Danger to Self  Current suicidal ideation? Denies  Danger to Others  Danger to Others None reported or observed

## 2023-01-06 NOTE — Progress Notes (Signed)
[facility] MD Progress Note  01/06/2023 12:38 PM Yvonne Pitts  MRN:  [id number] Subjective: Yvonne Pitts is seen on rounds.  She has been compliant with her medication.  She denies any side effects.  This is the first time that she has been on Depakote and she seems to be doing pretty well with it.  She continues with Seroquel at bedtime.  She says that she still has problems sleeping and that probably could hurt for her to have a little bit higher dose. Principal Problem: Bipolar affective disorder, currently manic, moderate (HCC) Diagnosis: Principal Problem:   Bipolar affective disorder, currently manic, moderate (HCC) Active Problems:   Tobacco abuse   Leukocytosis  Total Time spent with patient: 15 minutes  Past Psychiatric History: She sees Dr. Modesta Messing outpatient. This is her second psychiatric admission for mania. Apparently, she has some memory problems too.   Past Medical History:  Past Medical History:  Diagnosis Date   Bipolar 1 disorder ([place])    COPD (chronic obstructive pulmonary disease) ([place]) 11/03/2016   DDD (degenerative disc disease), lumbar    Polycythemia    Ruptured disc, cervical    Scoliosis     Past Surgical History:  Procedure Laterality Date   BREAST EXCISIONAL BIOPSY Right    years ago   DILATION AND CURETTAGE OF UTERUS     NECK SURGERY     Family History:  Family History  Problem Relation Age of Onset   Anxiety disorder Brother    Breast cancer Maternal Aunt 40   Colon cancer Maternal Aunt    Bone cancer Paternal Aunt     Social History:  Social History   Substance and Sexual Activity  Alcohol Use Not Currently   Comment: seldom maybe 2-3 times a year     Social History   Substance and Sexual Activity  Drug Use No    Social History   Socioeconomic History   Marital status: Married    Spouse name: Not on file   Number of children: 2   Years of education: Not on file   Highest education level: Some college, no degree  Occupational History    Not on file  Tobacco Use   Smoking status: Every Day    Packs/day: 2.00    Years: 43.00    Total pack years: 86.00    Types: Cigarettes   Smokeless tobacco: Never  Vaping Use   Vaping Use: Never used  Substance and Sexual Activity   Alcohol use: Not Currently    Comment: seldom maybe 2-3 times a year   Drug use: No   Sexual activity: Not Currently  Other Topics Concern   Not on file  Social History Narrative   Not on file   Social Determinants of Health   Financial Resource Strain: Not on file  Food Insecurity: No Food Insecurity (01/02/2023)   Hunger Vital Sign    Worried About Running Out of Food in the Last Year: Never true    Ran Out of Food in the Last Year: Never true  Transportation Needs: No Transportation Needs (01/02/2023)   PRAPARE - Hydrologist (Medical): No    Lack of Transportation (Non-Medical): No  Physical Activity: Not on file  Stress: Not on file  Social Connections: Not on file   Additional Social History:                         Sleep: Fair  Appetite:  Fair  Current Medications: Current Facility-Administered Medications  Medication Dose Route Frequency Provider Last Rate Last Admin   acetaminophen (TYLENOL) tablet 650 mg  650 mg Oral Q6H PRN Patrecia Pour, NP   650 mg at 01/06/23 0409   albuterol (PROVENTIL) (2.5 MG/3ML) 0.083% nebulizer solution 3 mL  3 mL Inhalation Q6H PRN Patrecia Pour, NP       alum & mag hydroxide-simeth (MAALOX/MYLANTA) 200-200-20 MG/5ML suspension 30 mL  30 mL Oral Q4H PRN Patrecia Pour, NP   30 mL at 01/06/23 0934   amLODipine (NORVASC) tablet 5 mg  5 mg Oral Daily Parks Ranger, DO   5 mg at 01/06/23 1443   citalopram (CELEXA) tablet 20 mg  20 mg Oral Daily Patrecia Pour, NP   20 mg at 01/06/23 1540   cyanocobalamin (VITAMIN B12) tablet 1,000 mcg  1,000 mcg Oral Daily Patrecia Pour, NP   1,000 mcg at 01/06/23 0934   divalproex (DEPAKOTE) DR tablet 250 mg  250 mg  Oral [id number] Parks Ranger, DO   250 mg at 01/06/23 0867   LORazepam (ATIVAN) tablet 1 mg  1 mg Oral Q4H PRN Parks Ranger, DO   1 mg at 01/05/23 6195   magnesium hydroxide (MILK OF MAGNESIA) suspension 30 mL  30 mL Oral Daily PRN Patrecia Pour, NP       memantine [facility]) tablet 5 mg  5 mg Oral QHS Parks Ranger, DO   5 mg at 01/05/23 2052   nicotine (NICODERM CQ - dosed in mg/24 hours) patch 14 mg  14 mg Transdermal Daily Parks Ranger, DO   14 mg at 01/06/23 0945   OLANZapine (ZYPREXA) tablet 5 mg  5 mg Oral Q6H PRN Parks Ranger, DO   5 mg at 01/06/23 0932   QUEtiapine (SEROQUEL) tablet 100 mg  100 mg Oral QHS Parks Ranger, DO   100 mg at 01/05/23 2052   traZODone (DESYREL) tablet 100 mg  100 mg Oral QHS PRN Parks Ranger, DO        Lab Results: No results found for this or any previous visit (from the past 48 hour(s)).  Blood Alcohol level:  Lab Results  Component Value Date   ETH <10 01/01/2023   ETH <10 67/11/4579    Metabolic Disorder Labs: Lab Results  Component Value Date   HGBA1C 5.4 08/09/2022   MPG 108.28 08/09/2022   No results found for: "PROLACTIN" Lab Results  Component Value Date   CHOL 182 08/09/2022   TRIG 106 08/09/2022   HDL 65 08/09/2022   CHOLHDL 2.8 08/09/2022   VLDL 21 08/09/2022   LDLCALC 96 08/09/2022    Physical Findings: AIMS:  , ,  ,  ,    CIWA:    COWS:     Musculoskeletal: Strength & Muscle Tone: within normal limits Gait & Station: normal Patient leans: N/A  Psychiatric Specialty Exam:  Presentation  General Appearance:  Bizarre  Eye Contact: Good  Speech: Clear and Coherent  Speech Volume: Normal  Handedness:No data recorded  Mood and Affect  Mood: Anxious; Labile; Euphoric  Affect: Congruent   Thought Process  Thought Processes: Disorganized  Descriptions of Associations:Loose  Orientation:Partial  Thought  Content:Illogical  History of Schizophrenia/Schizoaffective disorder:No  Duration of Psychotic Symptoms:No data recorded Hallucinations:No data recorded Ideas of Reference:None  Suicidal Thoughts:No data recorded Homicidal Thoughts:No data recorded  Sensorium  Memory: Immediate Poor; Recent Poor  Judgment: Impaired  Insight: Lacking   Executive Functions  Concentration: Poor  Attention Span: Poor  Recall: Poor  Fund of Knowledge: Poor  Language: Poor   Psychomotor Activity  Psychomotor Activity:No data recorded  Assets  Assets: Resilience; Social Support; Housing; Financial Resources/Insurance   Sleep  Sleep:No data recorded   Physical Exam: Physical Exam Vitals and nursing note reviewed.  Constitutional:      Appearance: Normal appearance. She is normal weight.  Neurological:     General: No focal deficit present.     Mental Status: She is alert and oriented to person, place, and time.  Psychiatric:        Attention and Perception: Attention and perception normal.        Mood and Affect: Mood and affect normal.        Speech: Speech normal.        Behavior: Behavior normal. Behavior is cooperative.        Thought Content: Thought content normal.        Cognition and Memory: Cognition and memory normal.        Judgment: Judgment normal.    Review of Systems  Constitutional: Negative.   HENT: Negative.    Eyes: Negative.   Respiratory: Negative.    Cardiovascular: Negative.   Gastrointestinal: Negative.   Genitourinary: Negative.   Musculoskeletal: Negative.   Skin: Negative.   Neurological: Negative.   Endo/Heme/Allergies: Negative.   Psychiatric/Behavioral: Negative.     Blood pressure (!) 125/56, pulse 65, temperature 98.1 F (36.7 C), temperature source Oral, resp. rate 18, height '5\' 4"'$  (1.626 m), weight 44.2 kg, SpO2 98 %. Body mass index is 16.74 kg/m.   Treatment Plan Summary: Daily contact with patient to assess and  evaluate symptoms and progress in treatment, Medication management, and Plan increase Seroquel to 150 mg at bedtime.  Parks Ranger, DO 01/06/2023, 12:38 PM

## 2023-01-06 NOTE — B[facility] Group Notes (Signed)
[facility] Group Notes:  (Nursing/MHT/Case Management/Adjunct)  Date:  01/06/2023  Time:  6:10 PM  Type of Therapy:  Music Therapy  Participation Level:  Active  Participation Quality:  Appropriate  Affect:  Appropriate  Cognitive:  Alert and Oriented  Insight:  Appropriate  Engagement in Group:  Engaged  Modes of Intervention:  Activity  Summary of Progress/Problems:  Erianna Jolly l Degan Hanser 01/06/2023, 6:10 PM

## 2023-01-06 NOTE — Progress Notes (Signed)
Patient is alert and oriented times 4. Mood and affect appropriate. Patient denies pain. She denies SI, HI, and AVH. Also denies feelings of anxiety and depression at this time. States she slept good last night. Morning meds given whole by mouth W/O difficulty. Patient is manic, talkative, smiling, and stating she loves everyone. Ate breakfast in day room- appetite good. Patient remains on unit with Q15 minute checks in place.

## 2023-01-07 DIAGNOSIS — F3112 Bipolar disorder, current episode manic without psychotic features, moderate: Secondary | ICD-10-CM | POA: Diagnosis not present

## 2023-01-07 MED ORDER — AMLODIPINE BESYLATE 5 MG PO TABS
10.0000 mg | ORAL_TABLET | Freq: Every day | ORAL | Status: DC
  Filled 2023-01-07: qty 2

## 2023-01-07 NOTE — Progress Notes (Signed)
[facility] MD Progress Note  01/07/2023 1:18 PM Yvonne Pitts  MRN:  [id number] Subjective: Yvonne Pitts is seen on rounds.  She has been compliant with her medications.  She denies any side effects.  She is sleeping well and her appetite is fair.  She is very talkative and pleasant.  Her blood pressure is still a little bit high so we will go up on her Norvasc. Principal Problem: Bipolar affective disorder, currently manic, moderate (HCC) Diagnosis: Principal Problem:   Bipolar affective disorder, currently manic, moderate (HCC) Active Problems:   Tobacco abuse   Leukocytosis  Total Time spent with patient: 15 minutes  Past Psychiatric History: She sees Dr. Modesta Messing outpatient. This is her second psychiatric admission for mania. Apparently, she has some memory problems too.    Past Medical History:  Past Medical History:  Diagnosis Date   Bipolar 1 disorder ([place])    COPD (chronic obstructive pulmonary disease) ([place]) 11/03/2016   DDD (degenerative disc disease), lumbar    Polycythemia    Ruptured disc, cervical    Scoliosis     Past Surgical History:  Procedure Laterality Date   BREAST EXCISIONAL BIOPSY Right    years ago   DILATION AND CURETTAGE OF UTERUS     NECK SURGERY     Family History:  Family History  Problem Relation Age of Onset   Anxiety disorder Brother    Breast cancer Maternal Aunt 40   Colon cancer Maternal Aunt    Bone cancer Paternal Aunt     Social History:  Social History   Substance and Sexual Activity  Alcohol Use Not Currently   Comment: seldom maybe 2-3 times a year     Social History   Substance and Sexual Activity  Drug Use No    Social History   Socioeconomic History   Marital status: Married    Spouse name: Not on file   Number of children: 2   Years of education: Not on file   Highest education level: Some college, no degree  Occupational History   Not on file  Tobacco Use   Smoking status: Every Day    Packs/day: 2.00    Years: 43.00     Total pack years: 86.00    Types: Cigarettes   Smokeless tobacco: Never  Vaping Use   Vaping Use: Never used  Substance and Sexual Activity   Alcohol use: Not Currently    Comment: seldom maybe 2-3 times a year   Drug use: No   Sexual activity: Not Currently  Other Topics Concern   Not on file  Social History Narrative   Not on file   Social Determinants of Health   Financial Resource Strain: Not on file  Food Insecurity: No Food Insecurity (01/02/2023)   Hunger Vital Sign    Worried About Running Out of Food in the Last Year: Never true    Ran Out of Food in the Last Year: Never true  Transportation Needs: No Transportation Needs (01/02/2023)   PRAPARE - Hydrologist (Medical): No    Lack of Transportation (Non-Medical): No  Physical Activity: Not on file  Stress: Not on file  Social Connections: Not on file   Additional Social History:                         Sleep: Good  Appetite:  Fair  Current Medications: Current Facility-Administered Medications  Medication Dose Route Frequency Provider Last Rate  Last Admin   acetaminophen (TYLENOL) tablet 650 mg  650 mg Oral Q6H PRN Patrecia Pour, NP   650 mg at 01/07/23 1010   albuterol (PROVENTIL) (2.5 MG/3ML) 0.083% nebulizer solution 3 mL  3 mL Inhalation Q6H PRN Patrecia Pour, NP       alum & mag hydroxide-simeth (MAALOX/MYLANTA) 200-200-20 MG/5ML suspension 30 mL  30 mL Oral Q4H PRN Patrecia Pour, NP   30 mL at 01/07/23 1010   amLODipine (NORVASC) tablet 5 mg  5 mg Oral Daily Parks Ranger, DO   5 mg at 01/07/23 1011   citalopram (CELEXA) tablet 20 mg  20 mg Oral Daily Patrecia Pour, NP   20 mg at 01/07/23 1010   cyanocobalamin (VITAMIN B12) tablet 1,000 mcg  1,000 mcg Oral Daily Patrecia Pour, NP   1,000 mcg at 01/07/23 1011   divalproex (DEPAKOTE) DR tablet 250 mg  250 mg Oral [id number] Parks Ranger, DO   250 mg at 01/07/23 1010   LORazepam (ATIVAN) tablet  1 mg  1 mg Oral Q4H PRN Parks Ranger, DO   1 mg at 01/07/23 1010   magnesium hydroxide (MILK OF MAGNESIA) suspension 30 mL  30 mL Oral Daily PRN Patrecia Pour, NP       memantine [facility]) tablet 5 mg  5 mg Oral QHS Parks Ranger, DO   5 mg at 01/06/23 2124   nicotine (NICODERM CQ - dosed in mg/24 hours) patch 14 mg  14 mg Transdermal Daily Parks Ranger, DO   14 mg at 01/07/23 1013   OLANZapine (ZYPREXA) tablet 5 mg  5 mg Oral Q6H PRN Parks Ranger, DO   5 mg at 01/06/23 0934   QUEtiapine (SEROQUEL) tablet 150 mg  150 mg Oral QHS Parks Ranger, DO   150 mg at 01/06/23 2115   traZODone (DESYREL) tablet 100 mg  100 mg Oral QHS PRN Parks Ranger, DO        Lab Results: No results found for this or any previous visit (from the past 48 hour(s)).  Blood Alcohol level:  Lab Results  Component Value Date   ETH <10 01/01/2023   ETH <10 42/35/3614    Metabolic Disorder Labs: Lab Results  Component Value Date   HGBA1C 5.4 08/09/2022   MPG 108.28 08/09/2022   No results found for: "PROLACTIN" Lab Results  Component Value Date   CHOL 182 08/09/2022   TRIG 106 08/09/2022   HDL 65 08/09/2022   CHOLHDL 2.8 08/09/2022   VLDL 21 08/09/2022   LDLCALC 96 08/09/2022    Physical Findings: AIMS:  , ,  ,  ,    CIWA:    COWS:     Musculoskeletal: Strength & Muscle Tone: within normal limits Gait & Station: normal Patient leans: N/A  Psychiatric Specialty Exam:  Presentation  General Appearance:  Bizarre  Eye Contact: Good  Speech: Clear and Coherent  Speech Volume: Normal  Handedness:No data recorded  Mood and Affect  Mood: Anxious; Labile; Euphoric  Affect: Congruent   Thought Process  Thought Processes: Disorganized  Descriptions of Associations:Loose  Orientation:Partial  Thought Content:Illogical  History of Schizophrenia/Schizoaffective disorder:No  Duration of Psychotic Symptoms:No data  recorded Hallucinations:No data recorded Ideas of Reference:None  Suicidal Thoughts:No data recorded Homicidal Thoughts:No data recorded  Sensorium  Memory: Immediate Poor; Recent Poor  Judgment: Impaired  Insight: Lacking   Executive Functions  Concentration: Poor  Attention Span: Poor  Recall:  Poor  Fund of Knowledge: Poor  Language: Poor   Psychomotor Activity  Psychomotor Activity:No data recorded  Assets  Assets: Resilience; Social Support; Housing; Financial Resources/Insurance   Sleep  Sleep:No data recorded   Physical Exam: Physical Exam Vitals and nursing note reviewed.  Constitutional:      Appearance: Normal appearance. She is normal weight.  Neurological:     General: No focal deficit present.     Mental Status: She is alert and oriented to person, place, and time.  Psychiatric:        Attention and Perception: Attention and perception normal.        Mood and Affect: Mood is elated.        Speech: Speech normal.        Behavior: Behavior normal. Behavior is cooperative.        Thought Content: Thought content normal.        Cognition and Memory: Cognition and memory normal.        Judgment: Judgment normal.    Review of Systems  Constitutional: Negative.   HENT: Negative.    Eyes: Negative.   Respiratory: Negative.    Cardiovascular: Negative.   Gastrointestinal: Negative.   Genitourinary: Negative.   Musculoskeletal: Negative.   Skin: Negative.   Neurological: Negative.   Endo/Heme/Allergies: Negative.   Psychiatric/Behavioral: Negative.     Blood pressure (!) 145/75, pulse 66, temperature 97.7 F (36.5 C), temperature source Oral, resp. rate 16, height '5\' 4"'$  (1.626 m), weight 44.2 kg, SpO2 99 %. Body mass index is 16.74 kg/m.   Treatment Plan Summary: Daily contact with patient to assess and evaluate symptoms and progress in treatment, Medication management, and Plan increase Norvasc to 10 mg/day.  Parks Ranger, DO 01/07/2023, 1:18 PM

## 2023-01-07 NOTE — BHH Group Notes (Signed)
Activity group of light exercises completed. Pt attended and was appropriate.

## 2023-01-07 NOTE — Group Note (Signed)
[facility] LCSW Group Therapy Note   Group Date: 01/07/2023 Start Time: 1320 End Time: 1400   Type of Therapy/Topic:  Group Therapy:  Balance in Life  Participation Level:  Did Not Attend   Description of Group:    This group will address the concept of balance and how it feels and looks when one is unbalanced. Patients will be encouraged to process areas in their lives that are out of balance, and identify reasons for remaining unbalanced. Facilitators will guide patients utilizing problem- solving interventions to address and correct the stressor making their life unbalanced. Understanding and applying boundaries will be explored and addressed for obtaining  and maintaining a balanced life. Patients will be encouraged to explore ways to assertively make their unbalanced needs known to significant others in their lives, using other group members and facilitator for support and feedback.  Therapeutic Goals: Patient will identify two or more emotions or situations they have that consume much of in their lives. Patient will identify signs/triggers that life has become out of balance:  Patient will identify two ways to set boundaries in order to achieve balance in their lives:  Patient will demonstrate ability to communicate their needs through discussion and/or role plays  Summary of Patient Progress: X    Therapeutic Modalities:   Cognitive Behavioral Therapy Solution-Focused Therapy Assertiveness Training   [place], LCSWA

## 2023-01-07 NOTE — Progress Notes (Signed)
   01/07/23 0742  Psych Admission Type (Psych Patients Only)  Admission Status Involuntary  Psychosocial Assessment  Eye Contact Fair  Facial Expression Animated;Fixed smile  Affect Appropriate to circumstance  Speech Logical/coherent  Interaction Assertive;Intrusive  Motor Activity Restless  Appearance/Hygiene Unremarkable  Thought Process  Coherency WDL  Content WDL  Delusions None reported or observed  Perception WDL  Hallucination None reported or observed  Judgment Impaired  Confusion None  Danger to Self  Current suicidal ideation? Denies  Danger to Others  Danger to Others None reported or observed

## 2023-01-07 NOTE — Progress Notes (Signed)
Patient is alert and oriented times 4. Mood and affect appropriate. Patient denies pain. She denies SI, HI, and AVH. Also denies feelings of anxiety and depression at this time. States she slept good last night. Morning meds given whole by mouth W/O difficulty. Patient is manic, talkative, smiling, and stating she loves everyone. Ate breakfast in day room- appetite good. Patient remains on unit with Q15 minute checks in place.

## 2023-01-07 NOTE — BHH Group Notes (Signed)
[employer], and psychoeducational group held.  Two poems were read, and patients were encouraged to use reflection of previous experiences to help identify their strengths and recognize negative thought patterns.  Pt attended, she shared that she is feeling better and feels thankful for the staff, and is hopeful to leave on Friday.

## 2023-01-07 NOTE — Plan of Care (Signed)
  Problem: Education: Goal: Knowledge of General Education information will improve Description: Including pain rating scale, medication(s)/side effects and non-pharmacologic comfort measures Outcome: Progressing   Problem: Health Behavior/Discharge Planning: Goal: Ability to manage health-related needs will improve Outcome: Progressing   Problem: Activity: Goal: Risk for activity intolerance will decrease Outcome: Progressing   Problem: Nutrition: Goal: Adequate nutrition will be maintained Outcome: Progressing   Problem: Elimination: Goal: Will not experience complications related to bowel motility Outcome: Progressing Goal: Will not experience complications related to urinary retention Outcome: Progressing   Problem: Pain Managment: Goal: General experience of comfort will improve Outcome: Progressing   Problem: Health Behavior/Discharge Planning: Goal: Identification of resources available to assist in meeting health care needs will improve Outcome: Progressing Goal: Compliance with treatment plan for underlying cause of condition will improve Outcome: Progressing   Problem: Self-Concept: Goal: Ability to identify factors that promote anxiety will improve Outcome: Progressing Goal: Level of anxiety will decrease Outcome: Progressing Goal: Ability to modify response to factors that promote anxiety will improve Outcome: Progressing

## 2023-01-08 ENCOUNTER — Other Ambulatory Visit: Payer: Self-pay | Admitting: Psychiatry

## 2023-01-08 DIAGNOSIS — F3112 Bipolar disorder, current episode manic without psychotic features, moderate: Secondary | ICD-10-CM | POA: Diagnosis not present

## 2023-01-08 MED ORDER — AMLODIPINE BESYLATE 5 MG PO TABS
5.0000 mg | ORAL_TABLET | Freq: Every day | ORAL | Status: DC
  Administered 2023-01-09: 5 mg via ORAL
  Filled 2023-01-08: qty 1

## 2023-01-08 NOTE — Group Note (Signed)
[facility] LCSW Group Therapy Note   Group Date: 01/08/2023 Start Time: 8250 End Time: 1500  Type of Therapy and Topic:  Group Therapy:  Feelings around Relapse and Recovery  Participation Level:  Active   Mood:  Description of Group:    Patients in this group will discuss emotions they experience before and after a relapse. They will process how experiencing these feelings, or avoidance of experiencing them, relates to having a relapse. Facilitator will guide patients to explore emotions they have related to recovery. Patients will be encouraged to process which emotions are more powerful. They will be guided to discuss the emotional reaction significant others in their lives may have to patients' relapse or recovery. Patients will be assisted in exploring ways to respond to the emotions of others without this contributing to a relapse.  Therapeutic Goals: Patient will identify two or more emotions that lead to relapse for them:  Patient will identify two emotions that result when they relapse:  Patient will identify two emotions related to recovery:  Patient will demonstrate ability to communicate their needs through discussion and/or role plays.   Summary of Patient Progress:  Patient was present for the entirety of the group session. Patient was an active listener and participated in the topic of discussion, provided helpful advice to others, and added nuance to topic of conversation.   CSW led participants in assessing self-care to aid in development of a personal recovery model. She said "I need to improve in everything" referring to her self-care activities.    Therapeutic Modalities:   Cognitive Behavioral Therapy Solution-Focused Therapy Assertiveness Training Relapse Prevention Therapy   Sophina Mitten A [place], LCSWA

## 2023-01-08 NOTE — Progress Notes (Signed)
Pt did not receive morning schedule dose of amlodipine due to low BP, MD notified.

## 2023-01-08 NOTE — Plan of Care (Signed)
Pt denies anxiety/depression at this time. Pt denies SI/HI/AVH or pain at this time. Pt is calm and cooperative. Pt is medication compliant. Pt provided with support and encouragement. Pt monitored q15 minutes for safety per unit policy. Plan of care ongoing.   Problem: Nutrition: Goal: Adequate nutrition will be maintained Outcome: Progressing   Problem: Coping: Goal: Level of anxiety will decrease Outcome: Progressing   

## 2023-01-08 NOTE — Progress Notes (Signed)
Patient is animated and cooperative with assessment. She denies SI, HI, and AVH. She is talkative and smiling, saying that she loves everyone here. She denies pain and other physical problems. She is compliant with scheduled medications. Patient remains safe on the unit at this time.

## 2023-01-08 NOTE — Progress Notes (Signed)
[facility] MD Progress Note  01/08/2023 1:37 PM Yvonne Pitts  MRN:  [id number] Subjective: Yvonne Pitts is seen on rounds.  She has been compliant with medications.  No side effects.  She was talking to her husband on the phone so I briefly spoke to him and his biggest concern was that she does not take her medicines at home.  She said that she would.  I told him that it was a new medication and that she seems to be okay with that.  She does not like her previous medications.  Although the Celexa and Seroquel are the same her blood pressure is a little bit low so we will go back down on her Norvasc. Principal Problem: Bipolar affective disorder, currently manic, moderate (HCC) Diagnosis: Principal Problem:   Bipolar affective disorder, currently manic, moderate (HCC) Active Problems:   Tobacco abuse   Leukocytosis  Total Time spent with patient: 15 minutes  Past Psychiatric History: She sees Dr. Modesta Messing outpatient. This is her second psychiatric admission for mania. Apparently, she has some memory problems too.     Past Medical History:  Past Medical History:  Diagnosis Date   Bipolar 1 disorder ([place])    COPD (chronic obstructive pulmonary disease) ([place]) 11/03/2016   DDD (degenerative disc disease), lumbar    Polycythemia    Ruptured disc, cervical    Scoliosis     Past Surgical History:  Procedure Laterality Date   BREAST EXCISIONAL BIOPSY Right    years ago   DILATION AND CURETTAGE OF UTERUS     NECK SURGERY     Family History:  Family History  Problem Relation Age of Onset   Anxiety disorder Brother    Breast cancer Maternal Aunt 40   Colon cancer Maternal Aunt    Bone cancer Paternal Aunt    Family Psychiatric  History: Unremarkable Social History:  Social History   Substance and Sexual Activity  Alcohol Use Not Currently   Comment: seldom maybe 2-3 times a year     Social History   Substance and Sexual Activity  Drug Use No    Social History   Socioeconomic History    Marital status: Married    Spouse name: Not on file   Number of children: 2   Years of education: Not on file   Highest education level: Some college, no degree  Occupational History   Not on file  Tobacco Use   Smoking status: Every Day    Packs/day: 2.00    Years: 43.00    Total pack years: 86.00    Types: Cigarettes   Smokeless tobacco: Never  Vaping Use   Vaping Use: Never used  Substance and Sexual Activity   Alcohol use: Not Currently    Comment: seldom maybe 2-3 times a year   Drug use: No   Sexual activity: Not Currently  Other Topics Concern   Not on file  Social History Narrative   Not on file   Social Determinants of Health   Financial Resource Strain: Not on file  Food Insecurity: No Food Insecurity (01/02/2023)   Hunger Vital Sign    Worried About Running Out of Food in the Last Year: Never true    Ran Out of Food in the Last Year: Never true  Transportation Needs: No Transportation Needs (01/02/2023)   PRAPARE - Hydrologist (Medical): No    Lack of Transportation (Non-Medical): No  Physical Activity: Not on file  Stress: Not on  file  Social Connections: Not on file   Additional Social History:                         Sleep: Good  Appetite:  Good  Current Medications: Current Facility-Administered Medications  Medication Dose Route Frequency Provider Last Rate Last Admin   acetaminophen (TYLENOL) tablet 650 mg  650 mg Oral Q6H PRN Patrecia Pour, NP   650 mg at 01/08/23 1008   albuterol (PROVENTIL) (2.5 MG/3ML) 0.083% nebulizer solution 3 mL  3 mL Inhalation Q6H PRN Patrecia Pour, NP       alum & mag hydroxide-simeth (MAALOX/MYLANTA) 200-200-20 MG/5ML suspension 30 mL  30 mL Oral Q4H PRN Patrecia Pour, NP   30 mL at 01/08/23 1033   amLODipine (NORVASC) tablet 10 mg  10 mg Oral Daily Parks Ranger, DO       citalopram (CELEXA) tablet 20 mg  20 mg Oral Daily Patrecia Pour, NP   20 mg at 01/08/23 7564    cyanocobalamin (VITAMIN B12) tablet 1,000 mcg  1,000 mcg Oral Daily Patrecia Pour, NP   1,000 mcg at 01/08/23 0933   divalproex (DEPAKOTE) DR tablet 250 mg  250 mg Oral [id number] Parks Ranger, DO   250 mg at 01/08/23 3329   LORazepam (ATIVAN) tablet 1 mg  1 mg Oral Q4H PRN Parks Ranger, DO   1 mg at 01/07/23 1010   magnesium hydroxide (MILK OF MAGNESIA) suspension 30 mL  30 mL Oral Daily PRN Patrecia Pour, NP       memantine [facility]) tablet 5 mg  5 mg Oral QHS Parks Ranger, DO   5 mg at 01/07/23 2127   nicotine (NICODERM CQ - dosed in mg/24 hours) patch 14 mg  14 mg Transdermal Daily Parks Ranger, DO   14 mg at 01/08/23 0934   OLANZapine (ZYPREXA) tablet 5 mg  5 mg Oral Q6H PRN Parks Ranger, DO   5 mg at 01/06/23 5188   QUEtiapine (SEROQUEL) tablet 150 mg  150 mg Oral QHS Parks Ranger, DO   150 mg at 01/07/23 2127   traZODone (DESYREL) tablet 100 mg  100 mg Oral QHS PRN Parks Ranger, DO        Lab Results: No results found for this or any previous visit (from the past 48 hour(s)).  Blood Alcohol level:  Lab Results  Component Value Date   ETH <10 01/01/2023   ETH <10 41/66/0630    Metabolic Disorder Labs: Lab Results  Component Value Date   HGBA1C 5.4 08/09/2022   MPG 108.28 08/09/2022   No results found for: "PROLACTIN" Lab Results  Component Value Date   CHOL 182 08/09/2022   TRIG 106 08/09/2022   HDL 65 08/09/2022   CHOLHDL 2.8 08/09/2022   VLDL 21 08/09/2022   LDLCALC 96 08/09/2022    Physical Findings: AIMS:  , ,  ,  ,    CIWA:    COWS:     Musculoskeletal: Strength & Muscle Tone: within normal limits Gait & Station: normal Patient leans: N/A  Psychiatric Specialty Exam:  Presentation  General Appearance:  Bizarre  Eye Contact: Good  Speech: Clear and Coherent  Speech Volume: Normal  Handedness:No data recorded  Mood and Affect  Mood: Anxious; Labile;  Euphoric  Affect: Congruent   Thought Process  Thought Processes: Disorganized  Descriptions of Associations:Loose  Orientation:Partial  Thought Content:Illogical  History of Schizophrenia/Schizoaffective disorder:No  Duration of Psychotic Symptoms:No data recorded Hallucinations:No data recorded Ideas of Reference:None  Suicidal Thoughts:No data recorded Homicidal Thoughts:No data recorded  Sensorium  Memory: Immediate Poor; Recent Poor  Judgment: Impaired  Insight: Lacking   Executive Functions  Concentration: Poor  Attention Span: Poor  Recall: Poor  Fund of Knowledge: Poor  Language: Poor   Psychomotor Activity  Psychomotor Activity:No data recorded  Assets  Assets: Resilience; Social Support; Housing; Financial Resources/Insurance   Sleep  Sleep:No data recorded   Physical Exam: Physical Exam Vitals and nursing note reviewed.  Constitutional:      Appearance: Normal appearance. She is normal weight.  Neurological:     General: No focal deficit present.     Mental Status: She is alert and oriented to person, place, and time.  Psychiatric:        Attention and Perception: Attention and perception normal.        Mood and Affect: Mood and affect normal.        Speech: Speech normal.        Behavior: Behavior normal. Behavior is cooperative.        Thought Content: Thought content normal.        Cognition and Memory: Cognition and memory normal.        Judgment: Judgment normal.    Review of Systems  Constitutional: Negative.   HENT: Negative.    Eyes: Negative.   Respiratory: Negative.    Cardiovascular: Negative.   Gastrointestinal: Negative.   Genitourinary: Negative.   Musculoskeletal: Negative.   Skin: Negative.   Neurological: Negative.   Endo/Heme/Allergies: Negative.   Psychiatric/Behavioral: Negative.     Blood pressure (!) 113/56, pulse 89, temperature 98.6 F (37 C), temperature source Oral, resp. rate 18,  height '5\' 4"'$  (1.626 m), weight 44.2 kg, SpO2 97 %. Body mass index is 16.74 kg/m.   Treatment Plan Summary: Daily contact with patient to assess and evaluate symptoms and progress in treatment, Medication management, and Plan decrease Norvasc to 5 mg/day.  Continue current medications.  Discharge tomorrow.  Follow-up with Dr. Modesta Messing.  Parks Ranger, DO 01/08/2023, 1:37 PM

## 2023-01-08 NOTE — BHH Group Notes (Signed)
Psychoeducational group completed.  Patients were given two poems to read, and asked within the framework of cognitive behavioral therapy and positive affirmations,  to identify one positive thought about themselves and one negative belief they would like to let go of.  Pt participated and was appropriate.

## 2023-01-09 MED ORDER — CITALOPRAM HYDROBROMIDE 20 MG PO TABS: 20.0000 mg | ORAL_TABLET | Freq: Every day | ORAL | 3 refills | Status: DC

## 2023-01-09 MED ORDER — DIVALPROEX SODIUM 250 MG PO DR TAB: 250.0000 mg | DELAYED_RELEASE_TABLET | ORAL | 3 refills | Status: DC

## 2023-01-09 MED ORDER — QUETIAPINE FUMARATE 50 MG PO TABS: 150.0000 mg | ORAL_TABLET | Freq: Every day | ORAL | 3 refills | Status: DC

## 2023-01-09 MED ORDER — AMLODIPINE BESYLATE 5 MG PO TABS: 5.0000 mg | ORAL_TABLET | Freq: Every day | ORAL | 3 refills | Status: AC

## 2023-01-09 MED ORDER — MEMANTINE HCL 5 MG PO TABS: 5.0000 mg | ORAL_TABLET | Freq: Every day | ORAL | 3 refills | Status: DC

## 2023-01-09 NOTE — Care Management Important Message (Signed)
Important Message  Patient Details  Name: Yvonne Pitts MRN: [id number] Date of Birth: 1956-02-13   Medicare Important Message Given:  Yes     Yoona Ishii A [place], Latanya Presser 01/09/2023, 9:48 AM

## 2023-01-09 NOTE — Discharge Summary (Signed)
Physician Discharge Summary Note  Patient:  Yvonne Pitts is an 67 y.o., female MRN:  [id number] DOB:  1956-10-27 Patient phone:  [phone] (home)  Patient address:   [place] [address],  Total Time spent with patient: 1 hour  Date of Admission:  01/02/2023 Date of Discharge: 01/09/2023  Reason for Admission:  Yvonne Pitts is a 67 year old white female who is involuntarily admitted to geriatric psychiatry for manic behavior.  Yvonne Pitts was recently discharged from this unit back in August for similar presentation.  She tells me that she does not like her medications and stopped them a few weeks ago.  I asked her why and she said because "they are killing me."  She was discharged on Lamictal and Seroquel.  She is unable to be more specific on what the problem is.  Husband IVC her because of her not sleeping and being more agitated.  She sees Dr. Modesta Messing outpatient.  She is currently taking Celexa and Zyprexa.  She sees neurology for cognitive and behavioral changes.  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   Principal Problem: Bipolar affective disorder, currently manic, moderate ([place]) Discharge Diagnoses: Principal Problem:   Bipolar affective disorder, currently manic, moderate (HCC) Active Problems:   Tobacco abuse   Leukocytosis   Past Psychiatric History: She sees Dr. Modesta Messing outpatient. This is her second psychiatric admission for mania. Apparently, she has some memory problems too.      Past Medical History:  Past Medical History:  Diagnosis Date   Bipolar 1 disorder ([place])    COPD (chronic obstructive pulmonary disease) ([place]) 11/03/2016   DDD (degenerative disc disease), lumbar    Polycythemia    Ruptured disc, cervical    Scoliosis     Past Surgical History:  Procedure Laterality Date   BREAST EXCISIONAL BIOPSY Right    years ago   DILATION AND CURETTAGE OF UTERUS     NECK SURGERY     Family History:  Family History   Problem Relation Age of Onset   Anxiety disorder Brother    Breast cancer Maternal Aunt 40   Colon cancer Maternal Aunt    Bone cancer Paternal Aunt    Family Psychiatric  History: Unremarkable Social History:  Social History   Substance and Sexual Activity  Alcohol Use Not Currently   Comment: seldom maybe 2-3 times a year     Social History   Substance and Sexual Activity  Drug Use No    Social History   Socioeconomic History   Marital status: Married    Spouse name: Not on file   Number of children: 2   Years of education: Not on file   Highest education level: Some college, no degree  Occupational History   Not on file  Tobacco Use   Smoking status: Every Day    Packs/day: 2.00    Years: 43.00    Total pack years: 86.00    Types: Cigarettes   Smokeless tobacco: Never  Vaping Use   Vaping Use: Never used  Substance and Sexual Activity   Alcohol use: Not Currently    Comment: seldom maybe 2-3 times a year   Drug use: No   Sexual activity: Not Currently  Other Topics Concern   Not on file  Social History Narrative   Not on file   Social Determinants of Health   Financial Resource Strain: Not on file  Food Insecurity: No Food Insecurity (01/02/2023)   Hunger  Vital Sign    Worried About Charity fundraiser in the Last Year: Never true    Ran Out of Food in the Last Year: Never true  Transportation Needs: No Transportation Needs (01/02/2023)   PRAPARE - Hydrologist (Medical): No    Lack of Transportation (Non-Medical): No  Physical Activity: Not on file  Stress: Not on file  Social Connections: Not on file    Hospital Course: Yvonne Pitts is a 66 year old white female who was involuntarily admitted to geriatric psychiatry for worsening behavior and mania.  Yvonne Pitts stopped taking her Lamictal because she states that she did not like it and it was killing her.  She also stopped her Seroquel and Celexa and was not sleeping.  She was  restarted on Seroquel which was titrated up to 250 mg at bedtime and Celexa 20 mg/day.  She was okay with that.  Depakote 250 mg twice a day was added and she did well on this.  She tolerated without any side effects.  She was pleasant and cooperative while on the unit.  Still somewhat elevated but in good controls and compliant with medications.  She said that she would take the medications at home and that she was fine with this combination.  She did have some blood pressure problems and her Norvasc was increased to 5 mg/day.  Namenda was added at bedtime due to complaints of memory problems probably associated with her nicotine addiction.  It was felt that she maximized hospitalization she was discharged home.  On the day of discharge she denied suicidal ideation or homicidal ideation auditory or visual hallucinations.  Her judgment and insight were good.  Physical Findings: AIMS:  , ,  ,  ,    CIWA:    COWS:     Musculoskeletal: Strength & Muscle Tone: within normal limits Gait & Station: normal Patient leans: N/A   Psychiatric Specialty Exam:  Presentation  General Appearance:  Bizarre  Eye Contact: Good  Speech: Clear and Coherent  Speech Volume: Normal  Handedness:No data recorded  Mood and Affect  Mood: Anxious; Labile; Euphoric  Affect: Congruent   Thought Process  Thought Processes: Disorganized  Descriptions of Associations:Loose  Orientation:Partial  Thought Content:Illogical  History of Schizophrenia/Schizoaffective disorder:No  Duration of Psychotic Symptoms:No data recorded Hallucinations:No data recorded Ideas of Reference:None  Suicidal Thoughts:No data recorded Homicidal Thoughts:No data recorded  Sensorium  Memory: Immediate Poor; Recent Poor  Judgment: Impaired  Insight: Lacking   Executive Functions  Concentration: Poor  Attention Span: Poor  Recall: Poor  Fund of Knowledge: Poor  Language: Poor   Psychomotor  Activity  Psychomotor Activity:No data recorded  Assets  Assets: Resilience; Social Support; Housing; Financial Resources/Insurance   Sleep  Sleep:No data recorded   Physical Exam: Physical Exam Vitals and nursing note reviewed.  Constitutional:      Appearance: Normal appearance. She is normal weight.  Neurological:     General: No focal deficit present.     Mental Status: She is alert and oriented to person, place, and time.  Psychiatric:        Attention and Perception: Attention and perception normal.        Mood and Affect: Mood and affect normal.        Speech: Speech normal.        Behavior: Behavior normal. Behavior is cooperative.        Thought Content: Thought content normal.        Cognition  and Memory: Cognition and memory normal.        Judgment: Judgment normal.    Review of Systems  Constitutional: Negative.   HENT: Negative.    Eyes: Negative.   Respiratory: Negative.    Cardiovascular: Negative.   Gastrointestinal: Negative.   Genitourinary: Negative.   Musculoskeletal: Negative.   Skin: Negative.   Neurological: Negative.   Endo/Heme/Allergies: Negative.   Psychiatric/Behavioral: Negative.     Blood pressure 135/61, pulse (!) 58, temperature 98.1 F (36.7 C), temperature source Oral, resp. rate 16, height '5\' 4"'$  (1.626 m), weight 44.2 kg, SpO2 100 %. Body mass index is 16.74 kg/m.   Social History   Tobacco Use  Smoking Status Every Day   Packs/day: 2.00   Years: 43.00   Total pack years: 86.00   Types: Cigarettes  Smokeless Tobacco Never   Tobacco Cessation:  A prescription for an FDA-approved tobacco cessation medication was offered at discharge and the patient refused   Blood Alcohol level:  Lab Results  Component Value Date   [facility] <10 01/01/2023   ETH <10 35/57/3220    Metabolic Disorder Labs:  Lab Results  Component Value Date   HGBA1C 5.4 08/09/2022   MPG 108.28 08/09/2022   No results found for: "PROLACTIN" Lab  Results  Component Value Date   CHOL 182 08/09/2022   TRIG 106 08/09/2022   HDL 65 08/09/2022   CHOLHDL 2.8 08/09/2022   VLDL 21 08/09/2022   LDLCALC 96 08/09/2022    See Psychiatric Specialty Exam and Suicide Risk Assessment completed by Attending Physician prior to discharge.  Discharge destination:  Home  Is patient on multiple antipsychotic therapies at discharge:  No   Has Patient had three or more failed trials of antipsychotic monotherapy by history:  No  Recommended Plan for Multiple Antipsychotic Therapies: NA   Allergies as of 01/09/2023       Reactions   Prochlorperazine Maleate    Other reaction(s): Unknown   Ibuprofen Rash        Medication List     STOP taking these medications    lamoTRIgine 25 MG tablet Commonly known as: LAMICTAL       TAKE these medications      Indication  Albuterol Sulfate 108 (90 Base) MCG/ACT Aepb Commonly known as: ProAir RespiClick Inhale 2 puffs into the lungs every 6 (six) hours as needed.  Indication: Disease Involving Spasms of the Bronchus   alendronate 70 MG tablet Commonly known as: FOSAMAX Take 70 mg by mouth once a week. Take with a full glass of water on an empty stomach.    amLODipine 5 MG tablet Commonly known as: NORVASC Take 1 tablet (5 mg total) by mouth daily. Start taking on: January 10, 2023 What changed:  medication strength how much to take  Indication: High Blood Pressure Disorder   budesonide-formoterol 160-4.5 MCG/ACT inhaler Commonly known as: SYMBICORT Inhale 2 puffs into the lungs 2 (two) times daily.    citalopram 20 MG tablet Commonly known as: CELEXA Take 1 tablet (20 mg total) by mouth daily.  Indication: Behavioral Disorders associated with Dementia, Generalized Anxiety Disorder   cyanocobalamin 1000 MCG tablet Take 1 tablet (1,000 mcg total) by mouth daily.    divalproex 250 MG DR tablet Commonly known as: DEPAKOTE Take 1 tablet (250 mg total) by mouth 2 (two) times  daily at 8 am and 4 pm.  Indication: Manic-Depression, Manic Phase of Manic-Depression   memantine 5 MG tablet Commonly known as: NAMENDA Take  1 tablet (5 mg total) by mouth at bedtime.  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    QUEtiapine 50 MG tablet Commonly known as: SEROQUEL Take 3 tablets (150 mg total) by mouth at bedtime. What changed: Another medication with the same name was removed. Continue taking this medication, and follow the directions you see here.  Indication: Behavioral Disorders associated with Dementia, Manic Phase of Manic-Depression   terbinafine 250 MG tablet Commonly known as: LAMISIL Take 250 mg by mouth daily.         Follow-up Information     [facility] Follow up on 02/12/2023.   Specialty: Behavioral Health Why: You have a follow up appointment scheduled for Thursday, February 15th at 11am with Dr. Modesta Messing. Thanks! Contact information: [place] [phone]                Follow-up recommendations: Dr. Modesta Messing    Signed: Parks Ranger, DO 01/09/2023, 10:33 AM

## 2023-01-09 NOTE — Progress Notes (Signed)
  [facility] Adult Case Management Discharge Plan :  Will you be returning to the same living situation after discharge:  Yes,  pt will be retuning home At discharge, do you have transportation home?: Yes,  pt's spouse will be providing transportation Do you have the ability to pay for your medications: Yes,  pt has [facility]  Release of information consent forms completed and in the chart;  Patient's signature needed at discharge.  Patient to Follow up at:  Follow-up Information     [facility] Follow up on 02/12/2023.   Specialty: Behavioral Health Why: You have a follow up appointment scheduled for Thursday, February 15th at 11am with Dr. Modesta Messing. Thanks! Contact information: [place] [phone]                Next level of care provider has access to [place] and Suicide Prevention discussed: Yes,  SPE completed with pt's husband     Has patient been referred to the Quitline?: Yes, faxed on 01/09/23  Patient has been referred for addiction treatment: N/A  Yvonne Pitts, [name] 01/09/2023, 9:49 AM

## 2023-01-09 NOTE — BHH Group Notes (Signed)
[place] Group Notes:  (Nursing/MHT/Case Management/Adjunct)  Date:  01/09/2023  Time:  1:25 PM  Type of Therapy:   Hand/Eye Coordination skills  Participation Level:  Active  Participation Quality:  Appropriate  Affect:  Appropriate  Cognitive:  Alert  Insight:  Appropriate  Engagement in Group:  Engaged  Modes of Intervention:  Activity  Summary of Progress/Problems:  Ileene Musa 01/09/2023, 1:25 PM

## 2023-01-09 NOTE — BHH Suicide Risk Assessment (Signed)
[facility] Discharge Suicide Risk Assessment   Principal Problem: Bipolar affective disorder, currently manic, moderate ([place]) Discharge Diagnoses: Principal Problem:   Bipolar affective disorder, currently manic, moderate (HCC) Active Problems:   Tobacco abuse   Leukocytosis   Total Time spent with patient: 15 minutes  Musculoskeletal: Strength & Muscle Tone: within normal limits Gait & Station: normal Patient leans: N/A  Psychiatric Specialty Exam  Presentation  General Appearance:  Bizarre  Eye Contact: Good  Speech: Clear and Coherent  Speech Volume: Normal  Handedness:No data recorded  Mood and Affect  Mood: Anxious; Labile; Euphoric  Duration of Depression Symptoms: Greater than two weeks  Affect: Congruent   Thought Process  Thought Processes: Disorganized  Descriptions of Associations:Loose  Orientation:Partial  Thought Content:Illogical  History of Schizophrenia/Schizoaffective disorder:No  Duration of Psychotic Symptoms:No data recorded Hallucinations:No data recorded Ideas of Reference:None  Suicidal Thoughts:No data recorded Homicidal Thoughts:No data recorded  Sensorium  Memory: Immediate Poor; Recent Poor  Judgment: Impaired  Insight: Lacking   Executive Functions  Concentration: Poor  Attention Span: Poor  Recall: Poor  Fund of Knowledge: Poor  Language: Poor   Psychomotor Activity  Psychomotor Activity:No data recorded  Assets  Assets: Resilience; Social Support; Housing; Financial Resources/Insurance   Sleep  Sleep:No data recorded   Blood pressure 135/61, pulse (!) 58, temperature 98.1 F (36.7 C), temperature source Oral, resp. rate 16, height '5\' 4"'$  (1.626 m), weight 44.2 kg, SpO2 100 %. Body mass index is 16.74 kg/m.  Mental Status Per Nursing Assessment::   On Admission:  NA  Demographic Factors:  Age 67 or older and Caucasian  Loss Factors: NA  Historical Factors: NA  Risk Reduction  Factors:   Sense of responsibility to family, Living with another person, especially a relative, Positive social support, Positive therapeutic relationship, and Positive coping skills or problem solving skills  Continued Clinical Symptoms:  Bipolar Disorder:   Bipolar II  Cognitive Features That Contribute To Risk:  Closed-mindedness    Suicide Risk:  Minimal: No identifiable suicidal ideation.  Patients presenting with no risk factors but with morbid ruminations; may be classified as minimal risk based on the severity of the depressive symptoms   Follow-up [place] Follow up on 02/12/2023.   Specialty: Behavioral Health Why: You have a follow up appointment scheduled for Thursday, February 15th at 11am with Dr. Modesta Messing. Thanks! Contact information: [place] [phone]                Plan Of Care/Follow-up recommendations: Dr. Maris Berger, DO 01/09/2023, 10:24 AM

## 2023-01-09 NOTE — Progress Notes (Signed)
Patient ID: Yvonne Pitts, female   DOB: [birth year], 67 y.o.   MRN: [id number]  Patient was discharged from [facility] at approx. 1300 escorted by staff. Patient denies SI/HI/AVH. Discharge packet to include printed AVS, Suicide Risk Assessment, and Transition Record reviewed with patient. Belongings returned and patient verified receipt with signature. Suicide safety plan completed with a copy kept in chart.

## 2023-01-09 NOTE — Progress Notes (Signed)
Patient denies SI, HI, and AVH. She is animated and cooperative with assessment. She is observed interacting appropriately with staff and other patients on the unit. She is compliant with scheduled medications.  Patient has swollen lower extremities with pitting edema. Patient encouraged to elevate extremities. She denies pain in the area.

## 2023-01-12 ENCOUNTER — Other Ambulatory Visit
Admission: RE | Admit: 2023-01-12 | Discharge: 2023-01-12 | Disposition: A | Discharge status: Home / Self Care | Payer: Medicare HMO | Source: Ambulatory Visit | Attending: Psychiatry | Admitting: Psychiatry

## 2023-01-12 ENCOUNTER — Ambulatory Visit: Payer: Medicare HMO | Admitting: Psychiatry

## 2023-01-12 ENCOUNTER — Encounter: Payer: Self-pay | Admitting: Psychiatry

## 2023-01-12 VITALS — BP 121/63 | HR 102 | Temp 100.3°F | Ht 62.0 in | Wt 106.4 lb

## 2023-01-12 DIAGNOSIS — R4189 Other symptoms and signs involving cognitive functions and awareness: Secondary | ICD-10-CM | POA: Insufficient documentation

## 2023-01-12 DIAGNOSIS — R4689 Other symptoms and signs involving appearance and behavior: Secondary | ICD-10-CM

## 2023-01-12 DIAGNOSIS — R4182 Altered mental status, unspecified: Secondary | ICD-10-CM

## 2023-01-12 DIAGNOSIS — J189 Pneumonia, unspecified organism: Secondary | ICD-10-CM | POA: Diagnosis not present

## 2023-01-12 LAB — FOLATE: Folate: 9.3 ng/mL (ref >5.9)

## 2023-01-12 LAB — VITAMIN B12: Vitamin B-12: 964 pg/mL — ABNORMAL HIGH (ref 180–914)

## 2023-01-12 LAB — CBC
HCT: 32.5 % — ABNORMAL LOW (ref 36.0–46.0)
Hemoglobin: 11 g/dL — ABNORMAL LOW (ref 12.0–15.0)
MCH: 29.5 pg (ref 26.0–34.0)
MCHC: 33.8 g/dL (ref 30.0–36.0)
MCV: 87.1 fL (ref 80.0–100.0)
Platelets: 334 10*3/uL (ref 150–400)
RBC: 3.73 MIL/uL — ABNORMAL LOW (ref 3.87–5.11)
RDW: 14.9 % (ref 11.5–15.5)
WBC: 19.2 10*3/uL — ABNORMAL HIGH (ref 4.0–10.5)
nRBC: 0 % (ref 0.0–0.2)

## 2023-01-12 LAB — HEPATIC FUNCTION PANEL
ALT: 12 U/L (ref 0–44)
AST: 18 U/L (ref 15–41)
Albumin: 2.8 g/dL — ABNORMAL LOW (ref 3.5–5.0)
Alkaline Phosphatase: 59 U/L (ref 38–126)
Bilirubin, Direct: <0.1 mg/dL (ref 0.0–0.2)
Total Bilirubin: 0.2 mg/dL — ABNORMAL LOW (ref 0.3–1.2)
Total Protein: 6.9 g/dL (ref 6.5–8.1)

## 2023-01-12 NOTE — Progress Notes (Signed)
[facility] MD/PA/NP OP Progress Note  01/12/2023 2:57 PM Yvonne Pitts  MRN:  [id number]  Chief Complaint:  Chief Complaint  Patient presents with   Follow-up   HPI:  - According to the chart review, she was admitted in Jan for "manic behavior". "She tells me that she does not like her medications and stopped them a few weeks ago.  I asked her why and she said because "they are killing me."  She was discharged on Lamictal and Seroquel.  She is unable to be more specific on what the problem is.  Husband IVC her because of her not sleeping and being more agitated." - discharged medication: citalopram 20 mg daily, Depakote 250 mg twice a day, quetiapine 150 mg at night, memantine 5 mg at night   This is a follow-up appointment for altered mental status.  She states that she has been good since being discharged. She states that "you and dr. Manuella Ghazi will figure this out." She states that she thought her husband stole her family suggested saying law.  She thinks that he did not mean to that time. She finds admission to be helpful, and feels good that she made a friend in the unit. She feels comfortable being with him, stating that ring (wedding) is back (and smiles).  She has decrease in appetite.  She had insomnia 1 night.  Despite her high PHQ-9/GAD-7, she denies feeling depressed.  She denies SI.   Of note, she wrote plenty of comments in pain screening, including "Dr. Reatha Armour? And Dr. Manuella Ghazi will figure this all out. ..will help Juanda Crumble +  figure it out."  Juanda Crumble, her husband presents to the visit.  He states that she did not allow others to talk at all prior to the admission.  He thinks she has been doing better since discharged, although he was concerned as she slept the entire day after the discharge. It has been getting better. She also has decrease in appetite. He did not recognize she has fever until today. He denies safety concern/aggression. He makes sure that she takes medication every day.     Wt  Readings from Last 3 Encounters:  01/12/23 106 lb 6.4 oz (48.3 kg)  01/02/23 97 lb 8 oz (44.2 kg)  01/01/23 121 lb 4.1 oz (55 kg)     Visit Diagnosis:    ICD-10-CM   1. Altered mental status, unspecified altered mental status type  R41.82 Valproic acid level    CBC    Hepatic function panel    2. Cognitive and behavioral changes  R41.89 Vitamin B12   R46.89 Folate      Past Psychiatric History: Please see initial evaluation for full details. I have reviewed the history. No updates at this time.     Past Medical History:  Past Medical History:  Diagnosis Date   Bipolar 1 disorder ([place])    COPD (chronic obstructive pulmonary disease) ([place]) 11/03/2016   DDD (degenerative disc disease), lumbar    Polycythemia    Ruptured disc, cervical    Scoliosis     Past Surgical History:  Procedure Laterality Date   BREAST EXCISIONAL BIOPSY Right    years ago   DILATION AND CURETTAGE OF UTERUS     NECK SURGERY      Family Psychiatric History: Please see initial evaluation for full details. I have reviewed the history. No updates at this time.     Family History:  Family History  Problem Relation Age of Onset   Anxiety  disorder Brother    Breast cancer Maternal Aunt 40   Colon cancer Maternal Aunt    Bone cancer Paternal Aunt     Social History:  Social History   Socioeconomic History   Marital status: Married    Spouse name: Not on file   Number of children: 2   Years of education: Not on file   Highest education level: Some college, no degree  Occupational History   Not on file  Tobacco Use   Smoking status: Every Day    Packs/day: 2.00    Years: 43.00    Total pack years: 86.00    Types: Cigarettes   Smokeless tobacco: Never  Vaping Use   Vaping Use: Never used  Substance and Sexual Activity   Alcohol use: Not Currently    Comment: seldom maybe 2-3 times a year   Drug use: No   Sexual activity: Not Currently  Other Topics Concern   Not on file  Social  History Narrative   Not on file   Social Determinants of Health   Financial Resource Strain: Not on file  Food Insecurity: No Food Insecurity (01/02/2023)   Hunger Vital Sign    Worried About Running Out of Food in the Last Year: Never true    Ran Out of Food in the Last Year: Never true  Transportation Needs: No Transportation Needs (01/02/2023)   PRAPARE - Hydrologist (Medical): No    Lack of Transportation (Non-Medical): No  Physical Activity: Not on file  Stress: Not on file  Social Connections: Not on file    Allergies:  Allergies  Allergen Reactions   Prochlorperazine Maleate     Other reaction(s): Unknown   Ibuprofen Rash    Metabolic Disorder Labs: Lab Results  Component Value Date   HGBA1C 5.4 08/09/2022   MPG 108.28 08/09/2022   No results found for: "PROLACTIN" Lab Results  Component Value Date   CHOL 182 08/09/2022   TRIG 106 08/09/2022   HDL 65 08/09/2022   CHOLHDL 2.8 08/09/2022   VLDL 21 08/09/2022   LDLCALC 96 08/09/2022   Lab Results  Component Value Date   TSH 3.714 07/25/2022   TSH 0.886 07/18/2022    Therapeutic Level Labs: No results found for: "LITHIUM" No results found for: "VALPROATE" No results found for: "CBMZ"  Current Medications: Current Outpatient Medications  Medication Sig Dispense Refill   Albuterol Sulfate (PROAIR RESPICLICK) 829 (90 Base) MCG/ACT AEPB Inhale 2 puffs into the lungs every 6 (six) hours as needed. 2 each 3   alendronate (FOSAMAX) 70 MG tablet Take 70 mg by mouth once a week. Take with a full glass of water on an empty stomach.     amLODipine (NORVASC) 5 MG tablet Take 1 tablet (5 mg total) by mouth daily. 30 tablet 3   budesonide-formoterol (SYMBICORT) 160-4.5 MCG/ACT inhaler Inhale 2 puffs into the lungs 2 (two) times daily.     citalopram (CELEXA) 20 MG tablet Take 1 tablet (20 mg total) by mouth daily. 30 tablet 3   cyanocobalamin 1000 MCG tablet Take 1 tablet (1,000 mcg total)  by mouth daily. 30 tablet 3   divalproex (DEPAKOTE) 250 MG DR tablet Take 1 tablet (250 mg total) by mouth 2 (two) times daily at 8 am and 4 pm. 60 tablet 3   omeprazole (PRILOSEC) 20 MG capsule Take 20 mg by mouth daily.     QUEtiapine Fumarate (SEROQUEL) 50 MG tablet Take 3 tablets (150  mg total) by mouth at bedtime. 90 tablet 3   terbinafine (LAMISIL) 250 MG tablet Take 250 mg by mouth daily.     No current facility-administered medications for this visit.     Musculoskeletal: Strength & Muscle Tone: within normal limits Gait & Station: normal Patient leans: N/A  Psychiatric Specialty Exam: Review of Systems  Psychiatric/Behavioral:  Positive for sleep disturbance. Negative for agitation, behavioral problems, confusion, decreased concentration, dysphoric mood, hallucinations, self-injury and suicidal ideas. The patient is nervous/anxious. The patient is not hyperactive.   All other systems reviewed and are negative.   Blood pressure 121/63, pulse (!) 102, temperature 100.3 F (37.9 C), temperature source Oral, height '5\' 2"'$  (1.575 m), weight 106 lb 6.4 oz (48.3 kg), SpO2 (!) 89 %.Body mass index is 19.46 kg/m. Not in acute distress, slight tachypnea  General Appearance: Fairly Groomed  Eye Contact:  Good  Speech:  Clear and Coherent  Volume:  Normal  Mood:   better  Affect:  Appropriate, Congruent, and calmer  Thought Process:  Coherent, linear  Orientation:  Full (Time, Place, and Person)  Thought Content: Logical   Suicidal Thoughts:  No  Homicidal Thoughts:  No  Memory:  Immediate;   Good  Judgement:  Good  Insight:  Good  Psychomotor Activity:  Normal  Concentration:  Concentration: Good and Attention Span: Good  Recall:  Good  Fund of Knowledge: Good  Language: Good  Akathisia:  No  Handed:  Right  AIMS (if indicated): not done  Assets:  Communication Skills Desire for Improvement  ADL's:  Intact  Cognition: WNL  Sleep:  Poor   Screenings: AUDIT     Flowsheet Row Admission (Discharged) from 01/02/2023 in [facility] Admission (Discharged) from 08/08/2022 in [facility]  Alcohol Use Disorder Identification Test Final Score (AUDIT) 0 0      GAD-7    Flowsheet Row Office Visit from 12/16/2022 in [facility] Office Visit from 10/23/2022 in [facility] Office Visit from 08/28/2022 in [facility]  Total GAD-7 Score '7 4 2      '$ PHQ2-9    [facility] Office Visit from 12/16/2022 in [facility] Office Visit from 10/23/2022 in [facility] Office Visit from 08/28/2022 in [facility] [place]  PHQ-2 Total Score 2 0 0  PHQ-9 Total Score 7 -- --      Flowsheet Row Admission (Discharged) from 01/02/2023 in [facility] ED from 01/01/2023 in [facility] Admission (Discharged) from 08/08/2022 in [facility] No Risk No Risk No Risk        Assessment and Plan:  DEJUANA WEIST is a 68 y.o. year old female with a history of depression, psychosis, COPD, ongoing tobacco use, DDD, polycythemia, who is originally referred for aftercare visit. - she was admitted to [facility] in 07/2022. Per chart review, "67 year old woman who has been displaying increasingly bizarre agitated behaviors with disorganized thoughts at home which has been occurring for several weeks." This is after care visit of hospitalization last week.  1. Altered mental status, unspecified altered mental status type 2. Cognitive and behavioral changes # paranoia R/o cognitive disorder Both the patient and her husband reports improvement in paranoia, behavioral change since recent admission/medication adjustment.  Etiology of paranoia, behavior changes include neurocognitive  disorder.  Although she was diagnosed with bipolar disorder during the recent admission, she has  no known mood disorder except depression until age 71, which is very unusual presentation.  Will continue to monitor this while continuing Depakote for mood dysregulation, and quetiapine for paranoia.  Will obtain labs for monitoring.  # r.o neurocognitive disorder She had cognitive deficits and word-finding difficulty on previous visit, although it was not noticeable on today's evaluation.  Differential includes Lewy body dementia given fluctuation in her paranoia, although she does not have formed hallucinations/Parkinson syndromes, and  frontotemporal dementia. She has an upcoming appointment at [facility].  Will obtain labs to rule out medical health issues contributing to cognitive deficits.  Will discontinue memantine at this time given she does not meet criteria for major neurocognitive disorder.  Both the patient and her husband verbalized understanding of this.   2. Major depressive disorder in full remission, unspecified whether recurrent (HCC) Unchanged. She denies significant mood symptoms since the last visit.  Will continue current dose of citalopram to target depression and anxiety given her mood has been reportedly stable for many years except these recent episodes.   # desaturation # fever She has a new onset of fever with desaturation. Not in acute distress. She was advised to go to walk-in clinic and/or at least contact her PCP given her underlying COPD.   # weight loss She reports decrease in appetite, and there is a weight loss.  She was advised to evaluate this along with her symptoms above with her PCP.    Plan Continue citalopram 20 mg daily Continue Depakote 250 mg twice a day Continue quetiapine 150 mg at night  (EKG 409 msec 06/2022) Discontinue memantine 5 mg at night Obtain labs (VPA, LFT, CBC, folate, Vitamin B12) Next appointment: 2/15 at 6 AM for 30 mins, in  person  The patient demonstrates the following risk factors for suicide: Chronic risk factors for suicide include: psychiatric disorder of depression . Acute risk factors for suicide include: N/A. Protective factors for this patient include: positive social support, responsibility to others (children, family), coping skills, and hope for the future. Considering these factors, the overall suicide risk at this point appears to be low. Patient is appropriate for outpatient follow up.      Collaboration of Care: Collaboration of Care: Other reviewed notes in Epic  Patient/Guardian was advised Release of Information must be obtained prior to any record release in order to collaborate their care with an outside provider. Patient/Guardian was advised if they have not already done so to contact the registration department to sign all necessary forms in order for [place] to release information regarding their care.   Consent: Patient/Guardian gives verbal consent for treatment and assignment of benefits for services provided during this visit. Patient/Guardian expressed understanding and agreed to proceed.    The duration of the time spent on the following activities on the date of the encounter was 45 minutes.   Preparing to see the patient (e.g., review of test, records)  Obtaining and/or reviewing separately obtained history  Performing a medically necessary exam and/or evaluation  Counseling and educating the patient/family/caregiver  Ordering medications, tests, or procedures  Documenting clinical information in the electronic or paper health record  Norman Clay, MD 01/12/2023, 2:57 PM

## 2023-01-12 NOTE — Addendum Note (Signed)
Addended by: Norman Clay on: 01/12/2023 05:29 PM   Modules accepted: Orders

## 2023-01-13 NOTE — Progress Notes (Signed)
Could you contact the patient regarding the blood test restuls. WBC is significantly higher than normal range, which is likely related to some infection given her low Sat and fever. I would recommend she is seen by her PCP or walk in clinic as discussed yesterday.   Depakote level is pending, fyi.

## 2023-01-14 ENCOUNTER — Inpatient Hospital Stay
Admission: EM | Admit: 2023-01-14 | Discharge: 2023-01-20 | DRG: 193 | Disposition: A | Discharge status: Home Health | Payer: Medicare HMO | Attending: Internal Medicine | Admitting: Internal Medicine

## 2023-01-14 ENCOUNTER — Emergency Department: Payer: Medicare HMO

## 2023-01-14 ENCOUNTER — Other Ambulatory Visit: Payer: Self-pay

## 2023-01-14 DIAGNOSIS — F3112 Bipolar disorder, current episode manic without psychotic features, moderate: Secondary | ICD-10-CM | POA: Diagnosis present

## 2023-01-14 DIAGNOSIS — Z888 Allergy status to other drugs, medicaments and biological substances status: Secondary | ICD-10-CM

## 2023-01-14 DIAGNOSIS — Y92009 Unspecified place in unspecified non-institutional (private) residence as the place of occurrence of the external cause: Secondary | ICD-10-CM | POA: Diagnosis not present

## 2023-01-14 DIAGNOSIS — I10 Essential (primary) hypertension: Secondary | ICD-10-CM | POA: Diagnosis present

## 2023-01-14 DIAGNOSIS — J9601 Acute respiratory failure with hypoxia: Secondary | ICD-10-CM | POA: Diagnosis not present

## 2023-01-14 DIAGNOSIS — R5383 Other fatigue: Secondary | ICD-10-CM | POA: Diagnosis present

## 2023-01-14 DIAGNOSIS — Z818 Family history of other mental and behavioral disorders: Secondary | ICD-10-CM

## 2023-01-14 DIAGNOSIS — E876 Hypokalemia: Secondary | ICD-10-CM | POA: Diagnosis present

## 2023-01-14 DIAGNOSIS — I2510 Atherosclerotic heart disease of native coronary artery without angina pectoris: Secondary | ICD-10-CM | POA: Diagnosis present

## 2023-01-14 DIAGNOSIS — R0781 Pleurodynia: Secondary | ICD-10-CM

## 2023-01-14 DIAGNOSIS — Z79899 Other long term (current) drug therapy: Secondary | ICD-10-CM

## 2023-01-14 DIAGNOSIS — J440 Chronic obstructive pulmonary disease with acute lower respiratory infection: Secondary | ICD-10-CM | POA: Diagnosis present

## 2023-01-14 DIAGNOSIS — Z1152 Encounter for screening for COVID-19: Secondary | ICD-10-CM

## 2023-01-14 DIAGNOSIS — R531 Weakness: Secondary | ICD-10-CM | POA: Diagnosis not present

## 2023-01-14 DIAGNOSIS — J441 Chronic obstructive pulmonary disease with (acute) exacerbation: Secondary | ICD-10-CM

## 2023-01-14 DIAGNOSIS — Z800 Family history of malignant neoplasm of digestive organs: Secondary | ICD-10-CM

## 2023-01-14 DIAGNOSIS — S0031XA Abrasion of nose, initial encounter: Secondary | ICD-10-CM | POA: Diagnosis present

## 2023-01-14 DIAGNOSIS — J189 Pneumonia, unspecified organism: Principal | ICD-10-CM | POA: Diagnosis present

## 2023-01-14 DIAGNOSIS — J432 Centrilobular emphysema: Secondary | ICD-10-CM | POA: Diagnosis present

## 2023-01-14 DIAGNOSIS — K219 Gastro-esophageal reflux disease without esophagitis: Secondary | ICD-10-CM | POA: Diagnosis present

## 2023-01-14 DIAGNOSIS — Z803 Family history of malignant neoplasm of breast: Secondary | ICD-10-CM

## 2023-01-14 DIAGNOSIS — Z87891 Personal history of nicotine dependence: Secondary | ICD-10-CM | POA: Diagnosis not present

## 2023-01-14 DIAGNOSIS — F17210 Nicotine dependence, cigarettes, uncomplicated: Secondary | ICD-10-CM | POA: Diagnosis present

## 2023-01-14 DIAGNOSIS — M419 Scoliosis, unspecified: Secondary | ICD-10-CM | POA: Diagnosis present

## 2023-01-14 DIAGNOSIS — J9621 Acute and chronic respiratory failure with hypoxia: Secondary | ICD-10-CM | POA: Insufficient documentation

## 2023-01-14 DIAGNOSIS — W19XXXA Unspecified fall, initial encounter: Secondary | ICD-10-CM | POA: Diagnosis present

## 2023-01-14 DIAGNOSIS — Z720 Tobacco use: Secondary | ICD-10-CM | POA: Diagnosis present

## 2023-01-14 DIAGNOSIS — R7989 Other specified abnormal findings of blood chemistry: Secondary | ICD-10-CM | POA: Diagnosis present

## 2023-01-14 LAB — CBC
HCT: 30.8 % — ABNORMAL LOW (ref 36.0–46.0)
Hemoglobin: 10.2 g/dL — ABNORMAL LOW (ref 12.0–15.0)
MCH: 29.1 pg (ref 26.0–34.0)
MCHC: 33.1 g/dL (ref 30.0–36.0)
MCV: 87.7 fL (ref 80.0–100.0)
Platelets: 372 10*3/uL (ref 150–400)
RBC: 3.51 MIL/uL — ABNORMAL LOW (ref 3.87–5.11)
RDW: 15.1 % (ref 11.5–15.5)
WBC: 18.3 10*3/uL — ABNORMAL HIGH (ref 4.0–10.5)
nRBC: 0 % (ref 0.0–0.2)

## 2023-01-14 LAB — BASIC METABOLIC PANEL
Anion gap: 10 (ref 5–15)
BUN: 7 mg/dL — ABNORMAL LOW (ref 8–23)
CO2: 28 mmol/L (ref 22–32)
Calcium: 8.5 mg/dL — ABNORMAL LOW (ref 8.9–10.3)
Chloride: 94 mmol/L — ABNORMAL LOW (ref 98–111)
Creatinine, Ser: 0.49 mg/dL (ref 0.44–1.00)
GFR, Estimated: >60 mL/min (ref >60)
Glucose, Bld: 105 mg/dL — ABNORMAL HIGH (ref 70–99)
Potassium: 3.4 mmol/L — ABNORMAL LOW (ref 3.5–5.1)
Sodium: 132 mmol/L — ABNORMAL LOW (ref 135–145)

## 2023-01-14 LAB — URINALYSIS, ROUTINE W REFLEX MICROSCOPIC
Bacteria, UA: NONE SEEN
Bilirubin Urine: NEGATIVE
Glucose, UA: NEGATIVE mg/dL
Ketones, ur: NEGATIVE mg/dL
Leukocytes,Ua: NEGATIVE
Nitrite: NEGATIVE
Protein, ur: NEGATIVE mg/dL
Specific Gravity, Urine: 1.004 — ABNORMAL LOW (ref 1.005–1.030)
pH: 7 (ref 5.0–8.0)

## 2023-01-14 LAB — RESP PANEL BY RT-PCR (RSV, FLU A&B, COVID)  RVPGX2
Influenza A by PCR: NEGATIVE
Influenza B by PCR: NEGATIVE
Resp Syncytial Virus by PCR: NEGATIVE
SARS Coronavirus 2 by RT PCR: NEGATIVE

## 2023-01-14 LAB — PROCALCITONIN: Procalcitonin: 0.39 ng/mL

## 2023-01-14 LAB — LACTIC ACID, PLASMA
Lactic Acid, Venous: 0.7 mmol/L (ref 0.5–1.9)
Lactic Acid, Venous: 1 mmol/L (ref 0.5–1.9)

## 2023-01-14 LAB — PROTIME-INR
INR: 1.1 (ref 0.8–1.2)
Prothrombin Time: 13.7 seconds (ref 11.4–15.2)

## 2023-01-14 LAB — APTT: aPTT: 45 seconds — ABNORMAL HIGH (ref 24–36)

## 2023-01-14 MED ORDER — GUAIFENESIN-DM 100-10 MG/5ML PO SYRP
5.0000 mL | ORAL_SOLUTION | ORAL | Status: DC | PRN
  Administered 2023-01-15: 5 mL via ORAL
  Filled 2023-01-14 (×2): qty 10

## 2023-01-14 MED ORDER — VITAMIN B-12 1000 MCG PO TABS
1000.0000 ug | ORAL_TABLET | Freq: Every day | ORAL | Status: DC
  Administered 2023-01-14 – 2023-01-20 (×7): 1000 ug via ORAL
  Filled 2023-01-14: qty 1
  Filled 2023-01-14 (×2): qty 2
  Filled 2023-01-14 (×4): qty 1

## 2023-01-14 MED ORDER — MOMETASONE FURO-FORMOTEROL FUM 200-5 MCG/ACT IN AERO
2.0000 | INHALATION_SPRAY | Freq: Two times a day (BID) | RESPIRATORY_TRACT | Status: DC
  Administered 2023-01-14 – 2023-01-20 (×12): 2 via RESPIRATORY_TRACT
  Filled 2023-01-14: qty 8.8

## 2023-01-14 MED ORDER — DIVALPROEX SODIUM 250 MG PO DR TAB
250.0000 mg | DELAYED_RELEASE_TABLET | ORAL | Status: DC
  Administered 2023-01-14 – 2023-01-20 (×12): 250 mg via ORAL
  Filled 2023-01-14 (×13): qty 1

## 2023-01-14 MED ORDER — ONDANSETRON HCL 4 MG PO TABS: 4.0000 mg | ORAL_TABLET | Freq: Four times a day (QID) | ORAL | Status: DC | PRN

## 2023-01-14 MED ORDER — ACETAMINOPHEN 650 MG RE SUPP: 650.0000 mg | Freq: Four times a day (QID) | RECTAL | Status: DC | PRN

## 2023-01-14 MED ORDER — SODIUM CHLORIDE 0.9 % IV BOLUS
1000.0000 mL | Freq: Once | INTRAVENOUS | Status: AC
  Administered 2023-01-14: 1000 mL via INTRAVENOUS

## 2023-01-14 MED ORDER — SODIUM CHLORIDE 0.9 % IV SOLN: 500.0000 mg | INTRAVENOUS | Status: DC

## 2023-01-14 MED ORDER — ENOXAPARIN SODIUM 40 MG/0.4ML IJ SOSY
40.0000 mg | PREFILLED_SYRINGE | INTRAMUSCULAR | Status: DC
  Administered 2023-01-14 – 2023-01-19 (×6): 40 mg via SUBCUTANEOUS
  Filled 2023-01-14 (×6): qty 0.4

## 2023-01-14 MED ORDER — AMLODIPINE BESYLATE 5 MG PO TABS
5.0000 mg | ORAL_TABLET | Freq: Every day | ORAL | Status: DC
  Administered 2023-01-14 – 2023-01-20 (×5): 5 mg via ORAL
  Filled 2023-01-14 (×7): qty 1

## 2023-01-14 MED ORDER — SODIUM CHLORIDE 0.9 % IV SOLN
2.0000 g | Freq: Once | INTRAVENOUS | Status: AC
  Administered 2023-01-14: 2 g via INTRAVENOUS
  Filled 2023-01-14: qty 20

## 2023-01-14 MED ORDER — NICOTINE 21 MG/24HR TD PT24
21.0000 mg | MEDICATED_PATCH | Freq: Every day | TRANSDERMAL | Status: DC
  Administered 2023-01-14 – 2023-01-20 (×7): 21 mg via TRANSDERMAL
  Filled 2023-01-14 (×7): qty 1

## 2023-01-14 MED ORDER — CITALOPRAM HYDROBROMIDE 20 MG PO TABS
20.0000 mg | ORAL_TABLET | Freq: Every day | ORAL | Status: DC
  Administered 2023-01-14 – 2023-01-20 (×7): 20 mg via ORAL
  Filled 2023-01-14 (×7): qty 1

## 2023-01-14 MED ORDER — TERBINAFINE HCL 250 MG PO TABS
250.0000 mg | ORAL_TABLET | Freq: Every day | ORAL | Status: DC
  Administered 2023-01-15 – 2023-01-20 (×6): 250 mg via ORAL
  Filled 2023-01-14 (×7): qty 1

## 2023-01-14 MED ORDER — GUAIFENESIN ER 600 MG PO TB12
600.0000 mg | ORAL_TABLET | Freq: Two times a day (BID) | ORAL | Status: DC
  Administered 2023-01-14 – 2023-01-20 (×12): 600 mg via ORAL
  Filled 2023-01-14 (×12): qty 1

## 2023-01-14 MED ORDER — PANTOPRAZOLE SODIUM 40 MG PO TBEC
40.0000 mg | DELAYED_RELEASE_TABLET | Freq: Every day | ORAL | Status: DC
  Administered 2023-01-15 – 2023-01-20 (×6): 40 mg via ORAL
  Filled 2023-01-14 (×7): qty 1

## 2023-01-14 MED ORDER — SODIUM CHLORIDE 0.9 % IV SOLN
500.0000 mg | Freq: Once | INTRAVENOUS | Status: AC
  Administered 2023-01-14: 500 mg via INTRAVENOUS
  Filled 2023-01-14: qty 5

## 2023-01-14 MED ORDER — ALBUTEROL SULFATE (2.5 MG/3ML) 0.083% IN NEBU
2.5000 mg | INHALATION_SOLUTION | Freq: Four times a day (QID) | RESPIRATORY_TRACT | Status: DC
  Administered 2023-01-14 – 2023-01-15 (×4): 2.5 mg via RESPIRATORY_TRACT
  Filled 2023-01-14 (×4): qty 3

## 2023-01-14 MED ORDER — SODIUM CHLORIDE 0.9 % IV SOLN: 2.0000 g | INTRAVENOUS | Status: DC

## 2023-01-14 MED ORDER — QUETIAPINE FUMARATE 100 MG PO TABS
150.0000 mg | ORAL_TABLET | Freq: Every day | ORAL | Status: DC
  Administered 2023-01-14 – 2023-01-19 (×6): 150 mg via ORAL
  Filled 2023-01-14 (×6): qty 1.5

## 2023-01-14 MED ORDER — ACETAMINOPHEN 325 MG PO TABS
650.0000 mg | ORAL_TABLET | Freq: Four times a day (QID) | ORAL | Status: DC | PRN
  Administered 2023-01-15 – 2023-01-17 (×6): 650 mg via ORAL
  Filled 2023-01-14 (×8): qty 2

## 2023-01-14 MED ORDER — ONDANSETRON HCL 4 MG/2ML IJ SOLN
4.0000 mg | Freq: Four times a day (QID) | INTRAMUSCULAR | Status: DC | PRN
  Administered 2023-01-14: 4 mg via INTRAVENOUS
  Filled 2023-01-14: qty 2

## 2023-01-14 MED ORDER — POTASSIUM CHLORIDE CRYS ER 20 MEQ PO TBCR
40.0000 meq | EXTENDED_RELEASE_TABLET | Freq: Once | ORAL | Status: AC
  Administered 2023-01-14: 40 meq via ORAL
  Filled 2023-01-14: qty 2

## 2023-01-14 MED ORDER — SODIUM CHLORIDE 0.9 % IV SOLN
500.0000 mg | INTRAVENOUS | Status: DC
  Administered 2023-01-15: 500 mg via INTRAVENOUS
  Filled 2023-01-14 (×2): qty 5

## 2023-01-14 MED ORDER — SODIUM CHLORIDE 0.9 % IV SOLN
2.0000 g | INTRAVENOUS | Status: AC
  Administered 2023-01-15 – 2023-01-19 (×5): 2 g via INTRAVENOUS
  Filled 2023-01-14 (×2): qty 2
  Filled 2023-01-14: qty 20
  Filled 2023-01-14 (×2): qty 2

## 2023-01-14 NOTE — Assessment & Plan Note (Signed)
Continue PPI

## 2023-01-14 NOTE — Assessment & Plan Note (Addendum)
Replaced. °

## 2023-01-14 NOTE — ED Triage Notes (Addendum)
Pt to ED via POV from [facility]. Pt was seen at [facility] for abnormal WBC 19.2 and unwitnessed fall. Pt fell forawrd on wood floor. Pt has abrasions to bridge of nose and above lip. Pt with hx COPD. RA sats 92%. Pt also reports cough x2 weeks and generally feeling unwell.

## 2023-01-14 NOTE — H&P (Signed)
History and Physical    Patient: Yvonne Pitts XFG:[id number] DOB: [birth year] DOA: 01/14/2023 DOS: the patient was seen and examined on 01/14/2023 PCP: Tracie Harrier, MD  Patient coming from: Home  Chief Complaint:  Chief Complaint  Patient presents with   Fall   HPI: Yvonne Pitts is a 67 y.o. female with medical history significant for bipolar disorder, nicotine dependence, hypertension, CAD, COPD who was brought into the hospital by her husband at the request of her psychiatrist. Patient was discharged from the [facility] psych unit after hospitalization for 1 week for manic behavior and was discharged home on 01/09/23.  Patient's husband states that she did well until 3 days prior to this admission when she developed chills at home, decreased appetite and increased lethargy.  He took her to follow-up with the psychiatrist to get her Depakote level checked.  He states that the psychiatrist contacted them prior to her admission and told them she had a high white count and needed to follow-up with her primary care provider. He took the patient to urgent care on the morning of admission to the ER for further evaluation. Patient has had a cough productive of greenish phlegm for several days and has had fever and chills at home.  She also complains of pleuritic chest pain. Patient fell on the day of admission while getting ready to go to the urgent care center.  She denied feeling dizzy or lightheaded and was trying to fix her pant legs when she face planted.  She has a bruise to her nasal bridge and upper lip. She denies having any nausea, no vomiting, no abdominal pain, no changes in her bowel habits, no headache, no blurred vision, no focal deficit. Abnormal labs include a potassium of 3.4, white count of 18.3, sodium of 132 Chest x-ray reviewed by me shows patchy bilateral lower lobe airspace disease concerning for multilobar pneumonia. Patient received a dose of Rocephin and Zithromax and  will be admitted to the hospital for further evaluation   Review of Systems: As mentioned in the history of present illness. All other systems reviewed and are negative. Past Medical History:  Diagnosis Date   Bipolar 1 disorder ([place])    COPD (chronic obstructive pulmonary disease) ([place]) 11/03/2016   DDD (degenerative disc disease), lumbar    Polycythemia    Ruptured disc, cervical    Scoliosis    Past Surgical History:  Procedure Laterality Date   BREAST EXCISIONAL BIOPSY Right    years ago   DILATION AND CURETTAGE OF UTERUS     NECK SURGERY     Social History:  reports that she has been smoking cigarettes. She has a 86.00 pack-year smoking history. She has never used smokeless tobacco. She reports that she does not currently use alcohol. She reports that she does not use drugs.  Allergies  Allergen Reactions   Prochlorperazine Maleate     Other reaction(s): Unknown   Ibuprofen Rash    Family History  Problem Relation Age of Onset   Anxiety disorder Brother    Breast cancer Maternal Aunt 40   Colon cancer Maternal Aunt    Bone cancer Paternal Aunt     Prior to Admission medications   Medication Sig Start Date End Date Taking? Authorizing Provider  Albuterol Sulfate (PROAIR RESPICLICK) 967 (90 Base) MCG/ACT AEPB Inhale 2 puffs into the lungs every 6 (six) hours as needed. 12/31/18  Yes Tyler Pita, MD  alendronate (FOSAMAX) 70 MG tablet Take 70 mg by  mouth once a week. Take with a full glass of water on an empty stomach.   Yes [provider]  amLODipine (NORVASC) 5 MG tablet Take 1 tablet (5 mg total) by mouth daily. 01/10/23  Yes Parks Ranger, DO  budesonide-formoterol [facility]) 160-4.5 MCG/ACT inhaler Inhale 2 puffs into the lungs 2 (two) times daily. 02/28/22  Yes [provider]  citalopram (CELEXA) 20 MG tablet Take 1 tablet (20 mg total) by mouth daily. 01/09/23  Yes Parks Ranger, DO  cyanocobalamin 1000 MCG tablet Take 1  tablet (1,000 mcg total) by mouth daily. 07/25/22  Yes Nicole Kindred A, DO  divalproex (DEPAKOTE) 250 MG DR tablet Take 1 tablet (250 mg total) by mouth 2 (two) times daily at 8 am and 4 pm. 01/09/23  Yes Parks Ranger, DO  omeprazole (PRILOSEC) 20 MG capsule Take 20 mg by mouth daily.   Yes [provider]  QUEtiapine Fumarate (SEROQUEL) 50 MG tablet Take 3 tablets (150 mg total) by mouth at bedtime. 01/09/23  Yes Parks Ranger, DO  terbinafine (LAMISIL) 250 MG tablet Take 250 mg by mouth daily.   Yes [provider]    Physical Exam: Vitals:   01/14/23 1031 01/14/23 1200 01/14/23 1230  BP: (!) 125/50 (!) 115/52 (!) 119/54  Pulse: 74 72 71  Resp: '18 20 20  '$ Temp: 97.9 F (36.6 C)    TempSrc: Oral    SpO2: 93% 95% 93%   Physical Exam Vitals and nursing note reviewed.  Constitutional:      Appearance: Normal appearance.     Comments: Thin and frail  HENT:     Head: Normocephalic and atraumatic.     Nose:     Comments: Bruised nasal bridge    Mouth/Throat:     Mouth: Mucous membranes are dry.  Eyes:     Conjunctiva/sclera: Conjunctivae normal.  Cardiovascular:     Rate and Rhythm: Normal rate and regular rhythm.  Pulmonary:     Effort: Pulmonary effort is normal.     Comments: Coarse breath sounds Abdominal:     General: Abdomen is flat. Bowel sounds are normal.     Palpations: Abdomen is soft.  Musculoskeletal:        General: Normal range of motion.     Cervical back: Normal range of motion and neck supple.  Skin:    General: Skin is warm and dry.  Neurological:     Mental Status: She is alert and oriented to person, place, and time.     Motor: Weakness present.  Psychiatric:        Mood and Affect: Mood normal.        Behavior: Behavior normal.     Data Reviewed: Relevant notes from primary care and specialist visits, past discharge summaries as available in EHR, including Care Everywhere. Prior diagnostic testing as  pertinent to current admission diagnoses Updated medications and problem lists for reconciliation ED course, including vitals, labs, imaging, treatment and response to treatment Triage notes, nursing and pharmacy notes and ED provider's notes Notable results as noted in HPI Labs reviewed.  Sodium 132, potassium 3.4, chloride 94, bicarb 28, glucose 105, BUN 7, creatinine 0.49, calcium 8.5, white count 18.3, hemoglobin 10.2, hematocrit 30.8, platelet count 372 CT scan of head and cervical spine showed no acute intracranial trauma. No acute fracture or traumatic listhesis of the cervical spine. Emphysema. Twelve-lead EKG reviewed by me shows normal sinus rhythm. Bi atrial enlargement There are no new  results to review at this time.  Assessment and Plan: * Multifocal pneumonia Patient presents for evaluation of a cough productive of greenish phlegm, pleuritic chest pain and shortness of breath Chest x-ray consistent with multifocal pneumonia Pro-Calcitonin level is elevated Place patient on Rocephin and Zithromax to complete a 5-day course of therapy  Bipolar affective disorder, currently manic, moderate ([place]) Recent hospitalization in the [facility] psych unit for manic behavior Continue Depakote, citalopram and Seroquel  Tobacco abuse Smoking cessation has been discussed with patient in detail We will place patient on a nicotine transdermal patch 21 mg daily  Hypokalemia Supplement potassium Check magnesium levels  Fall at home, initial encounter Patient is status post fall at home Placed on fall precautions PT evaluation  GERD (gastroesophageal reflux disease) Continue PPI  Centrilobular emphysema ([place]) Place patient on as needed bronchodilator therapy Continue inhaled steroids Oxygen supplementation as needed      Advance Care Planning:   Code Status: Full Code   Consults: Physical therapy  Family Communication: Greater than 50% of time was spent discussing patient's  condition and plan of care with her and her husband at the bedside.  All questions and concerns have been addressed.  They verbalized understanding and agree with the plan.  Severity of Illness: The appropriate patient status for this patient is INPATIENT. Inpatient status is judged to be reasonable and necessary in order to provide the required intensity of service to ensure the patient's safety. The patient's presenting symptoms, physical exam findings, and initial radiographic and laboratory data in the context of their chronic comorbidities is felt to place them at high risk for further clinical deterioration. Furthermore, it is not anticipated that the patient will be medically stable for discharge from the hospital within 2 midnights of admission.   * I certify that at the point of admission it is my clinical judgment that the patient will require inpatient hospital care spanning beyond 2 midnights from the point of admission due to high intensity of service, high risk for further deterioration and high frequency of surveillance required.*  Author: Collier Bullock, MD 01/14/2023 3:51 PM  For on call review [email or web address].

## 2023-01-14 NOTE — Assessment & Plan Note (Signed)
Recent hospitalization in the [facility] psych unit for manic behavior Continue Depakote, citalopram and Seroquel

## 2023-01-14 NOTE — ED Notes (Addendum)
Pt attempted to eat a bite of her ice cream and began coughing. Pts oxygen saturation began to drop, so this tech was instructed to increase the pts oxygen from 2 lpm to 3-4 lpm [name]. Pt still coughing and spitting up yellow mucous at this time. Tech at bedside monitoring pt. Pts oxygen sitting around 92 on 4 lpm Yvonne Pitts

## 2023-01-14 NOTE — Assessment & Plan Note (Deleted)
Place patient on as needed bronchodilator therapy Continue inhaled steroids Oxygen supplementation as needed

## 2023-01-14 NOTE — Assessment & Plan Note (Addendum)
Completed Rocephin and Zithromax 5 days.  Switched over to Augmentin for 2 more days.

## 2023-01-14 NOTE — Assessment & Plan Note (Deleted)
Patient is status post fall at home Placed on fall precautions PT evaluation

## 2023-01-14 NOTE — ED Provider Notes (Addendum)
[facility] Provider Note    Event Date/Time   First MD Initiated Contact with Patient 01/14/23 1151     (approximate)   History   Fall   HPI  Yvonne Pitts is a 67 y.o. female  with pmh bipolar disorder, hypertension, GERD, COPD who presents after a fall.  Patient is accompanied by her husband.  They note that she was discharged from inpatient psych on Friday.  She slept all day Saturday and then since then had been doing okay until Monday when she started having fevers as high as 101 at home as well as decreased appetite and cough.  Patient endorses pain in her chest with coughing as well as some mild dyspnea.  She has decreased appetite.  Husband notes that her mental status has overall been good and that she has been doing okay from a psychiatric standpoint.  She saw her psychiatrist on 1/15 who drew labs to monitor her antipsychotics and she was notified that she had a leukocytosis and should follow-up with her PCP.  She saw her primary doctor today and they referred her to the ED.  Today patient was getting ready to go to the appointment bent down to put her shoes on and then fell forward hitting her head.  Husband did not witness it but he was in the room next-door.  She does not think she lost consciousness, and is not exactly sure why she fell.  Patient was recently admitted to [place] for manic behavior discharged 5 days ago, after she stopped taking her medications.  Restarted on her psych meds and improved and was able to be discharged home.     Past Medical History:  Diagnosis Date   Bipolar 1 disorder ([place])    COPD (chronic obstructive pulmonary disease) ([place]) 11/03/2016   DDD (degenerative disc disease), lumbar    Polycythemia    Ruptured disc, cervical    Scoliosis     Patient Active Problem List   Diagnosis Date Noted   Bipolar affective disorder, currently manic, moderate ([place]) 01/01/2023   Leukocytosis 07/22/2022   Tobacco abuse  07/19/2022   GERD (gastroesophageal reflux disease) 07/18/2022   Essential hypertension 07/18/2022   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/03/2020   COPD, moderate (HCC) 11/03/2016     Physical Exam  Triage Vital Signs: ED Triage Vitals [01/14/23 1031]  Enc Vitals Group     BP (!) 125/50     Pulse Rate 74     Resp 18     Temp 97.9 F (36.6 C)     Temp Source Oral     SpO2 93 %     Weight      Height      Head Circumference      Peak Flow      Pain Score 2     Pain Loc      Pain Edu?      Excl. in [place]?     Most recent vital signs: Vitals:   01/14/23 1200 01/14/23 1230  BP: (!) 115/52 (!) 119/54  Pulse: 72 71  Resp: 20 20  Temp:    SpO2: 95% 93%     General: Awake, no distress. Thin, dry MM CV:  Good peripheral perfusion. No edema Resp:  Normal effort.  Patient has frequent cough with lung auscultation, no obvious wheezing Abd:  No distention.  Soft nontender Neuro:  Awake, Alert, Oriented x 3  Other:  Patient's thought process is linear, normal speech, calm and cooperative Superficial abrasion over the nasal bridge, small abrasion above the upper lip ED Results / Procedures / Treatments  Labs (all labs ordered are listed, but only abnormal results are displayed) Labs Reviewed  BASIC METABOLIC PANEL - Abnormal; Notable for the following components:      Result Value   Sodium 132 (*)    Potassium 3.4 (*)    Chloride 94 (*)    Glucose, Bld 105 (*)    BUN 7 (*)    Calcium 8.5 (*)    All other components within normal limits  CBC - Abnormal; Notable for the following components:   WBC 18.3 (*)    RBC 3.51 (*)    Hemoglobin 10.2 (*)    HCT 30.8 (*)    All other components within normal limits  RESP PANEL BY RT-PCR (RSV, FLU A&B, COVID)  RVPGX2  CULTURE, BLOOD (ROUTINE X 2)  CULTURE, BLOOD (ROUTINE X 2)  URINALYSIS, ROUTINE W REFLEX MICROSCOPIC  LACTIC ACID, PLASMA  LACTIC ACID, PLASMA  PROCALCITONIN  PROTIME-INR  APTT   CBG MONITORING, ED     EKG  EKG interpretation performed by myself: NSR, nml axis, nml intervals, inverted T wave V2-V3    RADIOLOGY I reviewed and interpreted the CT scan of the brain which does not show any acute intracranial process  Chest x-ray reviewed interpreted by myself shows multifocal pneumonia  PROCEDURES:  Critical Care performed: No  .Critical Care  Performed by: Rada Hay, MD Authorized by: Rada Hay, MD   Critical care provider statement:    Critical care time (minutes):  30   Critical care was time spent personally by me on the following activities:  Development of treatment plan with patient or surrogate, discussions with consultants, evaluation of patient's response to treatment, examination of patient, ordering and review of laboratory studies, ordering and review of radiographic studies, ordering and performing treatments and interventions, pulse oximetry, re-evaluation of patient's condition and review of old charts   The patient is on the cardiac monitor to evaluate for evidence of arrhythmia and/or significant heart rate changes.   MEDICATIONS ORDERED IN ED: Medications  sodium chloride 0.9 % bolus 1,000 mL (has no administration in time range)  cefTRIAXone (ROCEPHIN) 2 g in sodium chloride 0.9 % 100 mL IVPB (has no administration in time range)  azithromycin (ZITHROMAX) 500 mg in sodium chloride 0.9 % 250 mL IVPB (has no administration in time range)  sodium chloride 0.9 % bolus 1,000 mL (1,000 mLs Intravenous New Bag/Given 01/14/23 1247)     IMPRESSION / MDM / ASSESSMENT AND PLAN / ED COURSE  I reviewed the triage vital signs and the nursing notes.                              Patient's presentation is most consistent with acute complicated illness / injury requiring diagnostic workup.  Differential diagnosis includes, but is not limited to, pneumonia, dehydration, viral illness including COVID-19, influenza, UTI, AKI,  electrolyte abnormality  The patient is a 67 year old female with bipolar disorder and COPD recently discharged from geropsych for manic behavior who presents because of fevers at home cough decreased appetite and a fall today.  Husband notes she has been more fatigued since leaving the hospital decreased appetite and has had fevers as high as 101 since Monday associated with a  cough.  Mental status however has been good she has been more stable psychiatrically.  Still her psychiatrist 2 days ago who ordered labs and she was found to have leukocytosis and told to follow-up with PCP.  Today for going to primary office patient had a fall after she was bending down to put her shoes on fell forward hitting her head unclear if she lost consciousness patient's not really sure why she fell.   On exam patient is thin mental status is reassuring.  She does look dry.  She has frequent cough no obvious wheezing difficult to perform thorough auscultation because of frequent coughing.  She does have a leukocytosis to about 18 here but is otherwise not meeting sepsis criteria.  Will obtain chest x-ray two-view as well as urinalysis and COVID test.  Will give a bolus of fluid.  Patient's chest x-ray shows multifocal pneumonia.  Sats are borderline with her leukocytosis and age she should be admitted.  COVID swab still pending.  I have added on blood cultures lactate procalcitonin.  Patient desatting to 87% on room air placed on 2 L nasal cannula.  Given her hypoxia she will require admission.  I have ordered ceftriaxone and azithromycin.  Will order second bolus of fluid.  Remainder of her labs and viral studies are pending.  FINAL CLINICAL IMPRESSION(S) / ED DIAGNOSES   Final diagnoses:  Multifocal pneumonia     Rx / DC Orders   ED Discharge Orders     None        Note:  This document was prepared using Dragon voice recognition software and may include unintentional dictation errors.   Rada Hay, MD 01/14/23 1401    Rada Hay, MD 01/14/23 [phone]

## 2023-01-14 NOTE — ED Notes (Signed)
Hospital dinner tray set on pts side table. Yvonne Pitts, and [place], EDT placed a female [place], per RN request. Pericare provided prior to insertion. Pt repositioned in bed sat up for nebulizer treatment.   Call bell within reach and bed in lowest position. Pt made aware that the purewick is for urine, and to press call bell in case she needed to defecate. Pt verbally expressed understanding.

## 2023-01-14 NOTE — Assessment & Plan Note (Addendum)
Nicotine patch.  Patient advised that she cannot smoke with oxygen.

## 2023-01-15 DIAGNOSIS — J189 Pneumonia, unspecified organism: Secondary | ICD-10-CM | POA: Diagnosis not present

## 2023-01-15 DIAGNOSIS — J441 Chronic obstructive pulmonary disease with (acute) exacerbation: Secondary | ICD-10-CM | POA: Diagnosis not present

## 2023-01-15 DIAGNOSIS — K219 Gastro-esophageal reflux disease without esophagitis: Secondary | ICD-10-CM

## 2023-01-15 DIAGNOSIS — E876 Hypokalemia: Secondary | ICD-10-CM

## 2023-01-15 DIAGNOSIS — Z720 Tobacco use: Secondary | ICD-10-CM | POA: Diagnosis not present

## 2023-01-15 DIAGNOSIS — F3112 Bipolar disorder, current episode manic without psychotic features, moderate: Secondary | ICD-10-CM

## 2023-01-15 DIAGNOSIS — J9601 Acute respiratory failure with hypoxia: Secondary | ICD-10-CM

## 2023-01-15 DIAGNOSIS — J9621 Acute and chronic respiratory failure with hypoxia: Secondary | ICD-10-CM | POA: Insufficient documentation

## 2023-01-15 DIAGNOSIS — R531 Weakness: Secondary | ICD-10-CM

## 2023-01-15 LAB — CBC
HCT: 29.7 % — ABNORMAL LOW (ref 36.0–46.0)
Hemoglobin: 9.8 g/dL — ABNORMAL LOW (ref 12.0–15.0)
MCH: 29.3 pg (ref 26.0–34.0)
MCHC: 33 g/dL (ref 30.0–36.0)
MCV: 88.9 fL (ref 80.0–100.0)
Platelets: 377 10*3/uL (ref 150–400)
RBC: 3.34 MIL/uL — ABNORMAL LOW (ref 3.87–5.11)
RDW: 15.6 % — ABNORMAL HIGH (ref 11.5–15.5)
WBC: 18.2 10*3/uL — ABNORMAL HIGH (ref 4.0–10.5)
nRBC: 0 % (ref 0.0–0.2)

## 2023-01-15 LAB — BASIC METABOLIC PANEL
Anion gap: 9 (ref 5–15)
BUN: 5 mg/dL — ABNORMAL LOW (ref 8–23)
CO2: 27 mmol/L (ref 22–32)
Calcium: 7.9 mg/dL — ABNORMAL LOW (ref 8.9–10.3)
Chloride: 100 mmol/L (ref 98–111)
Creatinine, Ser: 0.37 mg/dL — ABNORMAL LOW (ref 0.44–1.00)
GFR, Estimated: >60 mL/min (ref >60)
Glucose, Bld: 104 mg/dL — ABNORMAL HIGH (ref 70–99)
Potassium: 3.4 mmol/L — ABNORMAL LOW (ref 3.5–5.1)
Sodium: 136 mmol/L (ref 135–145)

## 2023-01-15 LAB — LEGIONELLA PNEUMOPHILA SEROGP 1 UR AG: L. pneumophila Serogp 1 Ur Ag: NEGATIVE

## 2023-01-15 MED ORDER — METHYLPREDNISOLONE SODIUM SUCC 40 MG IJ SOLR
40.0000 mg | Freq: Every day | INTRAMUSCULAR | Status: DC
  Administered 2023-01-15 – 2023-01-20 (×6): 40 mg via INTRAVENOUS
  Filled 2023-01-15 (×7): qty 1

## 2023-01-15 MED ORDER — POTASSIUM CHLORIDE CRYS ER 20 MEQ PO TBCR
40.0000 meq | EXTENDED_RELEASE_TABLET | Freq: Once | ORAL | Status: AC
  Administered 2023-01-15: 40 meq via ORAL
  Filled 2023-01-15: qty 2

## 2023-01-15 MED ORDER — ALBUTEROL SULFATE (2.5 MG/3ML) 0.083% IN NEBU
2.5000 mg | INHALATION_SOLUTION | Freq: Four times a day (QID) | RESPIRATORY_TRACT | Status: DC
  Administered 2023-01-15 – 2023-01-16 (×4): 2.5 mg via RESPIRATORY_TRACT
  Filled 2023-01-15 (×5): qty 3

## 2023-01-15 NOTE — Care Management Important Message (Signed)
Important Message  Patient Details  Name: Yvonne Pitts MRN: [id number] Date of Birth: 09-Dec-1956   Medicare Important Message Given:  N/A - LOS <3 / Initial given by admissions     Dannette Barbara 01/15/2023, 1:15 PM

## 2023-01-15 NOTE — Progress Notes (Signed)
Progress Note   Patient: Yvonne Pitts TFT:[id number] DOB: [birth year] DOA: 01/14/2023     1 DOS: the patient was seen and examined on 01/15/2023   Brief hospital course: 67 y.o. female with medical history significant for bipolar disorder, nicotine dependence, hypertension, CAD, COPD who was brought into the hospital by her husband at the request of her psychiatrist. Patient was discharged from the [facility] psych unit after hospitalization for 1 week for manic behavior and was discharged home on 01/09/23.  Patient's husband states that she did well until 3 days prior to this admission when she developed chills at home, decreased appetite and increased lethargy.  He took her to follow-up with the psychiatrist to get her Depakote level checked.  He states that the psychiatrist contacted them prior to her admission and told them she had a high white count and needed to follow-up with her primary care provider. He took the patient to urgent care on the morning of admission to the ER for further evaluation. Patient has had a cough productive of greenish phlegm for several days and has had fever and chills at home.  She also complains of pleuritic chest pain. Patient fell on the day of admission while getting ready to go to the urgent care center.  She denied feeling dizzy or lightheaded and was trying to fix her pant legs when she face planted.  She has a bruise to her nasal bridge and upper lip. She denies having any nausea, no vomiting, no abdominal pain, no changes in her bowel habits, no headache, no blurred vision, no focal deficit. Abnormal labs include a potassium of 3.4, white count of 18.3, sodium of 132 Chest x-ray shows patchy bilateral lower lobe airspace disease concerning for multilobar pneumonia. Patient received a dose of Rocephin and Zithromax.  1/18.  Passed swallow evaluation.  Started on Solu-Medrol for bronchospasm.  Assessment and Plan: * Multifocal pneumonia Continue Rocephin and  Zithromax.  Acute respiratory failure with hypoxia ([place]) Patient had a pulse ox of 85% on 4 L.  She was increased to 6 L overnight.  I dialed her down to 4 L today with good saturation.  Continue to taper oxygen.  Would like to get off oxygen prior to disposition.  COPD with acute exacerbation (HCC) And Solu-Medrol.  Continue nebulizer treatments and inhalers.  Tobacco abuse Nicotine patch  Bipolar affective disorder, currently manic, moderate (HCC) Recent hospitalization in the [facility] psych unit for manic behavior Continue Depakote, citalopram and Seroquel  Weakness Patient had fall at home with a abrasion on her nose.  PT recommending home health.  Hypokalemia Replace potassium again today.  GERD (gastroesophageal reflux disease) Continue PPI       Subjective: Patient feeling a little bit better today.  Still with some cough and shortness of breath.  Admitted with pneumonia.  Physical Exam: Vitals:   01/15/23 0800 01/15/23 0928 01/15/23 1030 01/15/23 1209  BP: (!) 112/47   (!) 119/55  Pulse: 62  85 74  Resp: '16  20 18  '$ Temp:  98 F (36.7 C)  97.6 F (36.4 C)  TempSrc:  Oral  Oral  SpO2: 96%  94% 97%  Weight:      Height:       Physical Exam HENT:     Head: Normocephalic.     Mouth/Throat:     Pharynx: No oropharyngeal exudate.  Eyes:     General: Lids are normal.     Conjunctiva/sclera: Conjunctivae normal.  Cardiovascular:  Rate and Rhythm: Normal rate and regular rhythm.     Heart sounds: Normal heart sounds, S1 normal and S2 normal.  Pulmonary:     Breath sounds: Examination of the right-middle field reveals rhonchi. Examination of the left-middle field reveals rhonchi. Examination of the right-lower field reveals decreased breath sounds and rhonchi. Examination of the left-lower field reveals decreased breath sounds and rhonchi. Decreased breath sounds and rhonchi present. No wheezing or rales.  Abdominal:     Palpations: Abdomen is soft.      Tenderness: There is no abdominal tenderness.  Musculoskeletal:     Right lower leg: No swelling.     Left lower leg: No swelling.  Skin:    General: Skin is warm.     Findings: No rash.     Comments: Abrasion on the bridge of her nose.  Neurological:     Mental Status: She is alert and oriented to person, place, and time.     Data Reviewed: Potassium 3.4, creatinine 0.37, white blood cell count 18.2, hemoglobin 9.8  Family Communication: Updated husband on the phone  Disposition: Status is: Inpatient Remains inpatient appropriate because: Trying to get the patient off oxygen prior to disposition.  Adding Solu-Medrol today.  Planned Discharge Destination: Home with Home Health    Time spent: 28 minutes  Author: Loletha Grayer, MD 01/15/2023 1:46 PM  For on call review [email or web address].

## 2023-01-15 NOTE — Evaluation (Signed)
Physical Therapy Evaluation Patient Details Name: Yvonne Pitts MRN: [id number] DOB: October 10, 1956 Today's Date: 01/15/2023  History of Present Illness  Pt is a 67 y.o. female presenting to hospital 01/14/23 s/p fall (fell forward putting on shoes and hit her head); pt with fevers, cough, and decreased appetite.  Recent discharge from in patient psych 01/09/23 (for manic behavior).  Pt admitted with multifocal PNA, bipolar affective disorder, hypokalemia, and centrilobular emphysema.  PMH includes bipolar disorder, htn, COPD, ruptured cervical disc, scoliosis, neck sx.  Clinical Impression  Prior to hospital admission, pt was independent with functional mobility; lives with her husband and son in 1 level home.  No c/o pain during session; pt appearing pleasant and very happy during session.  Currently pt is modified independent with bed mobility; CGA with transfers using RW; and CGA with ambulation 20 feet with RW use.  Limited distance ambulating d/t pt's O2 sats (all on 4 L O2 via nasal cannula during sessions activities) decreased from 93% at rest to 87% with activity; O2 sats 92% at rest end of session.  When initially standing from bed pt reporting feeling like she was going to "pitch forward" so utilized RW which improved pt's balance and pt felt more stable.  Pt would benefit from skilled PT to address noted impairments and functional limitations (see below for any additional details).  Upon hospital discharge, pt would benefit from HHPT and SBA with all functional mobility at home for safety.    Recommendations for follow up therapy are one component of a multi-disciplinary discharge planning process, led by the attending physician.  Recommendations may be updated based on patient status, additional functional criteria and insurance authorization.  Follow Up Recommendations Home health PT      Assistance Recommended at Discharge Intermittent Supervision/Assistance  Patient can return home with  the following  A little help with walking and/or transfers;A little help with bathing/dressing/bathroom;Assistance with cooking/housework;Assist for transportation;Help with stairs or ramp for entrance    Equipment Recommendations Rolling walker (2 wheels)  Recommendations for Other Services  OT consult    Functional Status Assessment Patient has had a recent decline in their functional status and demonstrates the ability to make significant improvements in function in a reasonable and predictable amount of time.     Precautions / Restrictions Precautions Precautions: Fall Restrictions Weight Bearing Restrictions: No      Mobility  Bed Mobility Overal bed mobility: Modified Independent             General bed mobility comments: Semi-supine to/from sitting with HOB elevated without any noted difficulties.    Transfers Overall transfer level: Needs assistance Equipment used: Rolling walker (2 wheels) Transfers: Sit to/from Stand Sit to Stand: Min guard           General transfer comment: x2 trials standing from bed; vc's for UE placement    Ambulation/Gait Ambulation/Gait assistance: Min guard Gait Distance (Feet): 20 Feet Assistive device: Rolling walker (2 wheels) Gait Pattern/deviations: Step-through pattern Gait velocity: decreased     General Gait Details: steady with RW use  Stairs            Wheelchair Mobility    Modified Rankin (Stroke Patients Only)       Balance Overall balance assessment: Needs assistance Sitting-balance support: No upper extremity supported, Feet supported Sitting balance-Leahy Scale: Good Sitting balance - Comments: steady sitting reaching within BOS but pt feeling like she would "pitch forward" if leaning forward to stand   Standing balance support:  Bilateral upper extremity supported, During functional activity, Reliant on assistive device for balance Standing balance-Leahy Scale: Good Standing balance comment:  steady ambulating with RW use                             Pertinent Vitals/Pain Pain Assessment Pain Assessment: No/denies pain HR WFL during sessions activities.    Home Living Family/patient expects to be discharged to:: Private residence Living Arrangements: Children;Spouse/significant other ([name] and her son) Available Help at Discharge: Family;Available 24 hours/day Type of Home: House Home Access: Stairs to enter Entrance Stairs-Rails: None Entrance Stairs-Number of Steps: about 2 inch threshold to enter home Alternate Level Stairs-Number of Steps: 2 steps from den to [employer] with R railing Home Layout: Two level Home Equipment: None      Prior Function Prior Level of Function : Independent/Modified Independent             Mobility Comments: Pt reports no other recent falls.  No AD use baseline.       Hand Dominance        Extremity/Trunk Assessment   Upper Extremity Assessment Upper Extremity Assessment: Overall WFL for tasks assessed    Lower Extremity Assessment Lower Extremity Assessment: Generalized weakness    Cervical / Trunk Assessment Cervical / Trunk Assessment: Normal  Communication   Communication: No difficulties  Cognition Arousal/Alertness: Awake/alert Behavior During Therapy: WFL for tasks assessed/performed Overall Cognitive Status: Within Functional Limits for tasks assessed                                 General Comments: Pt appearing very pleasant and happy in general during session.        General Comments  Nursing cleared pt for participation in physical therapy.  Pt agreeable to PT session.    Exercises  Transfer and gait training with RW.   Assessment/Plan    PT Assessment Patient needs continued PT services  PT Problem List Decreased strength;Decreased activity tolerance;Decreased balance;Decreased mobility;Decreased knowledge of use of DME;Decreased knowledge of  precautions;Cardiopulmonary status limiting activity       PT Treatment Interventions DME instruction;Gait training;Stair training;Functional mobility training;Therapeutic activities;Therapeutic exercise;Balance training;Patient/family education    PT Goals (Current goals can be found in the Care Plan section)  Acute Rehab PT Goals Patient Stated Goal: to improve mobility and balance PT Goal Formulation: With patient Time For Goal Achievement: 01/29/23 Potential to Achieve Goals: Good    Frequency Min 2X/week     Co-evaluation               AM-PAC PT "6 Clicks" Mobility  Outcome Measure Help needed turning from your back to your side while in a flat bed without using bedrails?: None Help needed moving from lying on your back to sitting on the side of a flat bed without using bedrails?: A Little Help needed moving to and from a bed to a chair (including a wheelchair)?: A Little Help needed standing up from a chair using your arms (e.g., wheelchair or bedside chair)?: A Little Help needed to walk in hospital room?: A Little Help needed climbing 3-5 steps with a railing? : A Little 6 Click Score: 19    End of Session Equipment Utilized During Treatment: Gait belt;Oxygen (4 L O2 via nasal cannula) Activity Tolerance: Patient tolerated treatment well Patient left:  (sitting on edge of bed with SLP present  with pt (SLP to place bed alarm and set pt up when done with pt)) Nurse Communication: Mobility status;Precautions;Other (comment) (pt's O2 sats during session) PT Visit Diagnosis: Other abnormalities of gait and mobility (R26.89);Muscle weakness (generalized) (M62.81);History of falling (Z91.81)    Time: 3601-6580 PT Time Calculation (min) (ACUTE ONLY): 15 min   Charges:   PT Evaluation $PT Eval Low Complexity: 1 Low PT Treatments $Therapeutic Activity: 8-22 mins       Leitha Bleak, PT 01/15/23, 10:38 AM

## 2023-01-15 NOTE — Evaluation (Signed)
Clinical/Bedside Swallow Evaluation Patient Details  Name: Yvonne Pitts MRN: [id number] Date of Birth: 04-14-1956  Today's Date: 01/15/2023 Time: SLP Start Time (ACUTE ONLY): 0905 SLP Stop Time (ACUTE ONLY): 0925 SLP Time Calculation (min) (ACUTE ONLY): 20 min  Past Medical History:  Past Medical History:  Diagnosis Date   Bipolar 1 disorder ([place])    COPD (chronic obstructive pulmonary disease) ([place]) 11/03/2016   DDD (degenerative disc disease), lumbar    Polycythemia    Ruptured disc, cervical    Scoliosis    Past Surgical History:  Past Surgical History:  Procedure Laterality Date   BREAST EXCISIONAL BIOPSY Right    years ago   DILATION AND CURETTAGE OF UTERUS     NECK SURGERY     HPI:  Pt is a 67 y.o. female with medical history significant for bipolar disorder, nicotine dependence, hypertension, CAD, COPD who was brought into the hospital by her husband at the request of her psychiatrist.  Patient was discharged from the [facility] psych unit after hospitalization for 1 week for manic behavior and was discharged home on 01/09/23.  Patient's husband states that she did well until 3 days prior to this admission when she developed chills at home, decreased appetite and increased lethargy.  He took her to follow-up with the psychiatrist to get her Depakote level checked.  He states that the psychiatrist contacted them prior to her admission and told them she had a high white count and needed to follow-up with her primary care provider.  He took the patient to urgent care on the morning of admission to the ER for further evaluation.  Patient has had a cough productive of greenish phlegm for several days and has had fever and chills at home.  She also complains of pleuritic chest pain.  Patient fell on the day of admission while getting ready to go to the urgent care center.  She denied feeling dizzy or lightheaded and was trying to fix her pant legs when she face planted.  She has a bruise to  her nasal bridge and upper lip.  She denies having any nausea, no vomiting, no abdominal pain, no changes in her bowel habits, no headache, no blurred vision, no focal deficit.  Abnormal labs include a potassium of 3.4, white count of 18.3, sodium of 132  Chest x-ray reviewed by me shows patchy bilateral lower lobe airspace disease concerning for multilobar pneumonia.    Assessment / Plan / Recommendation  Clinical Impression  Pt appeared alert and cooperative throughout eval c/b conversing readily, correctly stating her name and birthdate, and actively participating in BSE. Pt sitting up on side of bed with PT present upon arrival. Pt remained sitting up on side of bed for duration of eval. On 4L/min via O2; SOB with exertion noted; afebrile; WBC elevated at 18.2. Oral mech exam completed exhibiting functional tongue/ lip strength/ROM for swallowing. Xerostomia noted with dry, cracked appearance to lingual body.   Trials of ice chips, thin chips, puree and solids administered. During oral phase, pt exhibited adequate anterior labial seal, good bolus control/manipulation, and timely AP transit. Adequate oral clearance achieved. During pharyngeal phase, pt exhibited no overt s/sx of aspiration. To palpation, pt appeared to swallow in a timely manner  with seemingly adequate laryngeal elevation. Noted mild shortness of breath in ~1-2 instances liekly d/t drinking/eating multiple sips/bites which seem consistent with current respiratory status.  Recommend regular diet w/ thin liquids, general aspiration precautions, tray set up/sitting up at meals. Reduced  distraction during meals and support feeding as needed. SLP will monitor progress given risk of aspiration in setting of PNA.      Aspiration Risk  Mild aspiration risk (d/t pneumonia/muscle weakness)    Diet Recommendation Recommend regular diet w/ thin liquids  Medication Administration: Whole meds with liquid    Other  Recommendations Recommended  Consults:  (N/A) Oral Care Recommendations: Oral care before and after PO;Oral care BID    Recommendations for follow up therapy are one component of a multi-disciplinary discharge planning process, led by the attending physician.  Recommendations may be updated based on patient status, additional functional criteria and insurance authorization.  Follow up Recommendations Follow physician's recommendations for discharge plan and follow up therapies         Functional Status Assessment Patient has had a recent decline in their functional status and demonstrates the ability to make significant improvements in function in a reasonable and predictable amount of time.  Frequency and Duration min 1 x/week  1 week       Prognosis Prognosis for Safe Diet Advancement: Fair Barriers to Reach Goals: Medication;Severity of deficits;Cognitive deficits      Swallow Study   General Date of Onset: 01/14/23 HPI: Pt is a 67 y.o. female with medical history significant for bipolar disorder, nicotine dependence, hypertension, CAD, COPD who was brought into the hospital by her husband at the request of her psychiatrist.  Patient was discharged from the [facility] psych unit after hospitalization for 1 week for manic behavior and was discharged home on 01/09/23.  Patient's husband states that she did well until 3 days prior to this admission when she developed chills at home, decreased appetite and increased lethargy.  He took her to follow-up with the psychiatrist to get her Depakote level checked.  He states that the psychiatrist contacted them prior to her admission and told them she had a high white count and needed to follow-up with her primary care provider.  He took the patient to urgent care on the morning of admission to the ER for further evaluation.  Patient has had a cough productive of greenish phlegm for several days and has had fever and chills at home.  She also complains of pleuritic chest pain.  Patient  fell on the day of admission while getting ready to go to the urgent care center.  She denied feeling dizzy or lightheaded and was trying to fix her pant legs when she face planted.  She has a bruise to her nasal bridge and upper lip.  She denies having any nausea, no vomiting, no abdominal pain, no changes in her bowel habits, no headache, no blurred vision, no focal deficit.  Abnormal labs include a potassium of 3.4, white count of 18.3, sodium of 132  Chest x-ray reviewed by me shows patchy bilateral lower lobe airspace disease concerning for multilobar pneumonia. Type of Study: Bedside Swallow Evaluation Previous Swallow Assessment: None Diet Prior to this Study: Regular;Thin liquids;IV Temperature Spikes Noted: No (WBC 18.2) Respiratory Status: Nasal cannula (4 L) History of Recent Intubation: No Behavior/Cognition: Alert;Cooperative;Pleasant mood;Requires cueing (Min verbal cues) Oral Cavity Assessment: Dry Oral Care Completed by SLP: No Oral Cavity - Dentition: Missing dentition;Adequate natural dentition Vision: Functional for self-feeding Self-Feeding Abilities: Able to feed self;Needs set up Patient Positioning: Upright in bed (Upright sitting on side of bed) Baseline Vocal Quality: Normal Volitional Cough: Strong Volitional Swallow: Able to elicit    Oral/Motor/Sensory Function Overall Oral Motor/Sensory Function: Within functional limits  Ice Chips Ice chips: Within functional limits Presentation: Self Fed;Spoon (x 5)   Thin Liquid Thin Liquid: Within functional limits Presentation: Cup;Self Fed;Straw (~ 3-4 oz)    Nectar Thick Nectar Thick Liquid: Not tested   Honey Thick Honey Thick Liquid: Not tested   Puree Puree: Within functional limits Presentation: Self Fed;Spoon (~ 2 oz)   Solid     Solid: Within functional limits Presentation: Self Fed (Half graham cracker)     Randall Hiss Graduate Clinician [place], Speech Pathology  Randall Hiss 01/15/2023,10:27  AM

## 2023-01-15 NOTE — Assessment & Plan Note (Addendum)
Will give a prednisone taper upon discharge.  Continue inhalers

## 2023-01-15 NOTE — ED Notes (Signed)
The pt's oxygen saturation decreased to 85% while sleeping on 4L n/c. This Rn placed the pt on humidified O2 '@6L'$  and the pt is currently sating '@96'$ %. The pt continues to rest in no acute visible distress, close monitoring continued.

## 2023-01-15 NOTE — Hospital Course (Addendum)
67 y.o. female with medical history significant for bipolar disorder, nicotine dependence, hypertension, CAD, COPD who was brought into the hospital by her husband at the request of her psychiatrist. Patient was discharged from the [facility] psych unit after hospitalization for 1 week for manic behavior and was discharged home on 01/09/23.  Patient's husband states that she did well until 3 days prior to this admission when she developed chills at home, decreased appetite and increased lethargy.  He took her to follow-up with the psychiatrist to get her Depakote level checked.  He states that the psychiatrist contacted them prior to her admission and told them she had a high white count and needed to follow-up with her primary care provider. He took the patient to urgent care on the morning of admission to the ER for further evaluation. Patient has had a cough productive of greenish phlegm for several days and has had fever and chills at home.  She also complains of pleuritic chest pain. Patient fell on the day of admission while getting ready to go to the urgent care center.  She denied feeling dizzy or lightheaded and was trying to fix her pant legs when she face planted.  She has a bruise to her nasal bridge and upper lip. She denies having any nausea, no vomiting, no abdominal pain, no changes in her bowel habits, no headache, no blurred vision, no focal deficit. Abnormal labs include a potassium of 3.4, white count of 18.3, sodium of 132 Chest x-ray shows patchy bilateral lower lobe airspace disease concerning for multilobar pneumonia. Patient received a dose of Rocephin and Zithromax.  1/18.  Passed swallow evaluation.  Started on Solu-Medrol for bronchospasm. 1/19.  Patient complaining of left rib pain and coughing a lot.  Incentive spirometer ordered.  Chest x-ray and rib x-ray for tomorrow morning. 1/20.  Pulse ox dropped down to 83% with ambulation today with nursing staff. 1/21.  Pulse ox dropped  down to 83% with ambulation with talking.  Only down to 86% with ambulation only. 1/22.  Nursing staff stated that pulse ox was 80% at rest. 1/23.  Patient qualifies for oxygen at home and set up with home oxygen.  D-dimer borderline high and CT scan of the chest negative for pulmonary embolism and did show multifocal pneumonia and ultrasound lower extremities negative for DVT.  Patient feeling better and wanted to go home.

## 2023-01-15 NOTE — Assessment & Plan Note (Addendum)
Patient had a pulse ox of 85% on 4 L.  Was as high as 6 L during hospital course.  Patient did drop her pulse ox down to 83% on room air with ambulation with talking and walking.  86% on room air with ambulation with just walking.  At rest came up to 90 but then notified that at rest she was down at 86% on room air.  We are unable to taper off oxygen during the hospital course now has chronic respiratory failure.  Patient will go home with 2 L at rest and 3 L with ambulation.

## 2023-01-15 NOTE — ED Notes (Signed)
Informed RN bed assigned 

## 2023-01-15 NOTE — Progress Notes (Signed)
Mobility Specialist - Progress Note   Pre-mobility: SpO2: (92) During mobility: SpO2(89) Post-mobility: SPO2 (93)     01/15/23 1127  Mobility  Activity Stood at bedside;Ambulated with assistance in hallway;Dangled on edge of bed  Level of Assistance Contact guard assist, steadying assist  Assistive Device Front wheel walker  Distance Ambulated (ft) 120 ft  Activity Response Tolerated well  Mobility Referral Yes  $Mobility charge 1 Mobility   Pt resting in bed on 4L upon entry. Pt STS and ambulates to hallway CGA around NS. Pt has a steady gate and CGA for safety due to previous fall. Pt returned to bed and left with needs in reach. Family (son and husband) present in room.   Loma Sender Mobility Specialist 01/15/23, 11:30 AM

## 2023-01-15 NOTE — ED Notes (Signed)
Phone given to pt, husbands number dialed. Pt speaking with husband at this time.

## 2023-01-15 NOTE — Assessment & Plan Note (Signed)
Patient had fall at home with a abrasion on her nose.  PT recommending home health.

## 2023-01-15 NOTE — ED Notes (Signed)
Pt ambulatory to restroom with walker and portable oxygen, pt steady on feet. Pt back to bed and reconnected to monitoring. Pt denies further needs, WCTM. Call light in reach, family at bedside.

## 2023-01-16 DIAGNOSIS — J441 Chronic obstructive pulmonary disease with (acute) exacerbation: Secondary | ICD-10-CM | POA: Diagnosis not present

## 2023-01-16 DIAGNOSIS — R0781 Pleurodynia: Secondary | ICD-10-CM | POA: Diagnosis not present

## 2023-01-16 DIAGNOSIS — J9601 Acute respiratory failure with hypoxia: Secondary | ICD-10-CM | POA: Diagnosis not present

## 2023-01-16 DIAGNOSIS — J189 Pneumonia, unspecified organism: Secondary | ICD-10-CM | POA: Diagnosis not present

## 2023-01-16 DIAGNOSIS — I10 Essential (primary) hypertension: Secondary | ICD-10-CM

## 2023-01-16 MED ORDER — AZITHROMYCIN 250 MG PO TABS
500.0000 mg | ORAL_TABLET | ORAL | Status: AC
  Administered 2023-01-16 – 2023-01-19 (×4): 500 mg via ORAL
  Filled 2023-01-16 (×4): qty 2

## 2023-01-16 MED ORDER — OXYCODONE HCL 5 MG PO TABS
2.5000 mg | ORAL_TABLET | Freq: Four times a day (QID) | ORAL | Status: DC | PRN
  Administered 2023-01-17 – 2023-01-18 (×3): 2.5 mg via ORAL
  Filled 2023-01-16 (×4): qty 1

## 2023-01-16 MED ORDER — ALBUTEROL SULFATE (2.5 MG/3ML) 0.083% IN NEBU
2.5000 mg | INHALATION_SOLUTION | Freq: Three times a day (TID) | RESPIRATORY_TRACT | Status: DC
  Administered 2023-01-16 – 2023-01-20 (×12): 2.5 mg via RESPIRATORY_TRACT
  Filled 2023-01-16 (×12): qty 3

## 2023-01-16 NOTE — Progress Notes (Signed)
PHARMACIST - PHYSICIAN COMMUNICATION DR:   Leslye Peer CONCERNING: Antibiotic IV to Oral Route Change Policy  RECOMMENDATION: This patient is receiving Azithromycin by the intravenous route.  Based on criteria approved by the Pharmacy and Therapeutics Committee, the antibiotic(s) is/are being converted to the equivalent oral dose form(s).   DESCRIPTION: These criteria include: Patient being treated for a respiratory tract infection, urinary tract infection, cellulitis or clostridium difficile associated diarrhea if on metronidazole The patient is not neutropenic and does not exhibit a GI malabsorption state The patient is eating (either orally or via tube) and/or has been taking other orally administered medications for a least 24 hours The patient is improving clinically and has a Tmax < 100.5  If you have questions about this conversion, please contact the Pharmacy Department  '[]'$   [phone] )  [facility] '[x]'$   [phone] )  [facility] '[]'$   [phone] )  [facility] '[]'$   [phone] )  [facility] '[]'$   [phone] )  [facility]

## 2023-01-16 NOTE — Assessment & Plan Note (Addendum)
Will give prednisone taper.  As needed pain medication, as needed Tylenol.

## 2023-01-16 NOTE — Progress Notes (Signed)
Physical Therapy Treatment Patient Details Name: Yvonne Pitts MRN: [id number] DOB: [birth year] Today's Date: 01/16/2023   History of Present Illness Pt is a 67 y.o. female presenting to hospital 01/14/23 s/p fall (fell forward putting on shoes and hit her head); pt with fevers, cough, and decreased appetite.  Recent discharge from in patient psych 01/09/23 (for manic behavior).  Pt admitted with multifocal PNA, hypokalemia, and centrilobular emphysema.  PMH includes bipolar disorder, htn, COPD, ruptured cervical disc, scoliosis, neck sx.    PT Comments    Pt resting in bed upon PT arrival; agreeable to therapy but requesting to toilet first.  During session pt modified independent semi-supine to sitting edge of bed; SBA with transfers; and CGA progressing to SBA ambulating 200 feet with RW use.  Pt's O2 sats 90% or greater on 4 L O2 via nasal cannula during ambulation (92% or greater on 3 L O2 at rest).  Will continue to focus on strengthening, balance, and progressive functional mobility during hospitalization.   Recommendations for follow up therapy are one component of a multi-disciplinary discharge planning process, led by the attending physician.  Recommendations may be updated based on patient status, additional functional criteria and insurance authorization.  Follow Up Recommendations  Home health PT     Assistance Recommended at Discharge Intermittent Supervision/Assistance  Patient can return home with the following A little help with walking and/or transfers;A little help with bathing/dressing/bathroom;Assistance with cooking/housework;Assist for transportation;Help with stairs or ramp for entrance   Equipment Recommendations  Rolling walker (2 wheels)    Recommendations for Other Services       Precautions / Restrictions Precautions Precautions: Fall Restrictions Weight Bearing Restrictions: No     Mobility  Bed Mobility Overal bed mobility: Modified Independent              General bed mobility comments: Semi-supine to sitting with HOB elevated without any noted difficulties.    Transfers Overall transfer level: Needs assistance Equipment used: Rolling walker (2 wheels) Transfers: Sit to/from Stand, Bed to chair/wheelchair/BSC Sit to Stand: Supervision   Step pivot transfers: Supervision       General transfer comment: steady standing from bed x1 trial and from toilet x1 trial; stand step turn bed to recliner no AD close SBA    Ambulation/Gait Ambulation/Gait assistance: Min guard, Supervision Gait Distance (Feet): 200 Feet Assistive device: Rolling walker (2 wheels) Gait Pattern/deviations: Step-through pattern Gait velocity: decreased     General Gait Details: steady with RW use   Stairs             Wheelchair Mobility    Modified Rankin (Stroke Patients Only)       Balance Overall balance assessment: Needs assistance Sitting-balance support: No upper extremity supported, Feet supported Sitting balance-Leahy Scale: Good Sitting balance - Comments: steady sitting reaching within BOS   Standing balance support: Bilateral upper extremity supported, During functional activity, Reliant on assistive device for balance Standing balance-Leahy Scale: Good Standing balance comment: steady ambulating with RW                            Cognition Arousal/Alertness: Awake/alert Behavior During Therapy: WFL for tasks assessed/performed Overall Cognitive Status: Within Functional Limits for tasks assessed  Exercises      General Comments        Pertinent Vitals/Pain Pain Assessment Pain Assessment: Faces Faces Pain Scale: Hurts even more (6/10 with coughing; 2/10 at rest) Pain Location: L ribs (when coughing) Pain Descriptors / Indicators: Guarding, Grimacing, Discomfort, Sharp Pain Intervention(s): Limited activity within patient's tolerance,  Monitored during session, Repositioned HR WFL during sessions activities.    Home Living      Prior Function            PT Goals (current goals can now be found in the care plan section) Acute Rehab PT Goals Patient Stated Goal: to improve mobility and balance PT Goal Formulation: With patient Time For Goal Achievement: 01/29/23 Potential to Achieve Goals: Good Progress towards PT goals: Progressing toward goals    Frequency    Min 2X/week      PT Plan Current plan remains appropriate    Co-evaluation              AM-PAC PT "6 Clicks" Mobility   Outcome Measure  Help needed turning from your back to your side while in a flat bed without using bedrails?: None Help needed moving from lying on your back to sitting on the side of a flat bed without using bedrails?: None Help needed moving to and from a bed to a chair (including a wheelchair)?: A Little Help needed standing up from a chair using your arms (e.g., wheelchair or bedside chair)?: A Little Help needed to walk in hospital room?: A Little Help needed climbing 3-5 steps with a railing? : A Little 6 Click Score: 20    End of Session Equipment Utilized During Treatment: Gait belt;Oxygen Activity Tolerance: Patient tolerated treatment well Patient left: in chair;with call bell/phone within reach;with chair alarm set Nurse Communication: Mobility status;Precautions PT Visit Diagnosis: Other abnormalities of gait and mobility (R26.89);Muscle weakness (generalized) (M62.81);History of falling (Z91.81)     Time: 4193-7902 PT Time Calculation (min) (ACUTE ONLY): 32 min  Charges:  $Gait Training: 8-22 mins $Therapeutic Activity: 8-22 mins                     Leitha Bleak, PT 01/16/23, 2:58 PM

## 2023-01-16 NOTE — TOC Initial Note (Signed)
Transition of Care [facility]) - Initial/Assessment Note    Patient Details  Name: Yvonne Pitts MRN: [id number] Date of Birth: January 19, 1956  Transition of Care [facility]) CM/SW Contact:    Magnus Ivan, LCSW Phone Number: 01/16/2023, 9:11 AM  Clinical Narrative:       CSW spoke with patient regarding DC planning and PT recommendations. Patient lives with her husband and son. Husband provides transportation. PCP is Dr. Ginette Pitman. Pharmacy is [facility]. No DME or HH history. Patient's physical home address is [place] [address]. Patient is agreeable with RW and HH recs. RW referral made to [facility] with [facility]. HHPT referral made to [place] with [facility].    Expected Discharge Plan: [place] Barriers to Discharge: Continued Medical Work up   Patient Goals and CMS Choice Patient states their goals for this hospitalization and ongoing recovery are:: home with home health CMS Medicare.gov Compare Post Acute Care list provided to:: Patient Choice offered to / list presented to : Patient      Expected Discharge Plan and Services       Living arrangements for the past 2 months: Single Family Home                 DME Arranged: Walker rolling DME Agency: [employer] Date DME Agency Contacted: 01/16/23   Representative spoke with at DME Agency: [place]: PT [place]: [place] Date [place]: 01/16/23   Representative spoke with at [place]: [place]  Prior Living Arrangements/Services Living arrangements for the past 2 months: [place] with:: Spouse, Adult Children Patient language and need for interpreter reviewed:: Yes Do you feel safe going back to the place where you live?: Yes      Need for Family Participation in Patient Care: Yes (Comment) Care giver support system in place?: Yes (comment)   Criminal Activity/Legal Involvement Pertinent to Current Situation/Hospitalization: No -  Comment as needed  Activities of Daily Living Home Assistive Devices/Equipment: Eyeglasses ADL Screening (condition at time of admission) Patient's cognitive ability adequate to safely complete daily activities?: Yes Is the patient deaf or have difficulty hearing?: No Does the patient have difficulty seeing, even when wearing glasses/contacts?: No Does the patient have difficulty concentrating, remembering, or making decisions?: No Patient able to express need for assistance with ADLs?: Yes Does the patient have difficulty dressing or bathing?: Yes Independently performs ADLs?: Yes (appropriate for developmental age) Does the patient have difficulty walking or climbing stairs?: No Weakness of Legs: Both Weakness of Arms/Hands: None  Permission Sought/Granted Permission sought to share information with : Facility Sport and exercise psychologist, Family Supports Permission granted to share information with : Yes, Verbal Permission Granted     Permission granted to share info w AGENCY: [place], DME agencies  Permission granted to share info w Relationship: spouse     Emotional Assessment       Orientation: : Oriented to Self, Oriented to Situation, Oriented to Place, Oriented to  Time Alcohol / Substance Use: Not Applicable Psych Involvement: No (comment)  Admission diagnosis:  Multifocal pneumonia [J18.9] Patient Active Problem List   Diagnosis Date Noted   COPD with acute exacerbation ([place]) 01/15/2023   Acute respiratory failure with hypoxia ([place]) 01/15/2023   Weakness 01/15/2023   Multifocal pneumonia 01/14/2023   Hypokalemia 01/14/2023   Bipolar affective disorder, currently manic, moderate ([place]) 01/01/2023   Leukocytosis 07/22/2022   Tobacco abuse 07/19/2022   GERD (gastroesophageal reflux disease)  07/18/2022   Essential hypertension 07/18/2022   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/03/2020   COPD, moderate ([place]) 11/03/2016   PCP:  Tracie Harrier,  MD Pharmacy:   [facility] [phone] Lorina Rabon, [place] [address] [place] [address] Phone: [phone] Fax: [phone]  [id number] [place] [place] [address] [place] [place]: [phone]: [phone] [facility] [place]Phone: [phone]: [phone]    Social Determinants of Health (SDOH) Social History: [place] No Food Insecurity (01/15/2023)  Housing: Low Risk  (01/15/2023)  Transportation Needs: No Transportation Needs (01/15/2023)  Utilities: Not At Risk (01/15/2023)  Alcohol Screen: Low Risk  (01/02/2023)  Depression (PHQ2-9): High Risk (01/12/2023)  Tobacco Use: High Risk (01/14/2023)   SDOH Interventions:     Readmission Risk Interventions     No data to display

## 2023-01-16 NOTE — Plan of Care (Signed)
Patient AOX4, VSS throughout shift.  All meds given on time as ordered.  Diminished lungs, IS encouraged.  Pt c/o pain and cough relieved by tylenol and cough medicine.  Pt standby assist to bathroom.  POC maintained, will continue to monitor.  Problem: Activity: Goal: Ability to tolerate increased activity will improve Outcome: Progressing   Problem: Clinical Measurements: Goal: Ability to maintain a body temperature in the normal range will improve Outcome: Progressing   Problem: Respiratory: Goal: Ability to maintain adequate ventilation will improve Outcome: Progressing Goal: Ability to maintain a clear airway will improve Outcome: Progressing   Problem: Education: Goal: Knowledge of General Education information will improve Description: Including pain rating scale, medication(s)/side effects and non-pharmacologic comfort measures Outcome: Progressing   Problem: Health Behavior/Discharge Planning: Goal: Ability to manage health-related needs will improve Outcome: Progressing   Problem: Clinical Measurements: Goal: Ability to maintain clinical measurements within normal limits will improve Outcome: Progressing Goal: Will remain free from infection Outcome: Progressing Goal: Diagnostic test results will improve Outcome: Progressing Goal: Respiratory complications will improve Outcome: Progressing Goal: Cardiovascular complication will be avoided Outcome: Progressing   Problem: Activity: Goal: Risk for activity intolerance will decrease Outcome: Progressing   Problem: Nutrition: Goal: Adequate nutrition will be maintained Outcome: Progressing   Problem: Coping: Goal: Level of anxiety will decrease Outcome: Progressing   Problem: Elimination: Goal: Will not experience complications related to bowel motility Outcome: Progressing Goal: Will not experience complications related to urinary retention Outcome: Progressing   Problem: Pain Managment: Goal: General  experience of comfort will improve Outcome: Progressing   Problem: Safety: Goal: Ability to remain free from injury will improve Outcome: Progressing   Problem: Skin Integrity: Goal: Risk for impaired skin integrity will decrease Outcome: Progressing

## 2023-01-16 NOTE — Evaluation (Signed)
Occupational Therapy Evaluation Patient Details Name: Yvonne Pitts MRN: [id number] DOB: 1956/02/05 Today's Date: 01/16/2023   History of Present Illness Pt is a 67 y.o. female presenting to hospital 01/14/23 s/p fall (fell forward putting on shoes and hit her head); pt with fevers, cough, and decreased appetite.  Recent discharge from in patient psych 01/09/23 (for manic behavior).  Pt admitted with multifocal PNA, hypokalemia, and centrilobular emphysema.  PMH includes bipolar disorder, htn, COPD, ruptured cervical disc, scoliosis, neck sx.   Clinical Impression   Ms. Cone presents with generalized weakness, limited endurance, impaired balance, SOB, and pain. She lives with her husband and son; has been IND in fxl mobility and ADL performance, does not use AD, denies falls history other than the fall that preceded this hospitalization. During today's evaluation, pt presents with 7/10 pain along lateral L ribs with coughing. When not coughing, rib pain is 1/10. Pt is guarding L side and reports mild pain with movement of L UE. Pt does not use supplemental O2 at baseline. Today she is on 3L O2 via [place], w/ O2 sats at 90% in supine, dropping to 88% with ambulation. Pt requires SUPV for safety during transfers and ambulation; she does not experience any over LOB but appears slightly unsteady in standing and reports feeling "very weak" and "shaky" with walking. Pt will benefit from ongoing OT while hospitalized. Given that she presents with weakness, pain, and impaired balance, also recommend HHOT post DC, to assist pt in return to PLOF.    Recommendations for follow up therapy are one component of a multi-disciplinary discharge planning process, led by the attending physician.  Recommendations may be updated based on patient status, additional functional criteria and insurance authorization.   Follow Up Recommendations  Home health OT     Assistance Recommended at Discharge Intermittent  Supervision/Assistance  Patient can return home with the following A little help with bathing/dressing/bathroom;Assistance with cooking/housework    Functional Status Assessment     Equipment Recommendations  None recommended by OT    Recommendations for Other Services       Precautions / Restrictions Precautions Precautions: Fall Restrictions Weight Bearing Restrictions: No      Mobility Bed Mobility Overal bed mobility: Modified Independent                  Transfers Overall transfer level: Needs assistance Equipment used: Rolling walker (2 wheels) Transfers: Sit to/from Stand Sit to Stand: Supervision                  Balance Overall balance assessment: Needs assistance Sitting-balance support: No upper extremity supported, Feet supported Sitting balance-Leahy Scale: Good     Standing balance support: Bilateral upper extremity supported, During functional activity, Reliant on assistive device for balance Standing balance-Leahy Scale: Good Standing balance comment: Pt reports she feels slightly "shaky" and "weak" with ambulation                           ADL either performed or assessed with clinical judgement   ADL Overall ADL's : Needs assistance/impaired     Grooming: Wash/dry hands;Wash/dry face;Oral care;Standing;Supervision/safety Grooming Details (indicate cue type and reason): Kept on hand on sink for support             Lower Body Dressing: Supervision/safety;Sitting/lateral leans   Toilet Transfer: Supervision/safety;Rolling walker (2 wheels)  Vision         Perception     Praxis      Pertinent Vitals/Pain Pain Assessment Pain Assessment: 0-10 Pain Score: 7  Pain Location: L ribs, when coughing Pain Descriptors / Indicators: Guarding, Grimacing, Discomfort, Sharp Pain Intervention(s): Repositioned     Hand Dominance     Extremity/Trunk Assessment Upper Extremity  Assessment Upper Extremity Assessment: Overall WFL for tasks assessed   Lower Extremity Assessment Lower Extremity Assessment: Overall WFL for tasks assessed   Cervical / Trunk Assessment Cervical / Trunk Assessment: Normal   Communication Communication Communication: No difficulties   Cognition Arousal/Alertness: Awake/alert Behavior During Therapy: WFL for tasks assessed/performed Overall Cognitive Status: Within Functional Limits for tasks assessed                                       General Comments       Exercises Other Exercises Other Exercises: Educ re: falls prevention, O2 monitoring, gradual return to PLOF   Shoulder Instructions      Home Living Family/patient expects to be discharged to:: Private residence Living Arrangements: Children;Spouse/significant other Available Help at Discharge: Family;Available 24 hours/day Type of Home: House Home Access: Stairs to enter [employer] of Steps: about 2 inch threshold to enter home Entrance Stairs-Rails: None Home Layout: Two level Alternate Level Stairs-Number of Steps: 2 steps from den to [employer] with R railing   Bathroom Shower/Tub: Occupational psychologist: Standard     Home Equipment: None          Prior Functioning/Environment Prior Level of Function : Independent/Modified Independent             Mobility Comments: Pt reports no other recent falls.  No AD use baseline. ADLs Comments: IND in B/IADLs        OT Problem List: Decreased strength;Decreased range of motion;Impaired balance (sitting and/or standing);Pain;Decreased activity tolerance;Cardiopulmonary status limiting activity;Impaired UE functional use      OT Treatment/Interventions: Self-care/ADL training;Patient/family education;Balance training;Energy conservation;Therapeutic activities;DME and/or AE instruction;Therapeutic exercise    OT Goals(Current goals can be found in the care plan section)  Acute Rehab OT Goals Patient Stated Goal: to get back to "normal" OT Goal Formulation: With patient Time For Goal Achievement: 01/30/23 Potential to Achieve Goals: Good ADL Goals Pt Will Perform Grooming: Independently;standing (standing at sink for 5+ minutes w/o SOB) Pt Will Transfer to Toilet: Independently;regular height toilet;ambulating Pt Will Perform Tub/Shower Transfer: Independently;ambulating  OT Frequency: Min 2X/week    Co-evaluation              AM-PAC OT "6 Clicks" Daily Activity     Outcome Measure Help from another person eating meals?: None Help from another person taking care of personal grooming?: A Little Help from another person toileting, which includes using toliet, bedpan, or urinal?: A Little Help from another person bathing (including washing, rinsing, drying)?: A Little Help from another person to put on and taking off regular upper body clothing?: A Little Help from another person to put on and taking off regular lower body clothing?: A Little 6 Click Score: 19   End of Session Equipment Utilized During Treatment: Rolling walker (2 wheels)  Activity Tolerance: Patient tolerated treatment well Patient left: in bed;with family/visitor present;with call bell/phone within reach  OT Visit Diagnosis: Unsteadiness on feet (R26.81);Muscle weakness (generalized) (M62.81);Pain  Time: 3403-7096 OT Time Calculation (min): 19 min Charges:  OT General Charges $OT Visit: 1 Visit OT Evaluation $OT Eval Low Complexity: 1 Low OT Treatments $Self Care/Home Management : 8-22 mins Josiah Lobo, PhD, MS, OTR/L 01/16/23, 1:41 PM

## 2023-01-16 NOTE — Progress Notes (Signed)
Speech Language Pathology Treatment: Dysphagia  Patient Details Name: Yvonne Pitts MRN: [id number] DOB: 08/05/1956 Today's Date: 01/16/2023 Time: 9242-6834 SLP Time Calculation (min) (ACUTE ONLY): 35 min  Assessment / Plan / Recommendation Clinical Impression  Pt seen for f/u w/ toleration of diet this tx session. Pt awake, verbal and sitting up in bed feeding self her Lunch meal of tomato soup and chicken salad sandwich -- she stated "the kitchen has got it going on w/ their food".  Pt often talked of her Husband "eating so fast" -- encouraged her to focus on her own eating and not talking during meals, to take her time w/ meals. Pt agreed. Pt is Afebrile, WBC trending down; on [place] O2 support of 2-3L.     Pt appears to present w/ functional oropharyngeal phase swallow w/ No oropharyngeal phase dysphagia noted, No neuromuscular deficits noted. Pt consumed po trials w/ No overt, clinical s/s of aspiration during po trials. Pt appears at reduced risk for aspiration following general aspiration precautions and taking rest breaks for conservation of energy.  However, pt does have challenging factors that could impact oropharyngeal swallowing to include baseline deconditioning and declined Pulmonary status requiring O2 needs at home. Pt also appears easily Distracted and Talks frequently w/ food in her mouth if others are present. These factors can increase risk for choking, aspiration. Following general precautions and strategies helped to compensate and reduce risks.   During po trials, pt consumed consistencies w/ no overt coughing, decline in vocal quality, or change in respiratory presentation during/post trials. Rest Breaks were give during eating/drinking to avoid any SOB/WOB. Oral phase appeared [facility] w/ timely bolus management and control of bolus propulsion for A-P transfer for swallowing. Min extra Time for full mastication and oral clearing of increased textured trials noted. Oral clearing  achieved w/ trial consistencies. Moistened, soft foods cut Small were educated on for ease of intake, chewing, and conservation of energy.     Recommend continue a Regular consistency diet w/ well-Cut meats, moistened foods; Thin liquids -- monitor straw use. Recommend general aspiration precautions, strategies for conservation of energy including Rest Breaks during oral intake, meals. REDUCE DISTRACTIONS AND TALKING DURING ORAL INTAKE/MEALS. Pills Whole in Puree for safer, easier swallowing control if needed moving forward.    Education given on Pills in Puree(model provided); food consistencies and easy to eat options; general aspiration precautions including NO TALKING WHEN EATING to pt. NSG to reconsult if any new needs arise. NSG updated, agreed. MD updated. Recommend Dietician f/u for support. No further acute skilled ST services indicated at this time. MD to reconsult if new needs arise during admit.         HPI HPI: Pt is a 67 y.o. female with medical history significant for Bipolar Disorder, nicotine dependence, hypertension, CAD, COPD who was brought into the hospital by her husband at the request of her psychiatrist.  Patient was discharged from the [facility] psych unit after hospitalization for 1 week for manic behavior and was discharged home on 01/09/23.  Patient's husband states that she did well until 3 days prior to this admission when she developed chills at home, decreased appetite and increased lethargy.  He took her to follow-up with the psychiatrist to get her Depakote level checked.  He states that the psychiatrist contacted them prior to her admission and told them she had a high white count and needed to follow-up with her primary care provider.  He took the patient to urgent care on the  morning of admission to the ER for further evaluation.  Patient has had a cough productive of greenish phlegm for several days and has had fever and chills at home.  She also complains of pleuritic  chest pain.  Patient fell on the day of admission while getting ready to go to the urgent care center.  She denied feeling dizzy or lightheaded and was trying to fix her pant legs when she face planted.  She has a bruise to her nasal bridge and upper lip.  She denies having any nausea, no vomiting, no abdominal pain, no changes in her bowel habits, no headache, no blurred vision, no focal deficit.  Abnormal labs include a potassium of 3.4, white count of 18.3, sodium of 132  Chest x-ray reviewed by me shows patchy bilateral lower lobe airspace disease concerning for multilobar pneumonia. Previous chest Imaging in 06/2022 revealed no acute processes but several small lung nodules without change from the prior CT chest, lung cancer screening exam.      SLP Plan  All goals met      Recommendations for follow up therapy are one component of a multi-disciplinary discharge planning process, led by the attending physician.  Recommendations may be updated based on patient status, additional functional criteria and insurance authorization.    Recommendations  Diet recommendations: Regular;Thin liquid (cut foods, moistened well) Liquids provided via: Cup;Straw (monitor straw use) Medication Administration: Whole meds with liquid (but consider whole w/ Puree if needed for ease of swallowing) Supervision: Patient able to self feed (setup as needed) Compensations: Minimize environmental distractions;Slow rate;Small sips/bites;Lingual sweep for clearance of pocketing;Follow solids with liquid (Rest Breaks during meals for conservation of energy) Postural Changes and/or Swallow Maneuvers: Out of bed for meals;Seated upright 90 degrees;Upright 30-60 min after meal                General recommendations:  ([place] f/u) Oral Care Recommendations: Oral care before and after PO;Oral care BID;Patient independent with oral care (setup) Follow Up Recommendations: No SLP follow up Assistance recommended at  discharge: PRN SLP Visit Diagnosis: Dysphagia, unspecified (R13.10) Plan: All goals met             Orinda Kenner, MS, [place]; [place] [phone] (ascom) Azhar Yogi  01/16/2023, 3:05 PM

## 2023-01-16 NOTE — Progress Notes (Signed)
Progress Note   Patient: Yvonne Pitts IEP:[id number] DOB: [birth year] DOA: 01/14/2023     2 DOS: the patient was seen and examined on 01/16/2023   Brief hospital course: 67 y.o. female with medical history significant for bipolar disorder, nicotine dependence, hypertension, CAD, COPD who was brought into the hospital by her husband at the request of her psychiatrist. Patient was discharged from the [facility] psych unit after hospitalization for 1 week for manic behavior and was discharged home on 01/09/23.  Patient's husband states that she did well until 3 days prior to this admission when she developed chills at home, decreased appetite and increased lethargy.  He took her to follow-up with the psychiatrist to get her Depakote level checked.  He states that the psychiatrist contacted them prior to her admission and told them she had a high white count and needed to follow-up with her primary care provider. He took the patient to urgent care on the morning of admission to the ER for further evaluation. Patient has had a cough productive of greenish phlegm for several days and has had fever and chills at home.  She also complains of pleuritic chest pain. Patient fell on the day of admission while getting ready to go to the urgent care center.  She denied feeling dizzy or lightheaded and was trying to fix her pant legs when she face planted.  She has a bruise to her nasal bridge and upper lip. She denies having any nausea, no vomiting, no abdominal pain, no changes in her bowel habits, no headache, no blurred vision, no focal deficit. Abnormal labs include a potassium of 3.4, white count of 18.3, sodium of 132 Chest x-ray shows patchy bilateral lower lobe airspace disease concerning for multilobar pneumonia. Patient received a dose of Rocephin and Zithromax.  1/18.  Passed swallow evaluation.  Started on Solu-Medrol for bronchospasm. 1/19.  Patient complaining of left rib pain and coughing a lot.   Incentive spirometer ordered.  Chest x-ray and rib x-ray for tomorrow morning.  Assessment and Plan: * Multifocal pneumonia Continue Rocephin and Zithromax.  Pleuritic chest pain Solu-Medrol should help.  As needed pain medication, as needed Tylenol.  Will repeat a chest x-ray and get a rib x-ray tomorrow morning.  Acute respiratory failure with hypoxia ([place]) Patient had a pulse ox of 85% on 4 L.  Was as high as 6 L yesterday.  Looks like this afternoon tapered off oxygen on 1 vital sign reading.  COPD with acute exacerbation (HCC) Continue Solu-Medrol.  Continue nebulizer treatments and inhalers.  Tobacco abuse Nicotine patch  Bipolar affective disorder, currently manic, moderate (HCC) Recent hospitalization in the [facility] psych unit for manic behavior Continue Depakote, citalopram and Seroquel  Weakness Patient had fall at home with a abrasion on her nose.  PT recommending home health.  Hypokalemia Replaced  Essential hypertension On Norvasc.  GERD (gastroesophageal reflux disease) Continue PPI        Subjective: Patient having some pain in her left ribs today.  Coughs with deep breath.  Coughing more.  Admitted with pneumonia and acute respiratory failure.  Physical Exam: Vitals:   01/16/23 0322 01/16/23 0842 01/16/23 1343 01/16/23 1635  BP: (!) 121/53 (!) 113/45  (!) 120/53  Pulse: 75 86  79  Resp: '20 20  18  '$ Temp: 97.6 F (36.4 C) 98.2 F (36.8 C)  98 F (36.7 C)  TempSrc: Oral   Oral  SpO2: 95% (!) 89% 95% 95%  Weight:  Height:       Physical Exam HENT:     Head: Normocephalic.     Mouth/Throat:     Pharynx: No oropharyngeal exudate.  Eyes:     General: Lids are normal.     Conjunctiva/sclera: Conjunctivae normal.  Cardiovascular:     Rate and Rhythm: Normal rate and regular rhythm.     Heart sounds: Normal heart sounds, S1 normal and S2 normal.  Pulmonary:     Breath sounds: Examination of the right-lower field reveals decreased breath  sounds and rhonchi. Examination of the left-lower field reveals decreased breath sounds and rhonchi. Decreased breath sounds and rhonchi present. No wheezing or rales.  Chest:     Comments: Some pain when palpating over the left ribs. Abdominal:     Palpations: Abdomen is soft.     Tenderness: There is no abdominal tenderness.  Musculoskeletal:     Right lower leg: No swelling.     Left lower leg: No swelling.  Skin:    General: Skin is warm.     Findings: No rash.     Comments: Abrasion on the bridge of her nose.  Neurological:     Mental Status: She is alert and oriented to person, place, and time.     Data Reviewed: Last potassium 3.4, creatinine 0.37  Family Communication: Spoke with son and husband at the bedside  Disposition: Status is: Inpatient Remains inpatient appropriate because: Patient with a lot of coughing and rib pain today.  Reassess tomorrow.  Continue IV Solu-Medrol for pleuritic pain.  Tapered off oxygen today.  Planned Discharge Destination: Home with Home Health    Time spent: 28 minutes  Author: Loletha Grayer, MD 01/16/2023 6:00 PM  For on call review [email or web address].

## 2023-01-16 NOTE — Progress Notes (Signed)
Mobility Specialist - Progress Note  During mobility: SpO2(89) Post-mobility: SPO2(92)   01/16/23 1802  Mobility  Activity Ambulated with assistance in hallway;Stood at bedside  Level of Assistance Standby assist, set-up cues, supervision of patient - no hands on  Assistive Device Front wheel walker  Distance Ambulated (ft) 160 ft  Activity Response Tolerated well  Mobility Referral Yes  $Mobility charge 1 Mobility   Pt OOB in bathroom upon entry on 3L. Pt STS and ambulates to hallway around [place] with RW SBA. Pt returned to recliner and left with needs in reach.   Loma Sender Mobility Specialist 01/16/23, 6:04 PM

## 2023-01-16 NOTE — Assessment & Plan Note (Signed)
On Norvasc.

## 2023-01-17 ENCOUNTER — Inpatient Hospital Stay: Payer: Medicare HMO

## 2023-01-17 DIAGNOSIS — R0781 Pleurodynia: Secondary | ICD-10-CM | POA: Diagnosis not present

## 2023-01-17 DIAGNOSIS — J441 Chronic obstructive pulmonary disease with (acute) exacerbation: Secondary | ICD-10-CM | POA: Diagnosis not present

## 2023-01-17 DIAGNOSIS — J189 Pneumonia, unspecified organism: Secondary | ICD-10-CM | POA: Diagnosis not present

## 2023-01-17 DIAGNOSIS — J9601 Acute respiratory failure with hypoxia: Secondary | ICD-10-CM | POA: Diagnosis not present

## 2023-01-17 NOTE — Progress Notes (Signed)
SATURATION QUALIFICATIONS: (This note is used to comply with regulatory documentation for home oxygen)  Patient Saturations on Room Air at Rest = 89%  Patient Saturations on Room Air while Ambulating = 83%  Patient Saturations on 5 Liters of oxygen while Ambulating = 94%  Please briefly explain why patient needs home oxygen:  Patient needs home oxygen since she is unable to maintained SpO2 >90% on room air at rest. D Wieting was notified.

## 2023-01-17 NOTE — Progress Notes (Signed)
Mobility Specialist - Progress Note   Pre-mobility:SpO2 (88); after 1-2 minutes (94) During mobility: SpO2 (92) Post-mobility: SPO2 (93)    01/17/23 1434  Mobility  Activity Ambulated with assistance in hallway;Stood at bedside  Level of Assistance Standby assist, set-up cues, supervision of patient - no hands on  Assistive Device Front wheel walker  Distance Ambulated (ft) 200 ft  Activity Response Tolerated well  Mobility Referral Yes  $Mobility charge 1 Mobility   Pt resting in bed on 3L upon entry. Pt STS x1 to RW and had a coughing fit. Author initiated immediate sit down and pt SpO2 level saturated at 87%. Author briefly increased O2 to 4L to aid in recovery and pt given VC not to talk and take deep breathes for 1-2 minutes; pt returned to (94%). Pt O2 turned back down to 3L and pt STS and ambulates to hallway SBA for 144f. RN gave instruction to ambulate pt on 4L during mobility session. Pt returned to room and left with needs in reach; bed alarm activated and pt put back on 3L at rest.   AJohnsonSpecialist 01/17/23, 2:42 PM

## 2023-01-17 NOTE — Progress Notes (Signed)
Progress Note   Patient: Yvonne Pitts:[id number] DOB: June 19, 1956 DOA: 01/14/2023     3 DOS: the patient was seen and examined on 01/17/2023   Brief hospital course: 67 y.o. female with medical history significant for bipolar disorder, nicotine dependence, hypertension, CAD, COPD who was brought into the hospital by her husband at the request of her psychiatrist. Patient was discharged from the [facility] psych unit after hospitalization for 1 week for manic behavior and was discharged home on 01/09/23.  Patient's husband states that she did well until 3 days prior to this admission when she developed chills at home, decreased appetite and increased lethargy.  He took her to follow-up with the psychiatrist to get her Depakote level checked.  He states that the psychiatrist contacted them prior to her admission and told them she had a high white count and needed to follow-up with her primary care provider. He took the patient to urgent care on the morning of admission to the ER for further evaluation. Patient has had a cough productive of greenish phlegm for several days and has had fever and chills at home.  She also complains of pleuritic chest pain. Patient fell on the day of admission while getting ready to go to the urgent care center.  She denied feeling dizzy or lightheaded and was trying to fix her pant legs when she face planted.  She has a bruise to her nasal bridge and upper lip. She denies having any nausea, no vomiting, no abdominal pain, no changes in her bowel habits, no headache, no blurred vision, no focal deficit. Abnormal labs include a potassium of 3.4, white count of 18.3, sodium of 132 Chest x-ray shows patchy bilateral lower lobe airspace disease concerning for multilobar pneumonia. Patient received a dose of Rocephin and Zithromax.  1/18.  Passed swallow evaluation.  Started on Solu-Medrol for bronchospasm. 1/19.  Patient complaining of left rib pain and coughing a lot.   Incentive spirometer ordered.  Chest x-ray and rib x-ray for tomorrow morning. 1/20.  Pulse ox dropped down to 83% with ambulation today with nursing staff.  Assessment and Plan: * Multifocal pneumonia Continue Rocephin and Zithromax.  Pleuritic chest pain Solu-Medrol should help.  As needed pain medication, as needed Tylenol.  Chest x-ray showing worsening pneumonia and no definite rib fracture.  Acute respiratory failure with hypoxia ([place]) Patient had a pulse ox of 85% on 4 L.  Was as high as 6 L during hospital course.  Patient did drop her pulse ox down to 83% on room air today with ambulation.  COPD with acute exacerbation (HCC) Continue Solu-Medrol.  Continue nebulizer treatments and inhalers.  Tobacco abuse Nicotine patch  Bipolar affective disorder, currently manic, moderate (HCC) Recent hospitalization in the [facility] psych unit for manic behavior Continue Depakote, citalopram and Seroquel  Weakness Patient had fall at home with a abrasion on her nose.  PT recommending home health.  Hypokalemia Replaced  Essential hypertension On Norvasc.  GERD (gastroesophageal reflux disease) Continue PPI        Subjective: Patient feeling a little sleepy today.  Felt a little weak after walking.  Pulse ox dropped down to 83% with ambulation on room air.  Admitted with pneumonia.  Physical Exam: Vitals:   01/17/23 1003 01/17/23 1004 01/17/23 1011 01/17/23 1012  BP:      Pulse:      Resp:      Temp:      TempSrc:      SpO2: 92% Marland Kitchen)  89% (!) 83% 94%  Weight:      Height:       Physical Exam HENT:     Head: Normocephalic.     Mouth/Throat:     Pharynx: No oropharyngeal exudate.  Eyes:     General: Lids are normal.     Conjunctiva/sclera: Conjunctivae normal.  Cardiovascular:     Rate and Rhythm: Normal rate and regular rhythm.     Heart sounds: Normal heart sounds, S1 normal and S2 normal.  Pulmonary:     Breath sounds: Examination of the right-lower field reveals  decreased breath sounds and rhonchi. Examination of the left-lower field reveals decreased breath sounds and rhonchi. Decreased breath sounds and rhonchi present. No wheezing or rales.  Chest:     Comments: Some pain when palpating over the left ribs. Abdominal:     Palpations: Abdomen is soft.     Tenderness: There is no abdominal tenderness.  Musculoskeletal:     Right lower leg: No swelling.     Left lower leg: No swelling.  Skin:    General: Skin is warm.     Findings: No rash.     Comments: Abrasion on the bridge of her nose.  Neurological:     Mental Status: She is alert and oriented to person, place, and time.     Data Reviewed: Rib x-ray does not show any definite pneumonia Chest x-ray showing worsening of pneumonia left side.  Family Communication: Spoke with husband and son at the bedside  Disposition: Status is: Inpatient Remains inpatient appropriate because: Check pulse ox of 83% with ambulation today.  The patient does not wear oxygen and will try to get off oxygen prior to disposition.  Planned Discharge Destination: Home with Home Health    Time spent: 28 minutes  Author: Loletha Grayer, MD 01/17/2023 1:01 PM  For on call review [email or web address].

## 2023-01-18 DIAGNOSIS — R0781 Pleurodynia: Secondary | ICD-10-CM | POA: Diagnosis not present

## 2023-01-18 DIAGNOSIS — J441 Chronic obstructive pulmonary disease with (acute) exacerbation: Secondary | ICD-10-CM | POA: Diagnosis not present

## 2023-01-18 DIAGNOSIS — J189 Pneumonia, unspecified organism: Secondary | ICD-10-CM | POA: Diagnosis not present

## 2023-01-18 DIAGNOSIS — J9601 Acute respiratory failure with hypoxia: Secondary | ICD-10-CM | POA: Diagnosis not present

## 2023-01-18 NOTE — Progress Notes (Addendum)
Progress Note   Patient: Yvonne Pitts YQM:[id number] DOB: 28-Mar-1956 DOA: 01/14/2023     4 DOS: the patient was seen and examined on 01/18/2023   Brief hospital course: 67 y.o. female with medical history significant for bipolar disorder, nicotine dependence, hypertension, CAD, COPD who was brought into the hospital by her husband at the request of her psychiatrist. Patient was discharged from the [facility] psych unit after hospitalization for 1 week for manic behavior and was discharged home on 01/09/23.  Patient's husband states that she did well until 3 days prior to this admission when she developed chills at home, decreased appetite and increased lethargy.  He took her to follow-up with the psychiatrist to get her Depakote level checked.  He states that the psychiatrist contacted them prior to her admission and told them she had a high white count and needed to follow-up with her primary care provider. He took the patient to urgent care on the morning of admission to the ER for further evaluation. Patient has had a cough productive of greenish phlegm for several days and has had fever and chills at home.  She also complains of pleuritic chest pain. Patient fell on the day of admission while getting ready to go to the urgent care center.  She denied feeling dizzy or lightheaded and was trying to fix her pant legs when she face planted.  She has a bruise to her nasal bridge and upper lip. She denies having any nausea, no vomiting, no abdominal pain, no changes in her bowel habits, no headache, no blurred vision, no focal deficit. Abnormal labs include a potassium of 3.4, white count of 18.3, sodium of 132 Chest x-ray shows patchy bilateral lower lobe airspace disease concerning for multilobar pneumonia. Patient received a dose of Rocephin and Zithromax.  1/18.  Passed swallow evaluation.  Started on Solu-Medrol for bronchospasm. 1/19.  Patient complaining of left rib pain and coughing a lot.   Incentive spirometer ordered.  Chest x-ray and rib x-ray for tomorrow morning. 1/20.  Pulse ox dropped down to 83% with ambulation today with nursing staff. 1/21.  Pulse ox dropped down to 83% with ambulation with talking.  Only down to 86% with ambulation only.  Assessment and Plan: * Acute respiratory failure with hypoxia ([place]) Patient had a pulse ox of 85% on 4 L.  Was as high as 6 L during hospital course.  Patient did drop her pulse ox down to 83% on room air with ambulation with talking and walking.  86% on room air with ambulation with just walking.  At rest came up to 90 but then notified that at rest she was down at 86% on room air.  Having difficulty tapering off oxygen.  Multifocal pneumonia Continue Rocephin and Zithromax (5-day course).  Pleuritic chest pain Solu-Medrol should help.  As needed pain medication, as needed Tylenol.  Chest x-ray showing worsening pneumonia and no definite rib fracture.  COPD with acute exacerbation (HCC) Continue Solu-Medrol.  Continue nebulizer treatments and inhalers.  Tobacco abuse Nicotine patch  Bipolar affective disorder, currently manic, moderate (HCC) Recent hospitalization in the [facility] psych unit for manic behavior Continue Depakote, citalopram and Seroquel  Weakness Patient had fall at home with a abrasion on her nose.  PT recommending home health.  Hypokalemia Replaced  Essential hypertension On Norvasc.  GERD (gastroesophageal reflux disease) Continue PPI        Subjective: Patient still has some rib pain.  Still has some cough.  Still hypoxic with  ambulation.  Admitted with pneumonia.  Physical Exam: Vitals:   01/18/23 1000 01/18/23 1347 01/18/23 1504 01/18/23 1509  BP: (!) 104/53  117/62   Pulse:   74   Resp:      Temp:   98.5 F (36.9 C)   TempSrc:   Oral   SpO2:  96% (!) 86% 94%  Weight:      Height:       Physical Exam HENT:     Head: Normocephalic.     Mouth/Throat:     Pharynx: No oropharyngeal  exudate.  Eyes:     General: Lids are normal.     Conjunctiva/sclera: Conjunctivae normal.  Cardiovascular:     Rate and Rhythm: Normal rate and regular rhythm.     Heart sounds: Normal heart sounds, S1 normal and S2 normal.  Pulmonary:     Breath sounds: Examination of the right-lower field reveals decreased breath sounds. Examination of the left-lower field reveals decreased breath sounds. Decreased breath sounds present. No wheezing, rhonchi or rales.  Chest:     Comments: Some pain when palpating over the left ribs. Abdominal:     Palpations: Abdomen is soft.     Tenderness: There is no abdominal tenderness.  Musculoskeletal:     Right lower leg: No swelling.     Left lower leg: No swelling.  Skin:    General: Skin is warm.     Findings: No rash.     Comments: Abrasion on the bridge of her nose.  Neurological:     Mental Status: She is alert and oriented to person, place, and time.     Data Reviewed: No new data today  Family Communication: Spoke with husband and son at the bedside  Disposition: Status is: Inpatient Remains inpatient appropriate because: Still having trouble coming off oxygen.  Dropped down to 83% while talking and walking today.  86% will just walking.  Just notified by nursing staff that at rest was 86% on room air  Planned Discharge Destination: Home with [place]    Time spent: 28 minutes  Author: Loletha Grayer, MD 01/18/2023 3:36 PM  For on call review [email or web address].

## 2023-01-18 NOTE — Progress Notes (Signed)
Mobility Specialist - Progress Note  Pre-mobility: SpO2(92) on 1L During mobility: SpO2 (86) on 1L, SpO2(90) on 2L Post-mobility: SPO2(93) back on 1L   01/18/23 1655  Mobility  Activity Ambulated independently in hallway  Level of Assistance Modified independent, requires aide device or extra time  Assistive Device Front wheel walker  Distance Ambulated (ft) 160 ft  Activity Response Tolerated well  Mobility Referral Yes  $Mobility charge 1 Mobility   Pt resting in bed on 1L upon entry. Pt STS and ambulates to hallway around [place]. Pt desats to 86% at 80 ft. Pt increased to 2L at 80 ft until back to bed SpO2 level increased to 90%. Pt left in bed on 1L and SpO2 increased to 94% after 2-3 minutes of seated rest EOB. Pt left in bed with needs in reach and family present.   Loma Sender Mobility Specialist 01/18/23, 5:11 PM

## 2023-01-18 NOTE — Progress Notes (Signed)
Nurse requested Mobility Specialist to perform oxygen saturation test with pt which includes removing pt from oxygen both at rest and while ambulating.  Below are the results from that testing.     Patient Saturations on Room Air at Rest = spO2 91%  Patient Saturations on Room Air while Ambulating = sp02 86% .  Rested and performed pursed lip breathing for 1 minute with sp02 at 90%.  Patient Saturations on 0 Liters of oxygen while Ambulating = sp02 86% (without talking); 83% (while talking)  At end of testing pt left in room on 0  Liters of oxygen.  Reported results to nurse and MD present.   Loma Sender Mobility Specialist 01/18/23, 11:02 AM

## 2023-01-18 NOTE — Progress Notes (Signed)
Mobility Specialist - Progress Note    01/18/23 1600  Mobility  Activity Ambulated independently to bathroom;Stood at bedside  Level of Assistance Modified independent, requires aide device or extra time  Assistive Device Front wheel walker  Distance Ambulated (ft) 10 ft  Activity Response Tolerated well  Mobility Referral Yes  $Mobility charge 1 Mobility   Pt resting in bed on 1L upon entry. Pt STS and ambulates to bathroom with RW ModI. Pt returned to bed and left with needs in reach. Family at bedside.   Loma Sender Mobility Specialist 01/18/23, 4:55 PM

## 2023-01-19 DIAGNOSIS — J441 Chronic obstructive pulmonary disease with (acute) exacerbation: Secondary | ICD-10-CM | POA: Diagnosis not present

## 2023-01-19 DIAGNOSIS — J189 Pneumonia, unspecified organism: Secondary | ICD-10-CM | POA: Diagnosis not present

## 2023-01-19 DIAGNOSIS — J9601 Acute respiratory failure with hypoxia: Secondary | ICD-10-CM | POA: Diagnosis not present

## 2023-01-19 DIAGNOSIS — R0781 Pleurodynia: Secondary | ICD-10-CM | POA: Diagnosis not present

## 2023-01-19 LAB — CULTURE, BLOOD (ROUTINE X 2)
Culture: NO GROWTH
Culture: NO GROWTH
Special Requests: ADEQUATE

## 2023-01-19 LAB — CBC
HCT: 30.5 % — ABNORMAL LOW (ref 36.0–46.0)
Hemoglobin: 10.3 g/dL — ABNORMAL LOW (ref 12.0–15.0)
MCH: 28.8 pg (ref 26.0–34.0)
MCHC: 33.8 g/dL (ref 30.0–36.0)
MCV: 85.2 fL (ref 80.0–100.0)
Platelets: 591 10*3/uL — ABNORMAL HIGH (ref 150–400)
RBC: 3.58 MIL/uL — ABNORMAL LOW (ref 3.87–5.11)
RDW: 15.4 % (ref 11.5–15.5)
WBC: 19.2 10*3/uL — ABNORMAL HIGH (ref 4.0–10.5)
nRBC: 0 % (ref 0.0–0.2)

## 2023-01-19 LAB — BASIC METABOLIC PANEL
Anion gap: 9 (ref 5–15)
BUN: 12 mg/dL (ref 8–23)
CO2: 30 mmol/L (ref 22–32)
Calcium: 8.4 mg/dL — ABNORMAL LOW (ref 8.9–10.3)
Chloride: 96 mmol/L — ABNORMAL LOW (ref 98–111)
Creatinine, Ser: 0.34 mg/dL — ABNORMAL LOW (ref 0.44–1.00)
GFR, Estimated: >60 mL/min (ref >60)
Glucose, Bld: 85 mg/dL (ref 70–99)
Potassium: 4.1 mmol/L (ref 3.5–5.1)
Sodium: 135 mmol/L (ref 135–145)

## 2023-01-19 MED ORDER — AMOXICILLIN-POT CLAVULANATE 875-125 MG PO TABS
1.0000 | ORAL_TABLET | Freq: Two times a day (BID) | ORAL | Status: DC
  Administered 2023-01-20: 1 via ORAL
  Filled 2023-01-19: qty 1

## 2023-01-19 NOTE — Care Management Important Message (Signed)
Important Message  Patient Details  Name: Yvonne Pitts MRN: [id number] Date of Birth: [birth year]   Medicare Important Message Given:  Yes     Dannette Barbara 01/19/2023, 11:19 AM

## 2023-01-19 NOTE — Progress Notes (Signed)
Physical Therapy Treatment Patient Details Name: Yvonne Pitts MRN: [id number] DOB: January 13, 1956 Today's Date: 01/19/2023   History of Present Illness Pt is a 67 y.o. female presenting to hospital 01/14/23 s/p fall (fell forward putting on shoes and hit her head); pt with fevers, cough, and decreased appetite.  Recent discharge from in patient psych 01/09/23 (for manic behavior).  Pt admitted with multifocal PNA, hypokalemia, and centrilobular emphysema.  PMH includes bipolar disorder, htn, COPD, ruptured cervical disc, scoliosis, neck sx.    PT Comments    Pt resting in bed upon PT arrival and agreeable to therapy; pt's husband and son present during session.  Pt modified independent with bed mobility; SBA with transfers using RW; and CGA progressing to SBA ambulating 200 feet with RW use.  Pt initially on 2 L O2 via nasal cannula with ambulation but O2 sats decreased to 87%; therapist increased O2 to 3 L (for ambulation) and pt's O2 sats 90% or greater rest of ambulation (nurse notified).  Pt declined further activity d/t fatigue.  Reviewed pacing and activity modification with pt and pt's husband: both verbalizing appropriate understanding.  Will continue to focus on strengthening, endurance, and progressive functional mobility during hospitalization.   Recommendations for follow up therapy are one component of a multi-disciplinary discharge planning process, led by the attending physician.  Recommendations may be updated based on patient status, additional functional criteria and insurance authorization.  Follow Up Recommendations  Home health PT     Assistance Recommended at Discharge Intermittent Supervision/Assistance  Patient can return home with the following A little help with walking and/or transfers;A little help with bathing/dressing/bathroom;Assistance with cooking/housework;Assist for transportation;Help with stairs or ramp for entrance   Equipment Recommendations  Rolling walker (2  wheels)    Recommendations for Other Services OT consult     Precautions / Restrictions Precautions Precautions: Fall Restrictions Weight Bearing Restrictions: No     Mobility  Bed Mobility Overal bed mobility: Modified Independent             General bed mobility comments: Semi-supine to/from sitting with HOB elevated without any noted difficulties.    Transfers Overall transfer level: Needs assistance Equipment used: Rolling walker (2 wheels) Transfers: Sit to/from Stand Sit to Stand: Supervision           General transfer comment: steady standing from bed x1 trial    Ambulation/Gait Ambulation/Gait assistance: Min guard, Supervision Gait Distance (Feet): 200 Feet Assistive device: Rolling walker (2 wheels) Gait Pattern/deviations: Step-through pattern Gait velocity: decreased     General Gait Details: steady with RW use; occasional vc's for upright posture (pt with flexed trunk today)   Stairs             Wheelchair Mobility    Modified Rankin (Stroke Patients Only)       Balance Overall balance assessment: Needs assistance Sitting-balance support: No upper extremity supported, Feet supported Sitting balance-Leahy Scale: Good Sitting balance - Comments: steady sitting reaching within BOS   Standing balance support: Bilateral upper extremity supported, During functional activity, Reliant on assistive device for balance Standing balance-Leahy Scale: Good Standing balance comment: steady ambulating with RW                            Cognition Arousal/Alertness: Awake/alert Behavior During Therapy: WFL for tasks assessed/performed Overall Cognitive Status: Within Functional Limits for tasks assessed  Exercises      General Comments  Nursing cleared pt for participation in physical therapy.  Pt agreeable to PT session.      Pertinent Vitals/Pain Pain  Assessment Pain Assessment: No/denies pain Pain Intervention(s): Limited activity within patient's tolerance, Monitored during session, Repositioned HR WFL during sessions activities.    Home Living                          Prior Function            PT Goals (current goals can now be found in the care plan section) Acute Rehab PT Goals Patient Stated Goal: to improve mobility and balance PT Goal Formulation: With patient Time For Goal Achievement: 01/29/23 Potential to Achieve Goals: Good Progress towards PT goals: Progressing toward goals    Frequency    Min 2X/week      PT Plan Current plan remains appropriate    Co-evaluation              AM-PAC PT "6 Clicks" Mobility   Outcome Measure  Help needed turning from your back to your side while in a flat bed without using bedrails?: None Help needed moving from lying on your back to sitting on the side of a flat bed without using bedrails?: None Help needed moving to and from a bed to a chair (including a wheelchair)?: A Little Help needed standing up from a chair using your arms (e.g., wheelchair or bedside chair)?: A Little Help needed to walk in hospital room?: A Little Help needed climbing 3-5 steps with a railing? : A Little 6 Click Score: 20    End of Session Equipment Utilized During Treatment: Gait belt;Oxygen Activity Tolerance: Patient tolerated treatment well Patient left: in bed;with call bell/phone within reach;with bed alarm set;with family/visitor present Nurse Communication: Mobility status;Precautions;Other (comment) (pt's O2 sats/needs during session) PT Visit Diagnosis: Other abnormalities of gait and mobility (R26.89);Muscle weakness (generalized) (M62.81);History of falling (Z91.81)     Time: 1022-1040 PT Time Calculation (min) (ACUTE ONLY): 18 min  Charges:  $Therapeutic Activity: 8-22 mins                     Leitha Bleak, PT 01/19/23, 10:55 AM

## 2023-01-19 NOTE — Progress Notes (Signed)
Occupational Therapy Treatment Patient Details Name: LESHEA JAGGERS MRN: [id number] DOB: 10/05/1956 Today's Date: 01/19/2023   History of present illness Pt is a 67 y.o. female presenting to hospital 01/14/23 s/p fall (fell forward putting on shoes and hit her head); pt with fevers, cough, and decreased appetite.  Recent discharge from in patient psych 01/09/23 (for manic behavior).  Pt admitted with multifocal PNA, hypokalemia, and centrilobular emphysema.  PMH includes bipolar disorder, htn, COPD, ruptured cervical disc, scoliosis, neck sx.   OT comments  Pt received semi-reclined in bed; doing RT tx; RT left soon after OT arrived; pt on 3L O2 throughout session. Appearing alert and pleasant; willing to work with OT on ADLs. Mobilized to bathroom with RW and supervision. See flowsheet below for further details of session. Left in recliner with all needs in reach.  Patient will benefit from continued OT while in acute care.    Recommendations for follow up therapy are one component of a multi-disciplinary discharge planning process, led by the attending physician.  Recommendations may be updated based on patient status, additional functional criteria and insurance authorization.    Follow Up Recommendations  Home health OT     Assistance Recommended at Discharge Intermittent Supervision/Assistance  Patient can return home with the following  A little help with bathing/dressing/bathroom;Assistance with cooking/housework   Equipment Recommendations  None recommended by OT    Recommendations for Other Services      Precautions / Restrictions Precautions Precautions: Fall Restrictions Weight Bearing Restrictions: No       Mobility Bed Mobility Overal bed mobility: Modified Independent                  Transfers Overall transfer level: Needs assistance Equipment used: Rolling walker (2 wheels) Transfers: Sit to/from Stand Sit to Stand: Supervision           General  transfer comment: pt walked to bathroom with supervision RW; t/f to sitting at EOB, then walked around bed to chair at end of session.     Balance Overall balance assessment: Needs assistance         Standing balance support: Bilateral upper extremity supported, During functional activity, Reliant on assistive device for balance Standing balance-Leahy Scale: Good                             ADL either performed or assessed with clinical judgement   ADL Overall ADL's : Needs assistance/impaired     Grooming: Oral care;Wash/dry hands;Wash/dry face;Standing;Supervision/safety                   Toilet Transfer: Supervision/safety;Rolling walker (2 wheels);Regular Toilet;Grab bars   Toileting- Clothing Manipulation and Hygiene: Supervision/safety;Sit to/from stand              Extremity/Trunk Assessment Upper Extremity Assessment Upper Extremity Assessment: Overall WFL for tasks assessed   Lower Extremity Assessment Lower Extremity Assessment: Overall WFL for tasks assessed        Vision       Perception     Praxis      Cognition Arousal/Alertness: Awake/alert Behavior During Therapy: WFL for tasks assessed/performed Overall Cognitive Status: Within Functional Limits for tasks assessed                                 General Comments: Pt appearing very pleasant and happy in general during session.  Exercises      Shoulder Instructions       General Comments Pt agreeable to ADLs during session today; walked into bathroom to toilet; stood at sink for grooming; ended session seated in recliner with all needs in reach. Chair alarm on.    Pertinent Vitals/ Pain       Pain Assessment Pain Assessment: No/denies pain  Home Living                                          Prior Functioning/Environment              Frequency  Min 2X/week        Progress Toward Goals  OT Goals(current goals  can now be found in the care plan section)  Progress towards OT goals: Progressing toward goals  Acute Rehab OT Goals Patient Stated Goal: Go home. OT Goal Formulation: With patient Time For Goal Achievement: 01/30/23 Potential to Achieve Goals: Good ADL Goals Pt Will Perform Grooming: Independently;standing Pt Will Transfer to Toilet: Independently;regular height toilet;ambulating Pt Will Perform Tub/Shower Transfer: Independently;ambulating  Plan Discharge plan remains appropriate    Co-evaluation                 AM-PAC OT "6 Clicks" Daily Activity     Outcome Measure   Help from another person eating meals?: None Help from another person taking care of personal grooming?: None Help from another person toileting, which includes using toliet, bedpan, or urinal?: None Help from another person bathing (including washing, rinsing, drying)?: A Little Help from another person to put on and taking off regular upper body clothing?: None Help from another person to put on and taking off regular lower body clothing?: A Little 6 Click Score: 22    End of Session Equipment Utilized During Treatment: Rolling walker (2 wheels)  OT Visit Diagnosis: Unsteadiness on feet (R26.81);Muscle weakness (generalized) (M62.81);Pain   Activity Tolerance Patient tolerated treatment well   Patient Left in chair;with chair alarm set;with call bell/phone within reach   Nurse Communication Mobility status        Time: 6701-4103 OT Time Calculation (min): 19 min  Charges: OT General Charges $OT Visit: 1 Visit OT Treatments $Self Care/Home Management : 8-22 mins  Waymon Amato, MS, OTR/L   Vania Rea 01/19/2023, 4:05 PM

## 2023-01-19 NOTE — Progress Notes (Addendum)
SATURATION QUALIFICATIONS: (This note is used to comply with regulatory documentation for home oxygen)  Patient Saturations on Room Air at Rest = 80%  Patient Saturations on Room Air while Ambulating = n/a%  Patient Saturations on 3 Liters of oxygen while Ambulating = 90%  Please briefly explain why patient needs home oxygen:

## 2023-01-19 NOTE — Progress Notes (Signed)
Progress Note   Patient: Yvonne Pitts NKN:[id number] DOB: 07/27/1956 DOA: 01/14/2023     5 DOS: the patient was seen and examined on 01/19/2023   Brief hospital course: 67 y.o. female with medical history significant for bipolar disorder, nicotine dependence, hypertension, CAD, COPD who was brought into the hospital by her husband at the request of her psychiatrist. Patient was discharged from the [facility] psych unit after hospitalization for 1 week for manic behavior and was discharged home on 01/09/23.  Patient's husband states that she did well until 3 days prior to this admission when she developed chills at home, decreased appetite and increased lethargy.  He took her to follow-up with the psychiatrist to get her Depakote level checked.  He states that the psychiatrist contacted them prior to her admission and told them she had a high white count and needed to follow-up with her primary care provider. He took the patient to urgent care on the morning of admission to the ER for further evaluation. Patient has had a cough productive of greenish phlegm for several days and has had fever and chills at home.  She also complains of pleuritic chest pain. Patient fell on the day of admission while getting ready to go to the urgent care center.  She denied feeling dizzy or lightheaded and was trying to fix her pant legs when she face planted.  She has a bruise to her nasal bridge and upper lip. She denies having any nausea, no vomiting, no abdominal pain, no changes in her bowel habits, no headache, no blurred vision, no focal deficit. Abnormal labs include a potassium of 3.4, white count of 18.3, sodium of 132 Chest x-ray shows patchy bilateral lower lobe airspace disease concerning for multilobar pneumonia. Patient received a dose of Rocephin and Zithromax.  1/18.  Passed swallow evaluation.  Started on Solu-Medrol for bronchospasm. 1/19.  Patient complaining of left rib pain and coughing a lot.   Incentive spirometer ordered.  Chest x-ray and rib x-ray for tomorrow morning. 1/20.  Pulse ox dropped down to 83% with ambulation today with nursing staff. 1/21.  Pulse ox dropped down to 83% with ambulation with talking.  Only down to 86% with ambulation only. 1/22.  Nursing staff stated that pulse ox was 80% at rest.  Assessment and Plan: * Acute respiratory failure with hypoxia ([place]) Patient had a pulse ox of 85% on 4 L.  Was as high as 6 L during hospital course.  Patient did drop her pulse ox down to 83% on room air with ambulation with talking and walking.  86% on room air with ambulation with just walking.  At rest came up to 90 but then notified that at rest she was down at 86% on room air.  Having difficulty tapering off oxygen.  Will obtain an ABG tomorrow morning on room air.  Multifocal pneumonia Completed Rocephin and Zithromax 5 days.  Switch over to Augmentin for 2 more days.  Pleuritic chest pain Continue Solu-Medrol, as needed pain medication, as needed Tylenol.   COPD with acute exacerbation (HCC) Continue Solu-Medrol.  Continue nebulizer treatments and inhalers.  Tobacco abuse Nicotine patch  Bipolar affective disorder, currently manic, moderate (HCC) Recent hospitalization in the [facility] psych unit for manic behavior Continue Depakote, citalopram and Seroquel  Weakness Patient had fall at home with a abrasion on her nose.  PT recommending home health.  Hypokalemia Replaced  Essential hypertension On Norvasc.  GERD (gastroesophageal reflux disease) Continue PPI  Subjective: Patient is hoping to go home on oxygen.  Feeling better than when she came in.  Still dropping her oxygen saturations down with ambulation.  Physical Exam: Vitals:   01/19/23 0908 01/19/23 0911 01/19/23 1332 01/19/23 1559  BP:    (!) 112/54  Pulse: 79 75  70  Resp:    20  Temp:    97.8 F (36.6 C)  TempSrc:    Oral  SpO2: (!) 80% 94% 94% 96%  Weight:      Height:        Physical Exam HENT:     Head: Normocephalic.     Mouth/Throat:     Pharynx: No oropharyngeal exudate.  Eyes:     General: Lids are normal.     Conjunctiva/sclera: Conjunctivae normal.  Cardiovascular:     Rate and Rhythm: Normal rate and regular rhythm.     Heart sounds: Normal heart sounds, S1 normal and S2 normal.  Pulmonary:     Breath sounds: Examination of the right-lower field reveals decreased breath sounds. Examination of the left-lower field reveals decreased breath sounds. Decreased breath sounds present. No wheezing, rhonchi or rales.  Abdominal:     Palpations: Abdomen is soft.     Tenderness: There is no abdominal tenderness.  Musculoskeletal:     Right lower leg: No swelling.     Left lower leg: No swelling.  Skin:    General: Skin is warm.     Findings: No rash.     Comments: Abrasion on the bridge of her nose.  Neurological:     Mental Status: She is alert and oriented to person, place, and time.     Data Reviewed: Hb 10.3, WBC 19.2. Plt 591, cr 0.34  Family Communication: Spoke with family at bedside  Disposition: Status is: Inpatient Remains inpatient appropriate because: Patient hopeful to get off oxygen but high likelihood that she will go home with oxygen.  Planned Discharge Destination: Home with Home Health    Time spent: 27 minutes  Author: Loletha Grayer, MD 01/19/2023 6:22 PM  For on call review [email or web address].

## 2023-01-20 ENCOUNTER — Encounter: Payer: Self-pay | Admitting: Internal Medicine

## 2023-01-20 ENCOUNTER — Inpatient Hospital Stay: Payer: Medicare HMO

## 2023-01-20 DIAGNOSIS — J9621 Acute and chronic respiratory failure with hypoxia: Secondary | ICD-10-CM | POA: Diagnosis not present

## 2023-01-20 DIAGNOSIS — R0781 Pleurodynia: Secondary | ICD-10-CM | POA: Diagnosis not present

## 2023-01-20 DIAGNOSIS — R7989 Other specified abnormal findings of blood chemistry: Secondary | ICD-10-CM | POA: Insufficient documentation

## 2023-01-20 DIAGNOSIS — J189 Pneumonia, unspecified organism: Secondary | ICD-10-CM | POA: Diagnosis not present

## 2023-01-20 DIAGNOSIS — J441 Chronic obstructive pulmonary disease with (acute) exacerbation: Secondary | ICD-10-CM | POA: Diagnosis not present

## 2023-01-20 LAB — CBC
HCT: 31.4 % — ABNORMAL LOW (ref 36.0–46.0)
Hemoglobin: 10.7 g/dL — ABNORMAL LOW (ref 12.0–15.0)
MCH: 29.2 pg (ref 26.0–34.0)
MCHC: 34.1 g/dL (ref 30.0–36.0)
MCV: 85.6 fL (ref 80.0–100.0)
Platelets: 599 10*3/uL — ABNORMAL HIGH (ref 150–400)
RBC: 3.67 MIL/uL — ABNORMAL LOW (ref 3.87–5.11)
RDW: 15.6 % — ABNORMAL HIGH (ref 11.5–15.5)
WBC: 19.8 10*3/uL — ABNORMAL HIGH (ref 4.0–10.5)
nRBC: 0 % (ref 0.0–0.2)

## 2023-01-20 LAB — BLOOD GAS, ARTERIAL
Acid-Base Excess: 12.1 mmol/L — ABNORMAL HIGH (ref 0.0–2.0)
Bicarbonate: 37.8 mmol/L — ABNORMAL HIGH (ref 20.0–28.0)
O2 Content: 2 L/min
O2 Saturation: 96.6 %
Patient temperature: 37
pCO2 arterial: 52 mmHg — ABNORMAL HIGH (ref 32–48)
pH, Arterial: 7.47 — ABNORMAL HIGH (ref 7.35–7.45)
pO2, Arterial: 79 mmHg — ABNORMAL LOW (ref 83–108)

## 2023-01-20 LAB — BASIC METABOLIC PANEL
Anion gap: 8 (ref 5–15)
BUN: 10 mg/dL (ref 8–23)
CO2: 32 mmol/L (ref 22–32)
Calcium: 8.7 mg/dL — ABNORMAL LOW (ref 8.9–10.3)
Chloride: 97 mmol/L — ABNORMAL LOW (ref 98–111)
Creatinine, Ser: 0.37 mg/dL — ABNORMAL LOW (ref 0.44–1.00)
GFR, Estimated: >60 mL/min (ref >60)
Glucose, Bld: 89 mg/dL (ref 70–99)
Potassium: 4.3 mmol/L (ref 3.5–5.1)
Sodium: 137 mmol/L (ref 135–145)

## 2023-01-20 LAB — D-DIMER, QUANTITATIVE: D-Dimer, Quant: 1.79 ug/mL-FEU — ABNORMAL HIGH (ref 0.00–0.50)

## 2023-01-20 MED ORDER — GUAIFENESIN ER 600 MG PO TB12: 600.0000 mg | ORAL_TABLET | Freq: Two times a day (BID) | ORAL | 0 refills | Status: AC

## 2023-01-20 MED ORDER — NICOTINE 21 MG/24HR TD PT24: MEDICATED_PATCH | TRANSDERMAL | 0 refills | Status: AC

## 2023-01-20 MED ORDER — IOHEXOL 350 MG/ML SOLN
60.0000 mL | Freq: Once | INTRAVENOUS | Status: AC | PRN
  Administered 2023-01-20: 47 mL via INTRAVENOUS

## 2023-01-20 MED ORDER — GUAIFENESIN-DM 100-10 MG/5ML PO SYRP: 5.0000 mL | ORAL_SOLUTION | ORAL | 0 refills | Status: AC | PRN

## 2023-01-20 MED ORDER — AMOXICILLIN-POT CLAVULANATE 875-125 MG PO TABS: 1.0000 | ORAL_TABLET | Freq: Two times a day (BID) | ORAL | 0 refills | Status: AC

## 2023-01-20 MED ORDER — OXYCODONE HCL 5 MG PO TABS: 2.5000 mg | ORAL_TABLET | Freq: Four times a day (QID) | ORAL | 0 refills | Status: DC | PRN

## 2023-01-20 NOTE — Progress Notes (Signed)
Discharge instructions reviewed with patient and husband including followup visits and new medications.  Understanding was verbalized and all questions were answered.  IVs removed without complication; patient tolerated well.  Patient discharged home via wheelchair in stable condition escorted by nursing staff.

## 2023-01-20 NOTE — TOC Progression Note (Signed)
Transition of Care [facility]) - Progression Note    Patient Details  Name: Yvonne Pitts MRN: [id number] Date of Birth: [birth year]  Transition of Care [facility]) CM/SW Contact  Beverly Sessions, RN Phone Number: 01/20/2023, 9:31 AM  Clinical Narrative:     Per MD anticipated DC today with home O2 Referral made to [facility] with [employer] for O2, and portable O2 to be delivered to room Patient confirms RW has been delivered to room Husband to transport at bedside   Expected Discharge Plan: [place] Barriers to Discharge: Continued Medical Work up  Expected Discharge Plan and [place] arrangements for the past 2 months: Single Family Home                 DME Arranged: Gilford Rile rolling DME Agency: [employer] Date DME Agency Contacted: 01/16/23   Representative spoke with at DME Agency: [place]: PT [place]: [place] Date [place]: 01/16/23   Representative spoke with at [place]: [place]   Social Determinants of Health ([place]) Interventions SDOH Screenings   Food Insecurity: No Food Insecurity (01/15/2023)  Housing: Low Risk  (01/15/2023)  Transportation Needs: No Transportation Needs (01/15/2023)  Utilities: Not At Risk (01/15/2023)  Alcohol Screen: Low Risk  (01/02/2023)  Depression (PHQ2-9): High Risk (01/12/2023)  Tobacco Use: High Risk (01/14/2023)    Readmission Risk Interventions     No data to display

## 2023-01-20 NOTE — Discharge Summary (Signed)
Physician Discharge Summary   Patient: Yvonne Pitts MRN: [id number] DOB: [birth year]  Admit date:     01/14/2023  Discharge date: 01/20/23  Discharge Physician: Loletha Grayer   PCP: Tracie Harrier, MD   Recommendations at discharge:   Follow-up PCP 5 days  Discharge Diagnoses: Principal Problem:   Acute on chronic respiratory failure with hypoxia (HCC) Active Problems:   Multifocal pneumonia   Pleuritic chest pain   COPD with acute exacerbation (HCC)   Tobacco abuse   Bipolar affective disorder, currently manic, moderate (HCC)   GERD (gastroesophageal reflux disease)   Essential hypertension   Hypokalemia   Weakness   Elevated d-dimer    Hospital Course: 67 y.o. female with medical history significant for bipolar disorder, nicotine dependence, hypertension, CAD, COPD who was brought into the hospital by her husband at the request of her psychiatrist. Patient was discharged from the [facility] psych unit after hospitalization for 1 week for manic behavior and was discharged home on 01/09/23.  Patient's husband states that she did well until 3 days prior to this admission when she developed chills at home, decreased appetite and increased lethargy.  He took her to follow-up with the psychiatrist to get her Depakote level checked.  He states that the psychiatrist contacted them prior to her admission and told them she had a high white count and needed to follow-up with her primary care provider. He took the patient to urgent care on the morning of admission to the ER for further evaluation. Patient has had a cough productive of greenish phlegm for several days and has had fever and chills at home.  She also complains of pleuritic chest pain. Patient fell on the day of admission while getting ready to go to the urgent care center.  She denied feeling dizzy or lightheaded and was trying to fix her pant legs when she face planted.  She has a bruise to her nasal bridge and upper lip. She  denies having any nausea, no vomiting, no abdominal pain, no changes in her bowel habits, no headache, no blurred vision, no focal deficit. Abnormal labs include a potassium of 3.4, white count of 18.3, sodium of 132 Chest x-ray shows patchy bilateral lower lobe airspace disease concerning for multilobar pneumonia. Patient received a dose of Rocephin and Zithromax.  1/18.  Passed swallow evaluation.  Started on Solu-Medrol for bronchospasm. 1/19.  Patient complaining of left rib pain and coughing a lot.  Incentive spirometer ordered.  Chest x-ray and rib x-ray for tomorrow morning. 1/20.  Pulse ox dropped down to 83% with ambulation today with nursing staff. 1/21.  Pulse ox dropped down to 83% with ambulation with talking.  Only down to 86% with ambulation only. 1/22.  Nursing staff stated that pulse ox was 80% at rest. 1/23.  Patient qualifies for oxygen at home and set up with home oxygen.  D-dimer borderline high and CT scan of the chest negative for pulmonary embolism and did show multifocal pneumonia and ultrasound lower extremities negative for DVT.  Patient feeling better and wanted to go home.  Assessment and Plan: * Acute on chronic respiratory failure with hypoxia ([place]) Patient had a pulse ox of 85% on 4 L.  Was as high as 6 L during hospital course.  Patient did drop her pulse ox down to 83% on room air with ambulation with talking and walking.  86% on room air with ambulation with just walking.  At rest came up to 13 but then notified that  at rest she was down at 86% on room air.  We are unable to taper off oxygen during the hospital course now has chronic respiratory failure.  Patient will go home with 2 L at rest and 3 L with ambulation.  Multifocal pneumonia Completed Rocephin and Zithromax 5 days.  Switched over to Augmentin for 2 more days.  Pleuritic chest pain Will give prednisone taper.  As needed pain medication, as needed Tylenol.   COPD with acute exacerbation ([place]) Will  give a prednisone taper upon discharge.  Continue inhalers  Tobacco abuse Nicotine patch.  Patient advised that she cannot smoke with oxygen.  Bipolar affective disorder, currently manic, moderate ([place]) Recent hospitalization in the [facility] psych unit for manic behavior Continue Depakote, citalopram and Seroquel  Elevated d-dimer CT scan negative for pulmonary embolism and lower extremity ultrasound negative for DVT.  Weakness Patient had fall at home with a abrasion on her nose.  PT recommending home health.  Hypokalemia Replaced  Essential hypertension On Norvasc.  GERD (gastroesophageal reflux disease) Continue PPI         Consultants: None Procedures performed: None Disposition: Home health Diet recommendation:  Cardiac diet DISCHARGE MEDICATION: Allergies as of 01/20/2023       Reactions   Prochlorperazine Maleate    Other reaction(s): Unknown   Ibuprofen Rash        Medication List     TAKE these medications    Albuterol Sulfate 108 (90 Base) MCG/ACT Aepb Commonly known as: ProAir RespiClick Inhale 2 puffs into the lungs every 6 (six) hours as needed.   alendronate 70 MG tablet Commonly known as: FOSAMAX Take 70 mg by mouth once a week. Take with a full glass of water on an empty stomach.   amLODipine 5 MG tablet Commonly known as: NORVASC Take 1 tablet (5 mg total) by mouth daily.   amoxicillin-clavulanate 875-125 MG tablet Commonly known as: AUGMENTIN Take 1 tablet by mouth every 12 (twelve) hours for 3 doses.   budesonide-formoterol 160-4.5 MCG/ACT inhaler Commonly known as: SYMBICORT Inhale 2 puffs into the lungs 2 (two) times daily.   citalopram 20 MG tablet Commonly known as: CELEXA Take 1 tablet (20 mg total) by mouth daily.   cyanocobalamin 1000 MCG tablet Take 1 tablet (1,000 mcg total) by mouth daily.   divalproex 250 MG DR tablet Commonly known as: DEPAKOTE Take 1 tablet (250 mg total) by mouth 2 (two) times daily at 8 am  and 4 pm.   guaiFENesin 600 MG 12 hr tablet Commonly known as: MUCINEX Take 1 tablet (600 mg total) by mouth 2 (two) times daily for 5 days.   guaiFENesin-dextromethorphan 100-10 MG/5ML syrup Commonly known as: ROBITUSSIN DM Take 5 mLs by mouth every 4 (four) hours as needed for cough.   nicotine 21 mg/24hr patch Commonly known as: NICODERM CQ - dosed in mg/24 hours One '21mg'$  patch chest wall daily (okay to substitute generic) Start taking on: January 21, 2023   omeprazole 20 MG capsule Commonly known as: PRILOSEC Take 20 mg by mouth daily.   oxyCODONE 5 MG immediate release tablet Commonly known as: Oxy IR/ROXICODONE Take 0.5 tablets (2.5 mg total) by mouth every 6 (six) hours as needed for severe pain or moderate pain.   QUEtiapine 50 MG tablet Commonly known as: SEROQUEL Take 3 tablets (150 mg total) by mouth at bedtime.   terbinafine 250 MG tablet Commonly known as: LAMISIL Take 250 mg by mouth daily.  Durable Medical Equipment  (From admission, onward)           Start     Ordered   01/20/23 0924  For home use only DME oxygen  Once       Comments: 2 Liters at rest and 3 with ambulation  Question Answer Comment  Length of Need Lifetime   Mode or (Route) Nasal cannula   Liters per Minute 2   Frequency Continuous (stationary and portable oxygen unit needed)   Oxygen conserving device Yes   Oxygen delivery system Gas      01/20/23 0924   01/16/23 0924  For home use only DME Walker rolling  Once       Question Answer Comment  Walker: With [place]   Patient needs a walker to treat with the following condition Unsteady gait when walking      01/16/23 0924            Follow-up Information     Tracie Harrier, MD. Go on 01/26/2023.   Specialty: Internal Medicine Why: Go at 10:00am you will see the PA Rob Tubi. Contact information: [place] [address] [phone]                Discharge  Exam: Danley Danker Weights   01/15/23 0432  Weight: 46.7 kg   Physical Exam HENT:     Head: Normocephalic.     Mouth/Throat:     Pharynx: No oropharyngeal exudate.  Eyes:     General: Lids are normal.     Conjunctiva/sclera: Conjunctivae normal.  Cardiovascular:     Rate and Rhythm: Normal rate and regular rhythm.     Heart sounds: Normal heart sounds, S1 normal and S2 normal.  Pulmonary:     Breath sounds: Examination of the right-lower field reveals decreased breath sounds. Examination of the left-lower field reveals decreased breath sounds. Decreased breath sounds present. No wheezing, rhonchi or rales.  Abdominal:     Palpations: Abdomen is soft.     Tenderness: There is no abdominal tenderness.  Musculoskeletal:     Right lower leg: No swelling.     Left lower leg: No swelling.  Skin:    General: Skin is warm.     Findings: No rash.     Comments: Abrasion on the bridge of her nose.  Neurological:     Mental Status: She is alert and oriented to person, place, and time.      Condition at discharge: stable  The results of significant diagnostics from this hospitalization (including imaging, microbiology, ancillary and laboratory) are listed below for reference.   Imaging Studies: US Venous Img Lower Bilateral (DVT)  Result Date: 01/20/2023 CLINICAL DATA:  Lower extremity edema, elevated D-dimer EXAM: BILATERAL LOWER EXTREMITY VENOUS DOPPLER ULTRASOUND TECHNIQUE: Gray-scale sonography with graded compression, as well as color Doppler and duplex ultrasound were performed to evaluate the lower extremity deep venous systems from the level of the common femoral vein and including the common femoral, femoral, profunda femoral, popliteal and calf veins including the posterior tibial, peroneal and gastrocnemius veins when visible. The superficial great saphenous vein was also interrogated. Spectral Doppler was utilized to evaluate flow at rest and with distal augmentation maneuvers in the  common femoral, femoral and popliteal veins. COMPARISON:  None Available. FINDINGS: RIGHT LOWER EXTREMITY Common Femoral Vein: No evidence of thrombus. Normal compressibility, respiratory phasicity and response to augmentation. Saphenofemoral Junction: No evidence of thrombus. Normal compressibility and flow on color Doppler  imaging. Profunda Femoral Vein: No evidence of thrombus. Normal compressibility and flow on color Doppler imaging. Femoral Vein: No evidence of thrombus. Normal compressibility, respiratory phasicity and response to augmentation. Popliteal Vein: No evidence of thrombus. Normal compressibility, respiratory phasicity and response to augmentation. Calf Veins: No evidence of thrombus. Normal compressibility and flow on color Doppler imaging. Superficial Great Saphenous Vein: No evidence of thrombus. Normal compressibility. Venous Reflux:  None. Other Findings:  None. LEFT LOWER EXTREMITY Common Femoral Vein: No evidence of thrombus. Normal compressibility, respiratory phasicity and response to augmentation. Saphenofemoral Junction: No evidence of thrombus. Normal compressibility and flow on color Doppler imaging. Profunda Femoral Vein: No evidence of thrombus. Normal compressibility and flow on color Doppler imaging. Femoral Vein: No evidence of thrombus. Normal compressibility, respiratory phasicity and response to augmentation. Popliteal Vein: No evidence of thrombus. Normal compressibility, respiratory phasicity and response to augmentation. Calf Veins: No evidence of thrombus. Normal compressibility and flow on color Doppler imaging. Superficial Great Saphenous Vein: No evidence of thrombus. Normal compressibility. Venous Reflux:  None. Other Findings:  None. IMPRESSION: No evidence of deep venous thrombosis in either lower extremity. Electronically Signed   By: Jacqulynn Cadet M.D.   On: 01/20/2023 15:59   CT Angio Chest Pulmonary Embolism (PE) W or WO Contrast  Result Date:  01/20/2023 CLINICAL DATA:  Pulmonary embolism suspected. Acute respiratory failure with hypoxia. EXAM: CT ANGIOGRAPHY CHEST WITH CONTRAST TECHNIQUE: Multidetector CT imaging of the chest was performed using the standard protocol during bolus administration of intravenous contrast. Multiplanar CT image reconstructions and MIPs were obtained to evaluate the vascular anatomy. RADIATION DOSE REDUCTION: This exam was performed according to the departmental dose-optimization program which includes automated exposure control, adjustment of the mA and/or kV according to patient size and/or use of iterative reconstruction technique. CONTRAST:  64m OMNIPAQUE IOHEXOL 350 MG/ML SOLN COMPARISON:  07/19/2022 FINDINGS: Cardiovascular: Small pericardial effusion is 1 centimeter. The heart is enlarged. Coronary artery calcifications are present. Pulmonary arteries are well opacified by contrast bolus. There is no acute pulmonary embolus. Mediastinum/Nodes: Esophagus is unremarkable. The visualized portion of the thyroid gland has a normal appearance. No significant mediastinal, hilar, or axillary adenopathy. Lungs/Pleura: Airways are patent. Lungs are hyperinflated. There are diffuse panlobular emphysematous changes primarily within the UPPER lobes. There are numerous patchy peripheral lung opacities throughout the lungs. Mucous plugging noted in LOWER lobes bilaterally. There is focal consolidation within the RIGHT middle lobe and MEDIAL lingula/LEFT UPPER lobe. There are small bilateral pleural effusions. Upper Abdomen: No acute abnormality. Musculoskeletal: No chest wall abnormality. No acute or significant osseous findings. Review of the MIP images confirms the above findings. IMPRESSION: 1. Technically adequate exam showing no acute pulmonary embolus. 2. Cardiomegaly and coronary artery disease. 3. Small pericardial effusion. 4. Small bilateral pleural effusions. 5. Bilateral LOWER lobe mucous plugging. 6. Scattered patchy  peripheral lung opacities, consistent with infectious/inflammatory process. 7. Focal consolidation within the RIGHT middle lobe and MEDIAL lingula/LEFT UPPER lobe. 8.  Emphysema (ICD10-J43.9). Electronically Signed   By: ENolon NationsM.D.   On: 01/20/2023 10:26   DG Chest Port 1 View  Result Date: 01/17/2023 CLINICAL DATA:  [id number] [id number]: PORTABLE CHEST 1 VIEW COMPARISON:  01/14/2023 FINDINGS: Background hyperinflation and pulmonary emphysema pattern better demonstrated by CTA comparison. Worsening asymmetric diffuse bilateral airspace process, more severe in the left lung compatible with bilateral pneumonia. Stable heart size and vascularity. No enlarging or significant effusion. Negative for pneumothorax. Trachea midline. IMPRESSION: 1. Worsening diffuse bilateral pneumonia pattern,  more severe on the left. 2. Emphysema. Electronically Signed   By: Jerilynn Mages.  Shick M.D.   On: 01/17/2023 09:19   DG Ribs Unilateral Left  Result Date: 01/17/2023 CLINICAL DATA:  Multifocal pneumonia.  Fall.  Left rib pain. EXAM: LEFT RIBS - 2 VIEW COMPARISON:  Chest radiograph 01/17/2023 FINDINGS: No definite displaced left rib fracture identified. Redemonstrated extensive consolidation throughout the visualized left lung. IMPRESSION: 1. No definite displaced left rib fracture identified. 2. Redemonstrated extensive consolidation throughout the visualized left lung. Electronically Signed   By: Lovey Newcomer M.D.   On: 01/17/2023 09:18   DG Chest 2 View  Result Date: 01/14/2023 CLINICAL DATA:  Shortness of breath, status post fall EXAM: CHEST - 2 VIEW COMPARISON:  07/16/2022 FINDINGS: Patchy bilateral lower lobe airspace disease concerning for multilobar pneumonia. Bilateral chronic interstitial thickening. Bilateral emphysematous changes. Trace right pleural effusion. No left pleural effusion. No pneumothorax. Heart and mediastinal contours are unremarkable. No acute osseous abnormality. IMPRESSION: 1. Patchy  bilateral lower lobe airspace disease concerning for multilobar pneumonia. Electronically Signed   By: Kathreen Devoid M.D.   On: 01/14/2023 13:21   CT HEAD WO CONTRAST  Result Date: 01/14/2023 CLINICAL DATA:  Head trauma, moderate to severe. Fall. Abrasion to bridge of nose and above lip. EXAM: CT HEAD WITHOUT CONTRAST CT CERVICAL SPINE WITHOUT CONTRAST TECHNIQUE: Multidetector CT imaging of the head and cervical spine was performed following the standard protocol without intravenous contrast. Multiplanar CT image reconstructions of the cervical spine were also generated. RADIATION DOSE REDUCTION: This exam was performed according to the departmental dose-optimization program which includes automated exposure control, adjustment of the mA and/or kV according to patient size and/or use of iterative reconstruction technique. COMPARISON:  CT head 08/07/2022. MRI cervical spine 09/08/2015. CT chest 07/19/2022. FINDINGS: CT HEAD FINDINGS Brain: No acute hemorrhage, mass effect or midline shift. Gray-white differentiation is preserved. No hydrocephalus. No extra-axial collection. Basilar cisterns are patent. Unchanged mild cerebellar tonsillar ectopia. Vascular: No hyperdense vessel or unexpected calcification. Skull: No calvarial fracture.  Skull base is unremarkable. Sinuses/Orbits: Paranasal sinuses, mastoid air cells, and middle ear cavities are well aerated. Orbits are unremarkable. Other: No scalp hematoma. CT CERVICAL SPINE FINDINGS Alignment: Unchanged 3 mm retrolisthesis of C4 on C5. No traumatic listhesis. Skull base and vertebrae: No acute fracture. Normal craniocervical junction. No suspicious bone lesions. Soft tissues and spinal canal: No prevertebral fluid or swelling. No visible canal hematoma. Disc levels: Multilevel spondylosis, worse at C4-5, where there is at least moderate spinal canal stenosis and severe right neural foraminal narrowing. Upper chest: Emphysema. Increased biapical scarring since the  prior chest CT from July of 2023. New tree-in-bud opacities may be of infectious or inflammatory etiology. Other: Atherosclerotic calcifications of the carotid bulbs. IMPRESSION: 1. No acute intracranial trauma. 2. No acute fracture or traumatic listhesis of the cervical spine. 3.  Emphysema (ICD10-J43.9). Electronically Signed   By: Emmit Alexanders M.D.   On: 01/14/2023 11:31   CT Cervical Spine Wo Contrast  Result Date: 01/14/2023 CLINICAL DATA:  Head trauma, moderate to severe. Fall. Abrasion to bridge of nose and above lip. EXAM: CT HEAD WITHOUT CONTRAST CT CERVICAL SPINE WITHOUT CONTRAST TECHNIQUE: Multidetector CT imaging of the head and cervical spine was performed following the standard protocol without intravenous contrast. Multiplanar CT image reconstructions of the cervical spine were also generated. RADIATION DOSE REDUCTION: This exam was performed according to the departmental dose-optimization program which includes automated exposure control, adjustment of the mA and/or kV according  to patient size and/or use of iterative reconstruction technique. COMPARISON:  CT head 08/07/2022. MRI cervical spine 09/08/2015. CT chest 07/19/2022. FINDINGS: CT HEAD FINDINGS Brain: No acute hemorrhage, mass effect or midline shift. Gray-white differentiation is preserved. No hydrocephalus. No extra-axial collection. Basilar cisterns are patent. Unchanged mild cerebellar tonsillar ectopia. Vascular: No hyperdense vessel or unexpected calcification. Skull: No calvarial fracture.  Skull base is unremarkable. Sinuses/Orbits: Paranasal sinuses, mastoid air cells, and middle ear cavities are well aerated. Orbits are unremarkable. Other: No scalp hematoma. CT CERVICAL SPINE FINDINGS Alignment: Unchanged 3 mm retrolisthesis of C4 on C5. No traumatic listhesis. Skull base and vertebrae: No acute fracture. Normal craniocervical junction. No suspicious bone lesions. Soft tissues and spinal canal: No prevertebral fluid or  swelling. No visible canal hematoma. Disc levels: Multilevel spondylosis, worse at C4-5, where there is at least moderate spinal canal stenosis and severe right neural foraminal narrowing. Upper chest: Emphysema. Increased biapical scarring since the prior chest CT from July of 2023. New tree-in-bud opacities may be of infectious or inflammatory etiology. Other: Atherosclerotic calcifications of the carotid bulbs. IMPRESSION: 1. No acute intracranial trauma. 2. No acute fracture or traumatic listhesis of the cervical spine. 3.  Emphysema (ICD10-J43.9). Electronically Signed   By: Emmit Alexanders M.D.   On: 01/14/2023 11:31    Microbiology: Results for orders placed or performed during the hospital encounter of 01/14/23  Resp panel by RT-PCR (RSV, Flu A&B, Covid) Anterior Nasal Swab     Status: None   Collection Time: 01/14/23  2:13 PM   Specimen: Anterior Nasal Swab  Result Value Ref Range Status   SARS Coronavirus 2 by RT PCR NEGATIVE NEGATIVE Final    Comment: (NOTE) SARS-CoV-2 target nucleic acids are NOT DETECTED.  The SARS-CoV-2 RNA is generally detectable in upper respiratory specimens during the acute phase of infection. The lowest concentration of SARS-CoV-2 viral copies this assay can detect is 138 copies/mL. A negative result does not preclude SARS-Cov-2 infection and should not be used as the sole basis for treatment or other patient management decisions. A negative result may occur with  improper specimen collection/handling, submission of specimen other than nasopharyngeal swab, presence of viral mutation(s) within the areas targeted by this assay, and inadequate number of viral copies(<138 copies/mL). A negative result must be combined with clinical observations, patient history, and epidemiological information. The expected result is Negative.  Fact Sheet for Patients:  EntrepreneurPulse.com.au  Fact Sheet for Healthcare Providers:   IncredibleEmployment.be  This test is no t yet approved or cleared by the [place] FDA and  has been authorized for detection and/or diagnosis of SARS-CoV-2 by FDA under an Emergency Use Authorization (EUA). This EUA will remain  in effect (meaning this test can be used) for the duration of the COVID-19 declaration under Section 564(b)(1) of the Act, 21 U.S.C.section 360bbb-3(b)(1), unless the authorization is terminated  or revoked sooner.       Influenza A by PCR NEGATIVE NEGATIVE Final   Influenza B by PCR NEGATIVE NEGATIVE Final    Comment: (NOTE) The Xpert Xpress SARS-CoV-2/FLU/RSV plus assay is intended as an aid in the diagnosis of influenza from Nasopharyngeal swab specimens and should not be used as a sole basis for treatment. Nasal washings and aspirates are unacceptable for Xpert Xpress SARS-CoV-2/FLU/RSV testing.  Fact Sheet for Patients: EntrepreneurPulse.com.au  Fact Sheet for Healthcare Providers: IncredibleEmployment.be  This test is not yet approved or cleared by the [place] FDA and has been authorized for detection and/or diagnosis  of SARS-CoV-2 by FDA under an Emergency Use Authorization (EUA). This EUA will remain in effect (meaning this test can be used) for the duration of the COVID-19 declaration under Section 564(b)(1) of the Act, 21 U.S.C. section 360bbb-3(b)(1), unless the authorization is terminated or revoked.     Resp Syncytial Virus by PCR NEGATIVE NEGATIVE Final    Comment: (NOTE) Fact Sheet for Patients: EntrepreneurPulse.com.au  Fact Sheet for Healthcare Providers: IncredibleEmployment.be  This test is not yet approved or cleared by the [place] FDA and has been authorized for detection and/or diagnosis of SARS-CoV-2 by FDA under an Emergency Use Authorization (EUA). This EUA will remain in effect (meaning this test can be used) for  the duration of the COVID-19 declaration under Section 564(b)(1) of the Act, 21 U.S.C. section 360bbb-3(b)(1), unless the authorization is terminated or revoked.  Performed at [facility], [address] [place], [place] [address]   Blood culture (routine x 2)     Status: None   Collection Time: 01/14/23  2:13 PM   Specimen: BLOOD  Result Value Ref Range Status   Specimen Description BLOOD BLOOD LEFT HAND  Final   Special Requests   Final    BOTTLES DRAWN AEROBIC AND ANAEROBIC Blood Culture results may not be optimal due to an inadequate volume of blood received in culture bottles   Culture   Final    NO GROWTH 5 DAYS Performed at [facility], [address] [place], [place] [address]    Report Status 01/19/2023 FINAL  Final  Blood culture (routine x 2)     Status: None   Collection Time: 01/14/23  2:13 PM   Specimen: BLOOD  Result Value Ref Range Status   Specimen Description BLOOD BLOOD LEFT ARM  Final   Special Requests   Final    BOTTLES DRAWN AEROBIC AND ANAEROBIC Blood Culture adequate volume   Culture   Final    NO GROWTH 5 DAYS Performed at [facility], [address] [place], [place] [address]    Report Status 01/19/2023 FINAL  Final    Labs: CBC: Recent Labs  Lab 01/14/23 1033 01/15/23 0437 01/19/23 0340 01/20/23 0426  WBC 18.3* 18.2* 19.2* 19.8*  HGB 10.2* 9.8* 10.3* 10.7*  HCT 30.8* 29.7* 30.5* 31.4*  MCV 87.7 88.9 85.2 85.6  PLT 372 377 591* 735*   Basic Metabolic Panel: Recent Labs  Lab 01/14/23 1033 01/15/23 0437 01/19/23 0340 01/20/23 0426  NA 132* 136 135 137  K 3.4* 3.4* 4.1 4.3  CL 94* 100 96* 97*  CO2 '28 27 30 '$ 32  GLUCOSE 105* 104* 85 89  BUN 7* 5* 12 10  CREATININE 0.49 0.37* 0.34* 0.37*  CALCIUM 8.5* 7.9* 8.4* 8.7*   Liver Function Tests: No results for input(s): "AST", "ALT", "ALKPHOS", "BILITOT", "PROT", "ALBUMIN" in the last 168 hours. CBG: No results for input(s): "GLUCAP" in the last 168  hours.  Discharge time spent: greater than 30 minutes.  Signed: Loletha Grayer, MD [facility] 01/20/2023

## 2023-01-20 NOTE — TOC Transition Note (Signed)
Transition of Care [facility]) - CM/SW Discharge Note   Patient Details  Name: Yvonne Pitts MRN: [id number] Date of Birth: September 10, 1956  [facility]) CM/SW Contact:  Beverly Sessions, RN Phone Number: 01/20/2023, 4:26 PM   Clinical Narrative:      Patient to discharge today.  Portable o2 and Rw has been delivered to room by [employer] [place] with [place] notified of discharge    Barriers to Discharge: Continued Medical Work up   Patient Goals and CMS Choice CMS Medicare.gov Compare Post Acute Care list provided to:: Patient Choice offered to / list presented to : Patient  Discharge Placement                         Discharge Plan and Services Additional resources added to the After Visit Summary for                  DME Arranged: [place] rolling DME Agency: [employer] Date DME Agency Contacted: 01/16/23   Representative spoke with at DME Agency: [place]: PT [place]: [place] Date [place]: 01/16/23   Representative spoke with at [place]: [place]  Social Determinants of Health ([place]) Interventions [name]: No Food Insecurity (01/15/2023)  Housing: Low Risk  (01/15/2023)  Transportation Needs: No Transportation Needs (01/15/2023)  Utilities: Not At Risk (01/15/2023)  Alcohol Screen: Low Risk  (01/02/2023)  Depression (PHQ2-9): High Risk (01/12/2023)  Tobacco Use: High Risk (01/20/2023)     Readmission Risk Interventions     No data to display

## 2023-01-20 NOTE — Assessment & Plan Note (Signed)
CT scan negative for pulmonary embolism and lower extremity ultrasound negative for DVT.

## 2023-01-29 ENCOUNTER — Telehealth: Payer: Self-pay | Admitting: Psychiatry

## 2023-01-29 NOTE — Telephone Encounter (Signed)
Received a lab result.  VPA 14 01/27/2023  Despite the level being in the subtherapeutic range, the plan is to continue with the same treatment for now, considering she is also on quetiapine.

## 2023-02-04 NOTE — Progress Notes (Unsigned)
BH MD/PA/NP OP Progress Note  02/05/2023 4:14 PM BERNA GITTO  MRN:  [id number]  Chief Complaint:  Chief Complaint  Patient presents with   Follow-up   HPI:  - she was admitted for multifocal pneumonia since the last visit. She was treated with Rocephin and Zithromax, followed by Augmentin with prednisone taper for pleuritic chest pain.   This is a follow-up appointment for paranoia, depression.  She states that she has been doing much better.  She is still on oxygen at home.  She is work PT and OT.  She has been feeling a lot better.  Although she was unable to watch TV due to difficulty in concentration, she has been able to watch TV and doing puzzles.  She sleeps good.  She has been "eating like a pig."  She is gaining back a little on her weight, and feels good about this.  She denies feeling depressed or anxiety.  She denies SI, HI, hallucinations.  She denies paranoia.   Juanda Crumble presents to the interview.  He thinks she has been doing much better.  He feels comfortable with the current medication at this time.    Wt Readings from Last 3 Encounters:  02/05/23 109 lb 14.4 oz (49.9 kg)  01/15/23 103 lb (46.7 kg)  01/12/23 106 lb 6.4 oz (48.3 kg)   01/12/23 106 lb 6.4 oz (48.3 kg)  01/02/23 97 lb 8 oz (44.2 kg)  01/01/23 121 lb 4.1 oz (55 kg)     Visit Diagnosis:    ICD-10-CM   1. Altered mental status, unspecified altered mental status type  R41.82     2. Cognitive and behavioral changes  R41.89    R46.89     3. Major depressive disorder in full remission, unspecified whether recurrent ([place])  F32.5       Past Psychiatric History: Please see initial evaluation for full details. I have reviewed the history. No updates at this time.     Past Medical History:  Past Medical History:  Diagnosis Date   Bipolar 1 disorder ([place])    COPD (chronic obstructive pulmonary disease) ([place]) 11/03/2016   DDD (degenerative disc disease), lumbar    Polycythemia    Ruptured disc,  cervical    Scoliosis     Past Surgical History:  Procedure Laterality Date   BREAST EXCISIONAL BIOPSY Right    years ago   DILATION AND CURETTAGE OF UTERUS     NECK SURGERY      Family Psychiatric History: Please see initial evaluation for full details. I have reviewed the history. No updates at this time.     Family History:  Family History  Problem Relation Age of Onset   Anxiety disorder Brother    Breast cancer Maternal Aunt 48   Colon cancer Maternal Aunt    Bone cancer Paternal Aunt     Social History:  Social History   Socioeconomic History   Marital status: Married    Spouse name: Not on file   Number of children: 2   Years of education: Not on file   Highest education level: Some college, no degree  Occupational History   Not on file  Tobacco Use   Smoking status: Every Day    Packs/day: 2.00    Years: 43.00    Total pack years: 86.00    Types: Cigarettes   Smokeless tobacco: Never  Vaping Use   Vaping Use: Never used  Substance and Sexual Activity   Alcohol use: Not  Currently    Comment: seldom maybe 2-3 times a year   Drug use: No   Sexual activity: Not Currently  Other Topics Concern   Not on file  Social History Narrative   Not on file   Social Determinants of Health   Financial Resource Strain: Not on file  Food Insecurity: No Food Insecurity (01/15/2023)   Hunger Vital Sign    Worried About Running Out of Food in the Last Year: Never true    Ran Out of Food in the Last Year: Never true  Transportation Needs: No Transportation Needs (01/15/2023)   PRAPARE - Hydrologist (Medical): No    Lack of Transportation (Non-Medical): No  Physical Activity: Not on file  Stress: Not on file  Social Connections: Not on file    Allergies:  Allergies  Allergen Reactions   Prochlorperazine Maleate     Other reaction(s): Unknown   Ibuprofen Rash    Metabolic Disorder Labs: Lab Results  Component Value Date    HGBA1C 5.4 08/09/2022   MPG 108.28 08/09/2022   No results found for: "PROLACTIN" Lab Results  Component Value Date   CHOL 182 08/09/2022   TRIG 106 08/09/2022   HDL 65 08/09/2022   CHOLHDL 2.8 08/09/2022   VLDL 21 08/09/2022   LDLCALC 96 08/09/2022   Lab Results  Component Value Date   TSH 3.714 07/25/2022   TSH 0.886 07/18/2022    Therapeutic Level Labs: No results found for: "LITHIUM" No results found for: "VALPROATE" No results found for: "CBMZ"  Current Medications: Current Outpatient Medications  Medication Sig Dispense Refill   Albuterol Sulfate (PROAIR RESPICLICK) 034 (90 Base) MCG/ACT AEPB Inhale 2 puffs into the lungs every 6 (six) hours as needed. 2 each 3   alendronate (FOSAMAX) 70 MG tablet Take 70 mg by mouth once a week. Take with a full glass of water on an empty stomach.     amLODipine (NORVASC) 5 MG tablet Take 1 tablet (5 mg total) by mouth daily. 30 tablet 3   budesonide-formoterol (SYMBICORT) 160-4.5 MCG/ACT inhaler Inhale 2 puffs into the lungs 2 (two) times daily.     citalopram (CELEXA) 20 MG tablet Take 1 tablet (20 mg total) by mouth daily. 30 tablet 3   cyanocobalamin 1000 MCG tablet Take 1 tablet (1,000 mcg total) by mouth daily. 30 tablet 3   divalproex (DEPAKOTE) 250 MG DR tablet Take 1 tablet (250 mg total) by mouth 2 (two) times daily at 8 am and 4 pm. 60 tablet 3   guaiFENesin-dextromethorphan (ROBITUSSIN DM) 100-10 MG/5ML syrup Take 5 mLs by mouth every 4 (four) hours as needed for cough. 118 mL 0   nicotine (NICODERM CQ - DOSED IN MG/24 HOURS) 21 mg/24hr patch One '21mg'$  patch chest wall daily (okay to substitute generic) 28 patch 0   omeprazole (PRILOSEC) 20 MG capsule Take 20 mg by mouth daily.     oxyCODONE (OXY IR/ROXICODONE) 5 MG immediate release tablet Take 0.5 tablets (2.5 mg total) by mouth every 6 (six) hours as needed for severe pain or moderate pain. 5 tablet 0   QUEtiapine Fumarate (SEROQUEL) 50 MG tablet Take 3 tablets (150 mg  total) by mouth at bedtime. 90 tablet 3   terbinafine (LAMISIL) 250 MG tablet Take 250 mg by mouth daily.     No current facility-administered medications for this visit.     Musculoskeletal: Strength & Muscle Tone: within normal limits Gait & Station: normal Patient leans: N/A  Psychiatric Specialty Exam: Review of Systems  Psychiatric/Behavioral: Negative.    All other systems reviewed and are negative.   Blood pressure (!) 101/59, pulse 69, temperature (!) 97.5 F (36.4 C), temperature source Skin, height '5\' 2"'$  (1.575 m), weight 109 lb 14.4 oz (49.9 kg).Body mass index is 20.1 kg/m.  General Appearance: Fairly Groomed  Eye Contact:  Good  Speech:  Clear and Coherent  Volume:  Normal  Mood:   better  Affect:  Appropriate, Congruent, and calm  Thought Process:  Coherent  Orientation:  Full (Time, Place, and Person)  Thought Content: Logical   Suicidal Thoughts:  No  Homicidal Thoughts:  No  Memory:  Immediate;   Good  Judgement:  Good  Insight:  Good  Psychomotor Activity:  Normal  Concentration:  Concentration: Good and Attention Span: Good  Recall:  Good  Fund of Knowledge: Good  Language: Good  Akathisia:  No  Handed:  Right  AIMS (if indicated): not done  Assets:  Communication Skills Desire for Improvement  ADL's:  Intact  Cognition: WNL  Sleep:  Good   Screenings: AUDIT    Flowsheet Row Admission (Discharged) from 01/02/2023 in [place] Admission (Discharged) from 08/08/2022 in [place]  Alcohol Use Disorder Identification Test Final Score (AUDIT) 0 0      GAD-7    Flowsheet Row Office Visit from 02/05/2023 in [place] Office Visit from 01/12/2023 in [place] Office Visit from 12/16/2022 in [place] Office Visit from 10/23/2022 in [place] Office Visit from 08/28/2022 in [place]  Total GAD-7 Score '2 13 7 4 2      '$ PHQ2-9    [place] Office Visit from 02/05/2023 in [place] Office Visit from 01/12/2023 in [place] Office Visit from 12/16/2022 in [place] Office Visit from 10/23/2022 in [place] Office Visit from 08/28/2022 in [place]  PHQ-2 Total Score '2 4 2 '$ 0 0  PHQ-9 Total Score '6 15 7 '$ -- --      Flowsheet Row ED to Hosp-Admission (Discharged) from 01/14/2023 in [place] Office Visit from 01/12/2023 in [place] Admission (Discharged) from 01/02/2023 in [place] No Risk Error: Question 6 not populated No Risk        Assessment and Plan:  PAYTEN HOBIN is a 67 y.o. year old female with a history of depression, psychosis, COPD, ongoing tobacco use, DDD, polycythemia, who is originally referred for aftercare visit. - she was admitted to [facility] in 07/2022. Per chart review, "67 year old woman who has been displaying increasingly bizarre agitated behaviors with disorganized thoughts at home which has been occurring for several weeks." This is after care visit of hospitalization last week.  1. Altered mental status, unspecified altered mental status type 2. Cognitive and behavioral changes # Paranoia  R/o cognitive disorder Significant improvement in confusion, behavior changes, paranoia since the last visit. Although she was diagnosed with bipolar disorder during the recent admission, she has no known history of mood disorder except for depression until age 46, which is a highly unusual presentation.Will continue Depakote for mood  dysregulation.  They are advised to maintain the current dose despite it  being subtherapeutic, as there have been no concerning symptoms displayed.  Will continue quetiapine for paranoia.   # r.o neurocognitive disorder She had cognitive deficits and word-finding difficulty on previous visit, although it was not noticeable on today's evaluation.  Differential includes Lewy body dementia given fluctuation in her paranoia, although she does not have formed hallucinations/Parkinson syndromes, and  frontotemporal dementia. She has an upcoming appointment at [facility].   Labs including Folate, vitamin B 12 are within normal range.  She has an upcoming appointment at memory clinic.  Will defer further evaluation at this time.    3. Major depressive disorder in full remission, unspecified whether recurrent ([place]) Improving.  She denies any significant mood symptoms since the last visit.  Will continue current dose of citalopram to target depression and anxiety given her mood has been reportedly stable for many years.      Plan Continue citalopram 20 mg daily Continue Depakote 250 mg twice a day Continue quetiapine 150 mg at night  (EKG 432 msec, HR 77 12/2022) Next appointment: 4/9 at 10:30 for 30 mins, in person   Ref Range & Units 10 d ago Comments  Valproic Acid Lvl - [facility] 50 - 100 ug/mL 14 Low       The patient demonstrates the following risk factors for suicide: Chronic risk factors for suicide include: psychiatric disorder of depression . Acute risk factors for suicide include: N/A. Protective factors for this patient include: positive social support, responsibility to others (children, family), coping skills, and hope for the future. Considering these factors, the overall suicide risk at this point appears to be low. Patient is appropriate for outpatient follow up.       Collaboration of Care: Collaboration of Care: Other reviewed notes in Epic  Patient/Guardian was advised Release  of Information must be obtained prior to any record release in order to collaborate their care with an outside provider. Patient/Guardian was advised if they have not already done so to contact the registration department to sign all necessary forms in order for [place] to release information regarding their care.   Consent: Patient/Guardian gives verbal consent for treatment and assignment of benefits for services provided during this visit. Patient/Guardian expressed understanding and agreed to proceed.    Norman Clay, MD 02/05/2023, 4:14 PM

## 2023-02-05 ENCOUNTER — Encounter: Payer: Self-pay | Admitting: Psychiatry

## 2023-02-05 ENCOUNTER — Ambulatory Visit: Payer: Medicare HMO | Admitting: Psychiatry

## 2023-02-05 VITALS — BP 101/59 | HR 69 | Temp 97.5°F | Ht 62.0 in | Wt 109.9 lb

## 2023-02-05 DIAGNOSIS — R4182 Altered mental status, unspecified: Secondary | ICD-10-CM

## 2023-02-05 DIAGNOSIS — R4689 Other symptoms and signs involving appearance and behavior: Secondary | ICD-10-CM

## 2023-02-05 DIAGNOSIS — R4189 Other symptoms and signs involving cognitive functions and awareness: Secondary | ICD-10-CM | POA: Diagnosis not present

## 2023-02-05 DIAGNOSIS — F325 Major depressive disorder, single episode, in full remission: Secondary | ICD-10-CM | POA: Diagnosis not present

## 2023-02-05 NOTE — Patient Instructions (Signed)
Continue citalopram 20 mg daily Continue Depakote 250 mg twice a day Continue quetiapine 150 mg at night   Next appointment: 4/9 at 10:30

## 2023-02-12 ENCOUNTER — Ambulatory Visit: Payer: Medicare HMO | Admitting: Psychiatry

## 2023-02-15 ENCOUNTER — Other Ambulatory Visit: Payer: Self-pay | Admitting: Psychiatry

## 2023-04-02 NOTE — Progress Notes (Unsigned)
Virtual Visit via Video Note  I connected with Yvonne Pitts on 04/07/23 at 10:30 AM EDT by a video enabled telemedicine application and verified that I am speaking with the correct person using two identifiers.  Location: Patient: home Provider: office Persons participated in the visit- patient, provider    I discussed the limitations of evaluation and management by telemedicine and the availability of in person appointments. The patient expressed understanding and agreed to proceed.    I discussed the assessment and treatment plan with the patient. The patient was provided an opportunity to ask questions and all were answered. The patient agreed with the plan and demonstrated an understanding of the instructions.   The patient was advised to call back or seek an in-person evaluation if the symptoms worsen or if the condition fails to improve as anticipated.  I provided 20 minutes of non-face-to-face time during this encounter.   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  This is a follow-up appointment for paranoia, altered mental status and depression.  She states that she has been doing okay.  She was seen at [facility], and underwent the test for Parkinson.  She is unsure how to interpret the result.  She was advised to be followed by Dr. Sherryll Burger.  She agrees to contact the office.  She tends to hang around in the house except that she was able to visit her brother.  She graduated from PT/OT, and has been able to do things in the house.  She sleeps up to 8 to 10 hours and feels refreshed.  She has good appetite, and feels good about weight gain rather than having weight loss as in the past.  She denies  feeling depressed.  She denies SI.  She denies hallucinations or paranoia.  She denies decreased need for sleep or euphonia.  She denies any memory loss.  Although she could not concentrate when she tried to take a shower in the past, she is not doing it anymore. She just wants to know the result of the skin biopsy. She denies alcohol use or drug use.   Yvonne Pitts denies any concern at this time.   Wt Readings from Last 3 Encounters:  02/05/23 109 lb 14.4 oz (49.9 kg)  01/15/23 103 lb (46.7 kg)  01/12/23 106 lb 6.4 oz (48.3 kg)     Visit Diagnosis:    ICD-10-CM   1. Altered mental status, unspecified altered mental status type  R41.82 Basic metabolic panel    2. Cognitive and behavioral changes  R41.89    R46.89     3. Major depressive disorder in full remission, unspecified whether recurrent  F32.5       Past Psychiatric History: Please see initial evaluation for full details. I have reviewed the history. No updates at this time.     Past Medical History:  Past Medical History:  Diagnosis Date   Bipolar 1 disorder (HCC)    COPD (chronic obstructive pulmonary disease) (HCC) 11/03/2016   DDD (degenerative disc disease), lumbar    Polycythemia    Ruptured disc, cervical    Scoliosis     Past Surgical History:  Procedure Laterality Date  BREAST EXCISIONAL BIOPSY Right    years ago   DILATION AND CURETTAGE OF UTERUS     NECK SURGERY      Family Psychiatric History: Please see initial evaluation for full details. I have reviewed the history. No updates at this time.     Family History:  Family History  Problem Relation Age of Onset   Anxiety disorder Brother    Breast cancer Maternal Aunt 40   Colon cancer Maternal Aunt    Bone cancer Paternal Aunt     Social History:  Social History   Socioeconomic History   Marital status: Married    Spouse name: Not on file   Number of children: 2   Years of education: Not on file   Highest education level: Some college, no  degree  Occupational History   Not on file  Tobacco Use   Smoking status: Every Day    Packs/day: 2.00    Years: 43.00    Additional pack years: 0.00    Total pack years: 86.00    Types: Cigarettes   Smokeless tobacco: Never  Vaping Use   Vaping Use: Never used  Substance and Sexual Activity   Alcohol use: Not Currently    Comment: seldom maybe 2-3 times a year   Drug use: No   Sexual activity: Not Currently  Other Topics Concern   Not on file  Social History Narrative   Not on file   Social Determinants of Health   Financial Resource Strain: Not on file  Food Insecurity: No Food Insecurity (01/15/2023)   Hunger Vital Sign    Worried About Running Out of Food in the Last Year: Never true    Ran Out of Food in the Last Year: Never true  Transportation Needs: No Transportation Needs (01/15/2023)   PRAPARE - Administrator, Civil Service (Medical): No    Lack of Transportation (Non-Medical): No  Physical Activity: Not on file  Stress: Not on file  Social Connections: Not on file    Allergies:  Allergies  Allergen Reactions   Prochlorperazine Maleate     Other reaction(s): Unknown   Ibuprofen Rash    Metabolic Disorder Labs: Lab Results  Component Value Date   HGBA1C 5.4 08/09/2022   MPG 108.28 08/09/2022   No results found for: "PROLACTIN" Lab Results  Component Value Date   CHOL 182 08/09/2022   TRIG 106 08/09/2022   HDL 65 08/09/2022   CHOLHDL 2.8 08/09/2022   VLDL 21 08/09/2022   LDLCALC 96 08/09/2022   Lab Results  Component Value Date   TSH 3.714 07/25/2022   TSH 0.886 07/18/2022    Therapeutic Level Labs: No results found for: "LITHIUM" No results found for: "VALPROATE" No results found for: "CBMZ"  Current Medications: Current Outpatient Medications  Medication Sig Dispense Refill   tizanidine (ZANAFLEX) 2 MG capsule Take 2 mg by mouth 3 (three) times daily.     Albuterol Sulfate (PROAIR RESPICLICK) 108 (90 Base) MCG/ACT AEPB  Inhale 2 puffs into the lungs every 6 (six) hours as needed. 2 each 3   alendronate (FOSAMAX) 70 MG tablet Take 70 mg by mouth once a week. Take with a full glass of water on an empty stomach.     amLODipine (NORVASC) 5 MG tablet Take 1 tablet (5 mg total) by mouth daily. 30 tablet 3   budesonide-formoterol (SYMBICORT) 160-4.5 MCG/ACT inhaler Inhale 2 puffs into the lungs 2 (two) times daily.     [START  ON 05/09/2023] citalopram (CELEXA) 20 MG tablet Take 1 tablet (20 mg total) by mouth daily. 30 tablet 3   cyanocobalamin 1000 MCG tablet Take 1 tablet (1,000 mcg total) by mouth daily. 30 tablet 3   [START ON 05/09/2023] divalproex (DEPAKOTE) 250 MG DR tablet Take 1 tablet (250 mg total) by mouth 2 (two) times daily at 8 am and 4 pm. 60 tablet 0   guaiFENesin-dextromethorphan (ROBITUSSIN DM) 100-10 MG/5ML syrup Take 5 mLs by mouth every 4 (four) hours as needed for cough. 118 mL 0   nicotine (NICODERM CQ - DOSED IN MG/24 HOURS) 21 mg/24hr patch One 21mg  patch chest wall daily (okay to substitute generic) 28 patch 0   omeprazole (PRILOSEC) 20 MG capsule Take 20 mg by mouth daily.     [START ON 05/09/2023] QUEtiapine (SEROQUEL) 50 MG tablet Take 3 tablets (150 mg total) by mouth at bedtime. 90 tablet 0   terbinafine (LAMISIL) 250 MG tablet Take 250 mg by mouth daily.     No current facility-administered medications for this visit.     Musculoskeletal: Strength & Muscle Tone:  N/A Gait & Station:  N/A Patient leans: N/A  Psychiatric Specialty Exam: Review of Systems  Psychiatric/Behavioral: Negative.    All other systems reviewed and are negative.   There were no vitals taken for this visit.There is no height or weight on file to calculate BMI.  General Appearance: Fairly Groomed  Eye Contact:  Good  Speech:  Clear and Coherent  Volume:  Normal  Mood:   good  Affect:  Appropriate, Congruent, and calm  Thought Process:  Coherent  Orientation:  Full (Time, Place, and Person)  Thought  Content: Logical   Suicidal Thoughts:  No  Homicidal Thoughts:  No  Memory:  Immediate;   Good  Judgement:  Good  Insight:  Good  Psychomotor Activity:  Normal  Concentration:  Concentration: Good and Attention Span: Good  Recall:  Good  Fund of Knowledge: Good  Language: Good  Akathisia:  No  Handed:  Right  AIMS (if indicated): not done  Assets:  Communication Skills Desire for Improvement  ADL's:  Intact  Cognition: WNL  Sleep:  Good   Screenings: AUDIT    Flowsheet Row Admission (Discharged) from 01/02/2023 in [facility] Admission (Discharged) from 08/08/2022 in [facility]  Alcohol Use Disorder Identification Test Final Score (AUDIT) 0 0      GAD-7    Flowsheet Row Office Visit from 02/05/2023 in [facility] Office Visit from 01/12/2023 in [facility] Office Visit from 12/16/2022 in [facility] Office Visit from 10/23/2022 in [facility] Office Visit from 08/28/2022 in [facility]  Total GAD-7 Score 2 13 7 4 2       PHQ2-9    Flowsheet Row Office Visit from 02/05/2023 in [facility] Office Visit from 01/12/2023 in [facility] Office Visit from 12/16/2022 in [facility] Office Visit from 10/23/2022 in [facility] Office Visit from 08/28/2022 in [facility]  PHQ-2 Total Score 2 4 2  0 0  PHQ-9 Total Score 6 15 7  -- --      Flowsheet Row ED to Hosp-Admission (Discharged) from 01/14/2023 in [facility] Office Visit from 01/12/2023 in [facility] Admission  (Discharged) from 01/02/2023 in [facility]  C-SSRS RISK CATEGORY No Risk Error: Question 6 not populated No Risk        Assessment and Plan:  DELOIS SILVESTER is a 67 y.o. year old female with a history of depression, psychosis, COPD, ongoing tobacco use, DDD, polycythemia, who is originally referred for aftercare visit. - she was admitted to [facility] in 07/2022. Per chart review, "67 year old woman who has been displaying increasingly bizarre agitated behaviors with disorganized thoughts at home which has been occurring for several weeks."   1. Altered mental status, unspecified altered mental status type 2. Cognitive and behavioral changes # Paranoia  R/o cognitive disorder Functional Status   IADL: Independent in the following: managing finances, medications, driving           Requires assistance with the following:  ADL  Independent in the following: bathing and hygiene, feeding, continence, grooming and toileting, walking          Requires assistance with the following: Folate, Vtamin B12, TSH wnl 1-01/2023 Images  Head CT 12/1022 Brain: No acute hemorrhage, mass effect or midline shift. Gray-white differentiation is preserved. No hydrocephalus. No extra-axial collection. Basilar cisterns are patent. Unchanged mild cerebellar tonsillar ectopia. Neuropsych assessment: seen at [facility] clinic- skin biopsy, LP was done  Etiology:   History- admitted in August 2023 for behavioral issues. Had similar presentation in 12/2022, IVCd. She was reportedly talking fast and tangential, and was complaining insomnia. She was admitted under the diagnosis of bipolar disorder There has been steady improvement in confusion, behavioral changes and paranoia since the last visit. Although she was diagnosed with bipolar disorder during the recent admissions, she has no known history of mood disorder except for depression until age 75, which is a highly unusual presentation.  She underwent skin biopsy  at [facility].  The result was not available in Care Everywhere.  She was advised to contact her neurologist office. Dr. Sherryll Burger to follow-up on this.  Will continue Depakote for mood dysregulation at this time, although it can be tapered off in the future depending on the etiology of her recent behavior issues.  Will continue quetiapine for paranoia at this time. Will obtain lab to monitor hyponatremia.   3. Major depressive disorder in full remission, unspecified whether recurrent Improving.  She denies any significant mood symptoms since the last visit.  Will continue current dose of citalopram to target depression given her mood has been reportedly stable for many years prior to the recent admission.    Plan Continue citalopram 20 mg daily Continue Depakote 250 mg twice a day, VPA 14 12/2022, LFT wnl 01/2023, Plt wnll 01/2023 Continue quetiapine 150 mg at night  (EKG 432 msec, HR 77 12/2022) Obtain BMP Next appointment: 6/4 at 3 30 for 30 mins, in person    The patient demonstrates the following risk factors for suicide: Chronic risk factors for suicide include: psychiatric disorder of depression . Acute risk factors for suicide include: N/A. Protective factors for this patient include: positive social support, responsibility to others (children, family), coping skills, and hope for the future. Considering these factors, the overall suicide risk at this point appears to be low. Patient is appropriate for outpatient follow up.     Collaboration of Care: Collaboration of Care: Other reviewed notes in Epic  Patient/Guardian was advised Release of Information must be obtained prior to any record release in order to collaborate their care with an outside provider. Patient/Guardian  was advised if they have not already done so to contact the registration department to sign all necessary forms in order for [place] to release information regarding their care.   Consent: Patient/Guardian gives verbal consent for  treatment and assignment of benefits for services provided during this visit. Patient/Guardian expressed understanding and agreed to proceed.    Yvonne Hotter, MD 04/07/2023, 12:12 PM

## 2023-04-07 ENCOUNTER — Encounter: Payer: Self-pay | Admitting: Psychiatry

## 2023-04-07 ENCOUNTER — Telehealth (INDEPENDENT_AMBULATORY_CARE_PROVIDER_SITE_OTHER): Payer: Medicare HMO | Admitting: Psychiatry

## 2023-04-07 DIAGNOSIS — R4689 Other symptoms and signs involving appearance and behavior: Secondary | ICD-10-CM | POA: Diagnosis not present

## 2023-04-07 DIAGNOSIS — F325 Major depressive disorder, single episode, in full remission: Secondary | ICD-10-CM

## 2023-04-07 DIAGNOSIS — R4189 Other symptoms and signs involving cognitive functions and awareness: Secondary | ICD-10-CM | POA: Diagnosis not present

## 2023-04-07 DIAGNOSIS — R4182 Altered mental status, unspecified: Secondary | ICD-10-CM

## 2023-04-07 MED ORDER — DIVALPROEX SODIUM 250 MG PO DR TAB: 250.0000 mg | DELAYED_RELEASE_TABLET | ORAL | 0 refills | Status: DC

## 2023-04-07 MED ORDER — QUETIAPINE FUMARATE 50 MG PO TABS: 150.0000 mg | ORAL_TABLET | Freq: Every day | ORAL | 0 refills | Status: DC

## 2023-04-07 MED ORDER — CITALOPRAM HYDROBROMIDE 20 MG PO TABS: 20.0000 mg | ORAL_TABLET | Freq: Every day | ORAL | 3 refills | Status: DC

## 2023-04-07 NOTE — Patient Instructions (Signed)
Continue citalopram 20 mg daily Continue Depakote 250 mg twice a day,  Continue quetiapine 150 mg at night  Obtain BMP Next appointment: 6/4 at 3 30

## 2023-04-09 ENCOUNTER — Ambulatory Visit
Admission: RE | Admit: 2023-04-09 | Discharge: 2023-04-09 | Disposition: A | Discharge status: Home / Self Care | Payer: Medicare HMO | Source: Ambulatory Visit | Attending: Internal Medicine | Admitting: Internal Medicine

## 2023-04-09 DIAGNOSIS — Z87891 Personal history of nicotine dependence: Secondary | ICD-10-CM | POA: Insufficient documentation

## 2023-04-09 DIAGNOSIS — Z122 Encounter for screening for malignant neoplasm of respiratory organs: Secondary | ICD-10-CM | POA: Diagnosis present

## 2023-04-09 DIAGNOSIS — F17210 Nicotine dependence, cigarettes, uncomplicated: Secondary | ICD-10-CM | POA: Diagnosis present

## 2023-04-10 ENCOUNTER — Other Ambulatory Visit: Payer: Self-pay | Admitting: Psychiatry

## 2023-04-10 ENCOUNTER — Other Ambulatory Visit: Payer: Self-pay | Admitting: Acute Care

## 2023-04-10 DIAGNOSIS — Z122 Encounter for screening for malignant neoplasm of respiratory organs: Secondary | ICD-10-CM

## 2023-04-10 DIAGNOSIS — R4182 Altered mental status, unspecified: Secondary | ICD-10-CM

## 2023-04-10 DIAGNOSIS — Z87891 Personal history of nicotine dependence: Secondary | ICD-10-CM

## 2023-04-10 LAB — VALPROIC ACID LEVEL: Valproic Acid Lvl: 12 ug/mL — ABNORMAL LOW (ref 50–100)

## 2023-04-10 NOTE — Progress Notes (Signed)
Although the plan was to get VPA and BMP, only VPA was taken. I sent another order to [facility]. Could you ask them if they can add this order? Thanks.

## 2023-04-11 LAB — BASIC METABOLIC PANEL
BUN/Creatinine Ratio: 32 — ABNORMAL HIGH (ref 12–28)
BUN: 12 mg/dL (ref 8–27)
CO2: 20 mmol/L (ref 20–29)
Calcium: 9.5 mg/dL (ref 8.7–10.3)
Chloride: 101 mmol/L (ref 96–106)
Creatinine, Ser: 0.38 mg/dL — ABNORMAL LOW (ref 0.57–1.00)
Glucose: 100 mg/dL — ABNORMAL HIGH (ref 70–99)
Potassium: 4.6 mmol/L (ref 3.5–5.2)
Sodium: 140 mmol/L (ref 134–144)
eGFR: 110 mL/min/{1.73_m2} (ref >59)

## 2023-04-11 NOTE — Progress Notes (Signed)
Please inform the patient that the lab results are within the acceptable range.

## 2023-04-13 NOTE — Progress Notes (Signed)
Called to inform patient of lab results she voiced understanding.

## 2023-04-14 ENCOUNTER — Telehealth: Payer: Self-pay

## 2023-04-14 NOTE — Telephone Encounter (Signed)
pt was notified of labwork results.

## 2023-05-05 ENCOUNTER — Other Ambulatory Visit: Payer: Self-pay | Admitting: Psychiatry

## 2023-05-08 ENCOUNTER — Other Ambulatory Visit: Payer: Self-pay | Admitting: Psychiatry

## 2023-05-25 NOTE — Progress Notes (Addendum)
BH MD/PA/NP OP Progress Note  06/02/2023 4:14 PM Yvonne Pitts  MRN:  [id number]  Chief Complaint:  Chief Complaint  Patient presents with   Follow-up   HPI:  - according to the chart review, "skin biopsy was essentially normal. There is slight decrease in nerve fiber density in her distal leg"   This is a follow-up appointment for depression, and a lot of mental status.  She states that she does not feel like doing things.  Although she goes outside and reads something, watches TV, those do not matter anymore.  She started smoking again after 4 months of smoking cessation.  She sleeps 8 hours, and feels tired in the morning.  She feels fine most of the time.  She has good appetite, and feels comfortable with the current weight.  She denies SI.  She denies decreased need for sleep or euphonia.  She denies hallucinations or paranoia.  She denies feeling confused.  She is willing to take lower dose of Depakote at this time.   Her husband presents to the visit.  He thinks she has been a lot better compared to before.  He thinks that she is more normal.  He wonders what happens.  He was told at [facility] clinic that they would consider Alzheimer or a parkinson. He believes those were negative based on the test.  He denies any concerns about behavior issues.    Wt Readings from Last 3 Encounters:  06/02/23 133 lb (60.3 kg)  02/05/23 109 lb 14.4 oz (49.9 kg)  01/15/23 103 lb (46.7 kg)      Visit Diagnosis:    ICD-10-CM   1. Altered mental status, unspecified altered mental status type  R41.82     2. Major depressive disorder in full remission, unspecified whether recurrent (HCC)  F32.5     3. Anemia, unspecified type  D64.9 CBC    Iron, TIBC and Ferritin Panel      Past Psychiatric History: Please see initial evaluation for full details. I have reviewed the history. No updates at this time.     Past Medical History:  Past Medical History:  Diagnosis Date   Bipolar 1 disorder  (HCC)    COPD (chronic obstructive pulmonary disease) (HCC) 11/03/2016   DDD (degenerative disc disease), lumbar    Polycythemia    Ruptured disc, cervical    Scoliosis     Past Surgical History:  Procedure Laterality Date   BREAST EXCISIONAL BIOPSY Right    years ago   DILATION AND CURETTAGE OF UTERUS     NECK SURGERY      Family Psychiatric History: Please see initial evaluation for full details. I have reviewed the history. No updates at this time.     Family History:  Family History  Problem Relation Age of Onset   Anxiety disorder Brother    Breast cancer Maternal Aunt 40   Colon cancer Maternal Aunt    Bone cancer Paternal Aunt     Social History:  Social History   Socioeconomic History   Marital status: Married    Spouse name: Not on file   Number of children: 2   Years of education: Not on file   Highest education level: Some college, no degree  Occupational History   Not on file  Tobacco Use   Smoking status: Every Day    Packs/day: 2.00    Years: 43.00    Additional pack years: 0.00    Total pack years: 86.00  Types: Cigarettes   Smokeless tobacco: Never  Vaping Use   Vaping Use: Never used  Substance and Sexual Activity   Alcohol use: Not Currently    Comment: seldom maybe 2-3 times a year   Drug use: No   Sexual activity: Not Currently  Other Topics Concern   Not on file  Social History Narrative   Not on file   Social Determinants of Health   Financial Resource Strain: Not on file  Food Insecurity: No Food Insecurity (01/15/2023)   Hunger Vital Sign    Worried About Running Out of Food in the Last Year: Never true    Ran Out of Food in the Last Year: Never true  Transportation Needs: No Transportation Needs (01/15/2023)   PRAPARE - Administrator, Civil Service (Medical): No    Lack of Transportation (Non-Medical): No  Physical Activity: Not on file  Stress: Not on file  Social Connections: Not on file    Allergies:   Allergies  Allergen Reactions   Prochlorperazine Maleate     Other reaction(s): Unknown   Ibuprofen Rash    Metabolic Disorder Labs: Lab Results  Component Value Date   HGBA1C 5.4 08/09/2022   MPG 108.28 08/09/2022   No results found for: "PROLACTIN" Lab Results  Component Value Date   CHOL 182 08/09/2022   TRIG 106 08/09/2022   HDL 65 08/09/2022   CHOLHDL 2.8 08/09/2022   VLDL 21 08/09/2022   LDLCALC 96 08/09/2022   Lab Results  Component Value Date   TSH 3.714 07/25/2022   TSH 0.886 07/18/2022    Therapeutic Level Labs: No results found for: "LITHIUM" Lab Results  Component Value Date   VALPROATE 12 (L) 04/09/2023   No results found for: "CBMZ"  Current Medications: Current Outpatient Medications  Medication Sig Dispense Refill   Albuterol Sulfate (PROAIR RESPICLICK) 108 (90 Base) MCG/ACT AEPB Inhale 2 puffs into the lungs every 6 (six) hours as needed. 2 each 3   alendronate (FOSAMAX) 70 MG tablet Take 70 mg by mouth once a week. Take with a full glass of water on an empty stomach.     amLODipine (NORVASC) 5 MG tablet Take 1 tablet (5 mg total) by mouth daily. 30 tablet 3   budesonide-formoterol (SYMBICORT) 160-4.5 MCG/ACT inhaler Inhale 2 puffs into the lungs 2 (two) times daily.     citalopram (CELEXA) 20 MG tablet Take 1 tablet (20 mg total) by mouth daily. 30 tablet 3   cyanocobalamin 1000 MCG tablet Take 1 tablet (1,000 mcg total) by mouth daily. 30 tablet 3   divalproex (DEPAKOTE) 250 MG DR tablet Take 1 tablet (250 mg total) by mouth 2 (two) times daily at 8 am and 4 pm. 180 tablet 0   guaiFENesin-dextromethorphan (ROBITUSSIN DM) 100-10 MG/5ML syrup Take 5 mLs by mouth every 4 (four) hours as needed for cough. 118 mL 0   nicotine (NICODERM CQ - DOSED IN MG/24 HOURS) 21 mg/24hr patch One 21mg  patch chest wall daily (okay to substitute generic) 28 patch 0   omeprazole (PRILOSEC) 20 MG capsule Take 20 mg by mouth daily.     terbinafine (LAMISIL) 250 MG  tablet Take 250 mg by mouth daily.     tizanidine (ZANAFLEX) 2 MG capsule Take 2 mg by mouth 3 (three) times daily.     [START ON 06/08/2023] QUEtiapine (SEROQUEL) 50 MG tablet Take 3 tablets (150 mg total) by mouth at bedtime. 270 tablet 0   No current facility-administered medications  for this visit.     Musculoskeletal: Strength & Muscle Tone: within normal limits Gait & Station: normal Patient leans: N/A  Psychiatric Specialty Exam: Review of Systems  Psychiatric/Behavioral:  Positive for dysphoric mood. Negative for agitation, behavioral problems, confusion, decreased concentration, hallucinations, self-injury, sleep disturbance and suicidal ideas. The patient is not nervous/anxious and is not hyperactive.   All other systems reviewed and are negative.   Blood pressure 118/74, pulse 87, temperature 98.1 F (36.7 C), temperature source Skin, height 5\' 2"  (1.575 m), weight 133 lb (60.3 kg).Body mass index is 24.33 kg/m.  General Appearance: Fairly Groomed  Eye Contact:  Good  Speech:  Clear and Coherent  Volume:  Normal  Mood:   good  Affect:  Appropriate, Congruent, and fatigue  Thought Process:  Coherent  Orientation:  Full (Time, Place, and Person)  Thought Content: Logical   Suicidal Thoughts:  No  Homicidal Thoughts:  No  Memory:  Immediate;   Good  Judgement:  Good  Insight:  Good  Psychomotor Activity:  Normal  Concentration:  Concentration: Good and Attention Span: Good  Recall:  Good  Fund of Knowledge: Good  Language: Good  Akathisia:  No  Handed:  Right  AIMS (if indicated): not done  Assets:  Communication Skills Desire for Improvement  ADL's:  Intact  Cognition: WNL  Sleep:  Good   Screenings: AUDIT    Flowsheet Row Admission (Discharged) from 01/02/2023 in [facility] Admission (Discharged) from 08/08/2022 in [facility]  Alcohol Use Disorder Identification Test Final Score (AUDIT) 0 0      GAD-7     Flowsheet Row Office Visit from 06/02/2023 in [facility] Office Visit from 02/05/2023 in [facility] Office Visit from 01/12/2023 in [facility] Office Visit from 12/16/2022 in [facility] Office Visit from 10/23/2022 in [facility]  Total GAD-7 Score 5 2 13 7 4       PHQ2-9    Flowsheet Row Office Visit from 06/02/2023 in [facility] Office Visit from 02/05/2023 in [facility] Office Visit from 01/12/2023 in [facility] Office Visit from 12/16/2022 in [facility] Office Visit from 10/23/2022 in [facility]  PHQ-2 Total Score 2 2 4 2  0  PHQ-9 Total Score 7 6 15 7  --      Flowsheet Row ED to Hosp-Admission (Discharged) from 01/14/2023 in [facility] Office Visit from 01/12/2023 in [facility] Admission (Discharged) from 01/02/2023 in [facility]  C-SSRS RISK CATEGORY No Risk Error: Question 6 not populated No Risk        Assessment and Plan:  LILYAHNA RISINGER is a 67 y.o. year old female with a history of depression, psychosis, COPD, ongoing tobacco use, DDD, polycythemia, who is originally referred for aftercare visit. - she was admitted to [facility] in 07/2022. Per chart review, "67 year old woman who has been displaying increasingly bizarre agitated behaviors with disorganized thoughts at home which has been occurring for several weeks."    1. Altered mental status, unspecified altered mental status type 2. Cognitive and behavioral changes # Paranoia  R/o cognitive disorder IADL: Independent in the  following: managing finances, medications, driving  Requires assistance with the following:  ADL  Independent in the following: bathing and hygiene, feeding, continence, grooming and toileting, walking          Requires assistance with the following: Folate, Vtamin B12, TSH wnl 1-01/2023 Images  Head CT 12/1022 Brain: No acute hemorrhage, mass effect or midline shift. Gray-white differentiation is preserved. No hydrocephalus. No extra-axial collection. Basilar cisterns are patent. Unchanged mild cerebellar tonsillar ectopia. Neuropsych assessment: seen at [facility] clinic- skin biopsy, LP was done  Etiology:   History- admitted in August 2023 for behavioral issues. Had similar presentation in 12/2022, IVCd. She was reportedly talking fast and tangential, and was complaining insomnia. She was admitted under the diagnosis of bipolar disorder Overall improving since the last visit.  Both the patient and her husband denies any psychotic symptoms or behavior changes, and she demonstrates organized thought process.  Etiology of the recent episode is still unclear.  Although she was diagnosed with bipolar disorder during the recent admissions, she has no known history of mood disorder except for depression until age 12, which is a highly unusual presentation.  She was seen at [facility] clinic; will obtain record for collaterals.  Will do taper down Depakote to avoid polypharmacy and to mitigate its risk of drowsiness.  Will continue quetiapine at this time for possible behavioral issues.   2. Major depressive disorder in full remission, unspecified whether recurrent (HCC) 3. Anemia, unspecified type She reports slight worsening in anhedonia.  Will continue current dose of citalopram and quetiapine to target depression.  Will obtain labs for evaluation of microcytic anemia at the previous visit/given that it can contribute to fatigue.    Plan Continue citalopram 20 mg daily Decrease Depakote 250 mg at  night (was taking 250 mg BID), VPA 14 12/2022, BMP wnl 03/2023, LFT wnl 01/2023, Plt wnll 01/2023 Continue quetiapine 150 mg at night  (EKG 432 msec, HR 77 12/2022) Obtain labs- CBC, iron panels Next appointment: 8/6 at 2:30 for 30 mins, in person     The patient demonstrates the following risk factors for suicide: Chronic risk factors for suicide include: psychiatric disorder of depression . Acute risk factors for suicide include: N/A. Protective factors for this patient include: positive social support, responsibility to others (children, family), coping skills, and hope for the future. Considering these factors, the overall suicide risk at this point appears to be low. Patient is appropriate for outpatient follow up.     Collaboration of Care: Collaboration of Care: Other reviewed notes in Epic  Patient/Guardian was advised Release of Information must be obtained prior to any record release in order to collaborate their care with an outside provider. Patient/Guardian was advised if they have not already done so to contact the registration department to sign all necessary forms in order for [place] to release information regarding their care.   Consent: Patient/Guardian gives verbal consent for treatment and assignment of benefits for services provided during this visit. Patient/Guardian expressed understanding and agreed to proceed.    Neysa Hotter, MD 06/02/2023, 4:14 PM

## 2023-05-26 ENCOUNTER — Other Ambulatory Visit: Payer: Self-pay | Admitting: Internal Medicine

## 2023-05-26 ENCOUNTER — Encounter: Payer: Self-pay | Admitting: Internal Medicine

## 2023-05-26 DIAGNOSIS — Z1231 Encounter for screening mammogram for malignant neoplasm of breast: Secondary | ICD-10-CM

## 2023-05-28 ENCOUNTER — Ambulatory Visit
Admission: RE | Admit: 2023-05-28 | Discharge: 2023-05-28 | Disposition: A | Discharge status: Home / Self Care | Payer: Medicare HMO | Source: Ambulatory Visit | Attending: Internal Medicine | Admitting: Internal Medicine

## 2023-05-28 DIAGNOSIS — Z1231 Encounter for screening mammogram for malignant neoplasm of breast: Secondary | ICD-10-CM | POA: Insufficient documentation

## 2023-06-02 ENCOUNTER — Ambulatory Visit: Payer: Medicare HMO | Admitting: Psychiatry

## 2023-06-02 ENCOUNTER — Encounter: Payer: Self-pay | Admitting: Psychiatry

## 2023-06-02 VITALS — BP 118/74 | HR 87 | Temp 98.1°F | Ht 62.0 in | Wt 133.0 lb

## 2023-06-02 DIAGNOSIS — D649 Anemia, unspecified: Secondary | ICD-10-CM | POA: Diagnosis not present

## 2023-06-02 DIAGNOSIS — F325 Major depressive disorder, single episode, in full remission: Secondary | ICD-10-CM

## 2023-06-02 DIAGNOSIS — R4182 Altered mental status, unspecified: Secondary | ICD-10-CM

## 2023-06-02 MED ORDER — QUETIAPINE FUMARATE 50 MG PO TABS: 150.0000 mg | ORAL_TABLET | Freq: Every day | ORAL | 0 refills | Status: DC

## 2023-06-03 LAB — IRON,TIBC AND FERRITIN PANEL
Ferritin: 9 ng/mL — ABNORMAL LOW (ref 15–150)
Iron Saturation: 16 % (ref 15–55)
Iron: 64 ug/dL (ref 27–139)
Total Iron Binding Capacity: 389 ug/dL (ref 250–450)
UIBC: 325 ug/dL (ref 118–369)

## 2023-06-03 LAB — CBC
Hematocrit: 42.6 % (ref 34.0–46.6)
Hemoglobin: 14.3 g/dL (ref 11.1–15.9)
MCH: 30.3 pg (ref 26.6–33.0)
MCHC: 33.6 g/dL (ref 31.5–35.7)
MCV: 90 fL (ref 79–97)
Platelets: 461 10*3/uL — ABNORMAL HIGH (ref 150–450)
RBC: 4.72 x10E6/uL (ref 3.77–5.28)
RDW: 12.5 % (ref 11.7–15.4)
WBC: 8.7 10*3/uL (ref 3.4–10.8)

## 2023-06-03 NOTE — Progress Notes (Signed)
Could you reach out to the patient? Her ferritin is within the lower range. I  recommend taking an iron tablet, such as Ferrous sulfate 325 mg, daily to see if it helps for fatigue. Will plan to recheck lab  in a few months. Although her platelet count is slightly higher than the normal range, I do not think any intervention is needed at this time unless there is any worsening. Other labs are within the normal range.

## 2023-06-04 NOTE — Progress Notes (Signed)
Called patient to make aware of the lab results she voiced understanding

## 2023-06-09 ENCOUNTER — Telehealth: Payer: Self-pay

## 2023-06-09 NOTE — Telephone Encounter (Signed)
pt stated that she started the iron but she can not tell any thing differently. she has started taking the ferritin sulfate

## 2023-06-09 NOTE — Telephone Encounter (Signed)
-----   Message from Neysa Hotter, MD sent at 06/03/2023  2:22 PM EDT ----- Could you reach out to the patient? Her ferritin is within the lower range. I  recommend taking an iron tablet, such as Ferrous sulfate 325 mg, daily to see if it helps for fatigue. Will plan to recheck lab  in a few months. Although her platelet cou nt is slightly higher than the normal range, I do not think any intervention is needed at this time unless there is any worsening. Other labs are within the normal range.

## 2023-08-04 ENCOUNTER — Ambulatory Visit: Payer: Medicare HMO | Admitting: Psychiatry

## 2023-08-05 ENCOUNTER — Other Ambulatory Visit: Payer: Self-pay | Admitting: Psychiatry

## 2023-08-05 NOTE — Telephone Encounter (Signed)
Could you ask her if she needs this refill? She has been taking just one tab per day, so I believe she has enough for now.

## 2023-08-05 NOTE — Telephone Encounter (Signed)
Spoke to patient she stated that she does not need a refill for the Divalproex right now

## 2023-08-05 NOTE — Telephone Encounter (Signed)
Called patient no answer left voicemail for patient to return call to office 

## 2023-08-29 NOTE — Progress Notes (Unsigned)
BH MD/PA/NP OP Progress Note  09/03/2023 5:37 PM Yvonne Pitts  MRN:  [id number]  Chief Complaint:  Chief Complaint  Patient presents with   Follow-up   HPI:  This is a follow-up appointment for depression, r/o bipolar disorder, and behavioral changes.  She states that she will be moving to [facility] next week.  They have been talking about this plan for a while.  Her daughter is there.  Yvonne Pitts will be helpful to have her support, as they have a son with autism.  She feels stressed about this.  She states that she has started to think about things which happened a long time ago.  This makes her feel uncomfortable at the time.  Although she used to talk on the phone, she does not do this anymore as she has nothing to talk about.  She feels "blah."  She has no energy to do things.  Although it has been going on for the past few months, she thinks it has gotten worse after lowering the dose of Depakote.  She sleeps several hours and feels fatigued.  She denies SI, HI.  She denies hallucinations or paranoia. She has good appetite, and feels relatively comfortable with her current weight.   Yvonne Pitts, her husband presents to the visit.  Yvonne Pitts states that she is not getting better, although Yvonne Pitts does not think she is worse.  She does not make a phone call anymore.  She has been more quiet.  Yvonne Pitts denies any behavior concern at this time.   Baseline 125 lbs Wt Readings from Last 3 Encounters:  09/03/23 139 lb (63 kg)  06/02/23 133 lb (60.3 kg)  02/05/23 109 lb 14.4 oz (49.9 kg)    Visit Diagnosis:    ICD-10-CM   1. Altered mental status, unspecified altered mental status type  R41.82     2. High risk medication use  Z79.899 CBC    Hepatic function panel    Valproic acid level    3. Cognitive and behavioral changes  R41.89    R46.89     4. Current mild episode of major depressive disorder, unspecified whether recurrent (HCC)  F32.0       Past Psychiatric History: Please see initial evaluation for full  details. I have reviewed the history. No updates at this time.     Past Medical History:  Past Medical History:  Diagnosis Date   Bipolar 1 disorder (HCC)    COPD (chronic obstructive pulmonary disease) (HCC) 11/03/2016   DDD (degenerative disc disease), lumbar    Polycythemia    Ruptured disc, cervical    Scoliosis     Past Surgical History:  Procedure Laterality Date   BREAST EXCISIONAL BIOPSY Right    years ago   DILATION AND CURETTAGE OF UTERUS     NECK SURGERY      Family Psychiatric History: Please see initial evaluation for full details. I have reviewed the history. No updates at this time.     Family History:  Family History  Problem Relation Age of Onset   Anxiety disorder Brother    Breast cancer Maternal Aunt 40   Colon cancer Maternal Aunt    Bone cancer Paternal Aunt     Social History:  Social History   Socioeconomic History   Marital status: Married    Spouse name: Not on file   Number of children: 2   Years of education: Not on file   Highest education level: Some college, no degree  Occupational  History   Not on file  Tobacco Use   Smoking status: Every Day    Current packs/day: 2.00    Average packs/day: 2.0 packs/day for 43.0 years (86.0 ttl pk-yrs)    Types: Cigarettes   Smokeless tobacco: Never  Vaping Use   Vaping status: Never Used  Substance and Sexual Activity   Alcohol use: Not Currently    Comment: seldom maybe 2-3 times a year   Drug use: No   Sexual activity: Not Currently  Other Topics Concern   Not on file  Social History Narrative   Not on file   Social Determinants of Health   Financial Resource Strain: Low Risk  (08/25/2023)   Received from [facility]   Overall Financial Resource Strain (CARDIA)    Difficulty of Paying Living Expenses: Not hard at all  Food Insecurity: No Food Insecurity (08/25/2023)   [facility]   Hunger Vital Sign    Worried About Running Out of  Food in the Last Year: Never true    Ran Out of Food in the Last Year: Never true  Transportation Needs: No Transportation Needs (08/25/2023)   Received from [facility] - Transportation    In the past 12 months, has lack of transportation kept you from medical appointments or from getting medications?: No    Lack of Transportation (Non-Medical): No  Physical Activity: Not on file  Stress: Not on file  Social Connections: Not on file    Allergies:  Allergies  Allergen Reactions   Prochlorperazine Maleate     Other reaction(s): Unknown   Ibuprofen Rash    Metabolic Disorder Labs: Lab Results  Component Value Date   HGBA1C 5.4 08/09/2022   MPG 108.28 08/09/2022   No results found for: "PROLACTIN" Lab Results  Component Value Date   CHOL 182 08/09/2022   TRIG 106 08/09/2022   HDL 65 08/09/2022   CHOLHDL 2.8 08/09/2022   VLDL 21 08/09/2022   LDLCALC 96 08/09/2022   Lab Results  Component Value Date   TSH 3.714 07/25/2022   TSH 0.886 07/18/2022    Therapeutic Level Labs: No results found for: "LITHIUM" Lab Results  Component Value Date   VALPROATE 12 (L) 04/09/2023   No results found for: "CBMZ"  Current Medications: Current Outpatient Medications  Medication Sig Dispense Refill   Albuterol Sulfate (PROAIR RESPICLICK) 108 (90 Base) MCG/ACT AEPB Inhale 2 puffs into the lungs every 6 (six) hours as needed. 2 each 3   alendronate (FOSAMAX) 70 MG tablet Take 70 mg by mouth once a week. Take with a full glass of water on an empty stomach.     amLODipine (NORVASC) 5 MG tablet Take 1 tablet (5 mg total) by mouth daily. 30 tablet 3   budesonide-formoterol (SYMBICORT) 160-4.5 MCG/ACT inhaler Inhale 2 puffs into the lungs 2 (two) times daily.     cyanocobalamin 1000 MCG tablet Take 1 tablet (1,000 mcg total) by mouth daily. 30 tablet 3   divalproex (DEPAKOTE ER) 500 MG 24 hr tablet Take 1 tablet (500 mg total) by mouth daily. 90 tablet 0    guaiFENesin-dextromethorphan (ROBITUSSIN DM) 100-10 MG/5ML syrup Take 5 mLs by mouth every 4 (four) hours as needed for cough. 118 mL 0   nicotine (NICODERM CQ - DOSED IN MG/24 HOURS) 21 mg/24hr patch One 21mg  patch chest wall daily (okay to substitute generic) 28 patch 0   omeprazole (PRILOSEC) 20 MG capsule Take  20 mg by mouth daily.     terbinafine (LAMISIL) 250 MG tablet Take 250 mg by mouth daily.     tizanidine (ZANAFLEX) 2 MG capsule Take 2 mg by mouth 3 (three) times daily.     citalopram (CELEXA) 20 MG tablet Take 1 tablet (20 mg total) by mouth daily. 90 tablet 0   QUEtiapine (SEROQUEL) 50 MG tablet Take 3 tablets (150 mg total) by mouth at bedtime. 270 tablet 0   No current facility-administered medications for this visit.     Musculoskeletal: Strength & Muscle Tone: within normal limits Gait & Station: normal Patient leans: N/A  Psychiatric Specialty Exam: Review of Systems  Psychiatric/Behavioral:  Positive for dysphoric mood. Negative for agitation, behavioral problems, confusion, decreased concentration, hallucinations, self-injury, sleep disturbance and suicidal ideas. The patient is not nervous/anxious and is not hyperactive.   All other systems reviewed and are negative.   Blood pressure 117/67, pulse 84, temperature 98.2 F (36.8 C), temperature source Skin, height 5\' 2"  (1.575 m), weight 139 lb (63 kg).Body mass index is 25.42 kg/m.  General Appearance: Fairly Groomed  Eye Contact:  Good  Speech:  Clear and Coherent  Volume:  Normal  Mood:   tired  Affect:  Appropriate, Congruent, and fatigue  Thought Process:  Coherent  Orientation:  Full (Time, Place, and Person)  Thought Content: Logical   Suicidal Thoughts:  No  Homicidal Thoughts:  No  Memory:  Immediate;   Good  Judgement:  Good  Insight:  Good  Psychomotor Activity:  Normal  Concentration:  Concentration: Good and Attention Span: Good  Recall:  Good  Fund of Knowledge: Good  Language: Good   Akathisia:  No  Handed:  Right  AIMS (if indicated): not done  Assets:  Communication Skills Desire for Improvement  ADL's:  Intact  Cognition: WNL  Sleep:  Fair   Screenings: AUDIT    Flowsheet Row Admission (Discharged) from 01/02/2023 in [facility] Admission (Discharged) from 08/08/2022 in [facility]  Alcohol Use Disorder Identification Test Final Score (AUDIT) 0 0      GAD-7    Flowsheet Row Office Visit from 06/02/2023 in [facility] Office Visit from 02/05/2023 in [facility] Office Visit from 01/12/2023 in [facility] Office Visit from 12/16/2022 in [facility] Office Visit from 10/23/2022 in [facility]  Total GAD-7 Score 5 2 13 7 4       PHQ2-9    Flowsheet Row Office Visit from 06/02/2023 in [facility] Office Visit from 02/05/2023 in [facility] Office Visit from 01/12/2023 in [facility] Office Visit from 12/16/2022 in [facility] Office Visit from 10/23/2022 in [facility]  PHQ-2 Total Score 2 2 4 2  0  PHQ-9 Total Score 7 6 15 7  --      Flowsheet Row ED to Hosp-Admission (Discharged) from 01/14/2023 in [facility] Office Visit from 01/12/2023 in [facility] Admission (Discharged) from 01/02/2023 in [facility]  C-SSRS RISK CATEGORY No Risk Error: Question 6 not populated No Risk        Assessment and Plan:  OKSANA ALLES is a 67 y.o. year old female with a history of depression, psychosis, COPD, ongoing tobacco use,  DDD,  polycythemia, who is originally referred for aftercare visit. - she was admitted to [facility] in 07/2022. Per chart review, "67 year old woman who has been displaying increasingly bizarre agitated behaviors with disorganized thoughts at home which has been occurring for several weeks."   1. Altered mental status, unspecified altered mental status type 3. Cognitive and behavioral changes 4. Major depressive disorder, mild, unspecified whether recurrent (HCC) # Paranoia  R/o cognitive disorder IADL: Independent in the following: managing finances, medications, driving           Requires assistance with the following:  ADL  Independent in the following: bathing and hygiene, feeding, continence, grooming and toileting, walking          Requires assistance with the following: Folate, Vitamin B12, TSH wnl 1-01/2023 Images  Head CT 12/1022 Brain: No acute hemorrhage, mass effect or midline shift. Gray-white differentiation is preserved. No hydrocephalus. No extra-axial collection. Basilar cisterns are patent. Unchanged mild cerebellar tonsillar ectopia. Neuropsych assessment: seen at [facility] clinic- skin biopsy, LP was done  Etiology:   History- admitted in August 2023 for behavioral issues. Had similar presentation in 12/2022, IVCd. She was reportedly talking fast and tangential, and was complaining insomnia. She was admitted under the diagnosis of bipolar disorder Although both her husband and the patient denies behavioral issues,  she started to experience in worsening in anhedonia, which coincided with tapering down Depakote. Etiology of the recent episode/behavioral issues are still unclear.  Although she was diagnosed with bipolar disorder during the recent admissions, she has no known history of mood disorder except for depression until age 53, which is a highly unusual presentation.  She was seen at [facility]; while we could not obtain record, the patient denies being diagnosed with any form of  dementia. Although the hope was to taper off Depakote to avoid polypharmacy, will get back on a higher dose to see if it helps to improve her mood symptoms. However, it's important to note that this medication is not typically effective for depression or anhedonia. Will continue current dose of citalopram and quetiapine to target depression.   2. High risk medication use She was advised to obtain labs a week after taking higher dose of Depakote.  Will defer further monitoring to her new provider in the area.   The patient will be moving to weekly next week.  Yvonne Pitts expressed understanding that this writer will send 90 days of prescription.  She agrees to establish care at least with primary care in the area, and hopefully with a psychiatrist to continue her medication.    Plan Continue citalopram 20 mg daily Increase Depakote 500 mg at night  Continue quetiapine 150 mg at night  (EKG 432 msec, HR 77 12/2022) Obtain labs, one week after taking higher dose of Depakote Next appointment: N/A. Please establish care with primary care/psychiatrist in [facility] Next appointment: 8/6 at 2:30 for 30 mins, in person     The patient demonstrates the following risk factors for suicide: Chronic risk factors for suicide include: psychiatric disorder of depression . Acute risk factors for suicide include: N/A. Protective factors for this patient include: positive social support, responsibility to others (children, family), coping skills, and hope for the future. Considering these factors, the overall suicide risk at this point appears to be low. Patient is appropriate for outpatient follow up.   Collaboration of Care: Collaboration of Care: Other reviewed notes in Epic  Patient/Guardian was advised Release of Information must be obtained prior to any  record release in order to collaborate their care with an outside provider. Patient/Guardian was advised if they have not already done so to contact the registration  department to sign all necessary forms in order for [place] to release information regarding their care.   Consent: Patient/Guardian gives verbal consent for treatment and assignment of benefits for services provided during this visit. Patient/Guardian expressed understanding and agreed to proceed.    Neysa Hotter, MD 09/03/2023, 5:37 PM

## 2023-09-03 ENCOUNTER — Ambulatory Visit: Payer: Medicare HMO | Admitting: Psychiatry

## 2023-09-03 ENCOUNTER — Encounter: Payer: Self-pay | Admitting: Psychiatry

## 2023-09-03 VITALS — BP 117/67 | HR 84 | Temp 98.2°F | Ht 62.0 in | Wt 139.0 lb

## 2023-09-03 DIAGNOSIS — R4182 Altered mental status, unspecified: Secondary | ICD-10-CM | POA: Diagnosis not present

## 2023-09-03 DIAGNOSIS — R4189 Other symptoms and signs involving cognitive functions and awareness: Secondary | ICD-10-CM | POA: Diagnosis not present

## 2023-09-03 DIAGNOSIS — F320 Major depressive disorder, single episode, mild: Secondary | ICD-10-CM

## 2023-09-03 DIAGNOSIS — R4689 Other symptoms and signs involving appearance and behavior: Secondary | ICD-10-CM

## 2023-09-03 DIAGNOSIS — Z79899 Other long term (current) drug therapy: Secondary | ICD-10-CM | POA: Diagnosis not present

## 2023-09-03 MED ORDER — QUETIAPINE FUMARATE 50 MG PO TABS: 150.0000 mg | ORAL_TABLET | Freq: Every day | ORAL | 0 refills | Status: AC

## 2023-09-03 MED ORDER — CITALOPRAM HYDROBROMIDE 20 MG PO TABS: 20.0000 mg | ORAL_TABLET | Freq: Every day | ORAL | 0 refills | Status: AC

## 2023-09-03 MED ORDER — DIVALPROEX SODIUM ER 500 MG PO TB24: 500.0000 mg | ORAL_TABLET | Freq: Every day | ORAL | 0 refills | Status: AC

## 2023-09-03 NOTE — Patient Instructions (Signed)
Continue citalopram 20 mg daily Increase depakote 500 mg at night  Continue quetiapine 150 mg at night   Obtain labs Next appointment: N/A.

## 2024-03-15 LAB — COLOGUARD: COLOGUARD: NEGATIVE

## 2024-04-19 ENCOUNTER — Other Ambulatory Visit: Payer: Self-pay | Admitting: Acute Care

## 2024-04-19 DIAGNOSIS — Z122 Encounter for screening for malignant neoplasm of respiratory organs: Secondary | ICD-10-CM

## 2024-04-19 DIAGNOSIS — Z87891 Personal history of nicotine dependence: Secondary | ICD-10-CM

## 2024-04-26 ENCOUNTER — Ambulatory Visit: Admission: RE | Admit: 2024-04-26 | Source: Ambulatory Visit
